# Patient Record
Sex: Female | Born: 1985 | Race: White | Hispanic: No | Marital: Single | Smoking: Never smoker
Health system: Southern US, Community
[De-identification: ages and names within clinical notes are randomized; demographics above are authoritative.]

## PROBLEM LIST (undated history)

## (undated) DIAGNOSIS — K219 Gastro-esophageal reflux disease without esophagitis: Secondary | ICD-10-CM

## (undated) DIAGNOSIS — M542 Cervicalgia: Secondary | ICD-10-CM

## (undated) DIAGNOSIS — M5126 Other intervertebral disc displacement, lumbar region: Secondary | ICD-10-CM

## (undated) HISTORY — PX: WISDOM TOOTH EXTRACTION: SHX21

## (undated) HISTORY — DX: Other intervertebral disc displacement, lumbar region: M51.26

## (undated) HISTORY — DX: Cervicalgia: M54.2

---
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
## 2015-05-29 DIAGNOSIS — M5126 Other intervertebral disc displacement, lumbar region: Secondary | ICD-10-CM

## 2015-05-29 DIAGNOSIS — M542 Cervicalgia: Secondary | ICD-10-CM

## 2015-05-29 HISTORY — DX: Cervicalgia: M54.2

## 2015-05-29 HISTORY — DX: Other intervertebral disc displacement, lumbar region: M51.26

## 2016-10-08 ENCOUNTER — Encounter: Payer: Self-pay | Admitting: Nurse Practitioner

## 2016-10-11 ENCOUNTER — Emergency Department (HOSPITAL_COMMUNITY): Payer: BLUE CROSS/BLUE SHIELD

## 2016-10-11 ENCOUNTER — Encounter (HOSPITAL_COMMUNITY): Payer: Self-pay | Admitting: Emergency Medicine

## 2016-10-11 DIAGNOSIS — R1013 Epigastric pain: Secondary | ICD-10-CM | POA: Diagnosis present

## 2016-10-11 DIAGNOSIS — K210 Gastro-esophageal reflux disease with esophagitis: Secondary | ICD-10-CM | POA: Insufficient documentation

## 2016-10-11 LAB — CBC
HCT: 39.4 % (ref 36.0–46.0)
Hemoglobin: 13.4 g/dL (ref 12.0–15.0)
MCH: 29.3 pg (ref 26.0–34.0)
MCHC: 34 g/dL (ref 30.0–36.0)
MCV: 86.2 fL (ref 78.0–100.0)
PLATELETS: 302 10*3/uL (ref 150–400)
RBC: 4.57 MIL/uL (ref 3.87–5.11)
RDW: 13.3 % (ref 11.5–15.5)
WBC: 8.4 10*3/uL (ref 4.0–10.5)

## 2016-10-11 LAB — BASIC METABOLIC PANEL
Anion gap: 9 (ref 5–15)
BUN: 12 mg/dL (ref 6–20)
CO2: 23 mmol/L (ref 22–32)
CREATININE: 0.92 mg/dL (ref 0.44–1.00)
Calcium: 8.9 mg/dL (ref 8.9–10.3)
Chloride: 102 mmol/L (ref 101–111)
GFR calc non Af Amer: >60 mL/min (ref >60)
Glucose, Bld: 80 mg/dL (ref 65–99)
Potassium: 4.6 mmol/L (ref 3.5–5.1)
SODIUM: 134 mmol/L — AB (ref 135–145)

## 2016-10-11 LAB — I-STAT TROPONIN, ED: TROPONIN I, POC: 0 ng/mL (ref 0.00–0.08)

## 2016-10-11 LAB — I-STAT BETA HCG BLOOD, ED (MC, WL, AP ONLY): I-stat hCG, quantitative: <5 m[IU]/mL (ref <5)

## 2016-10-11 NOTE — ED Triage Notes (Signed)
Pt reports having onset of chest pain that started 10/08/16 and has progressed since then. Pt also reports shortness of breath with light exertion. Pt does report to being on birth control at this time.

## 2016-10-12 ENCOUNTER — Encounter (HOSPITAL_COMMUNITY): Payer: Self-pay

## 2016-10-12 ENCOUNTER — Emergency Department (HOSPITAL_COMMUNITY): Payer: BLUE CROSS/BLUE SHIELD

## 2016-10-12 ENCOUNTER — Emergency Department (HOSPITAL_COMMUNITY)
Admission: EM | Admit: 2016-10-12 | Discharge: 2016-10-12 | Disposition: A | Discharge status: Home / Self Care | Payer: BLUE CROSS/BLUE SHIELD | Attending: Emergency Medicine | Admitting: Emergency Medicine

## 2016-10-12 DIAGNOSIS — K2100 Gastro-esophageal reflux disease with esophagitis, without bleeding: Secondary | ICD-10-CM

## 2016-10-12 HISTORY — DX: Other intervertebral disc displacement, lumbar region: M51.26

## 2016-10-12 MED ORDER — IOPAMIDOL (ISOVUE-370) INJECTION 76%
100.0000 mL | Freq: Once | INTRAVENOUS | Status: AC | PRN
  Administered 2016-10-12: 80 mL via INTRAVENOUS

## 2016-10-12 MED ORDER — GI COCKTAIL ~~LOC~~
30.0000 mL | Freq: Once | ORAL | Status: AC
  Administered 2016-10-12: 30 mL via ORAL
  Filled 2016-10-12: qty 30

## 2016-10-12 MED ORDER — IOPAMIDOL (ISOVUE-370) INJECTION 76%
INTRAVENOUS | Status: AC
  Administered 2016-10-12: 80 mL via INTRAVENOUS
  Filled 2016-10-12: qty 100

## 2016-10-12 NOTE — ED Notes (Signed)
Patient is alert and oriented x3.  She was given DC instructions and follow up visit instructions.  Patient gave verbal understanding. She was DC ambulatory under her own power to home.  V/S stable.  He was not showing any signs of distress on DC 

## 2016-10-12 NOTE — ED Provider Notes (Signed)
[facility]-EMERGENCY DEPT Provider Note   CSN: [id number] Arrival date & time: 10/11/16  1952  By signing my name below, I, Octavia Heir, attest that this documentation has been prepared under the direction and in the presence of Karmen Altamirano, MD.  Electronically Signed: Octavia Heir, ED Scribe. 10/12/16. 2:29 AM.    History   Chief Complaint Chief Complaint  Patient presents with  . Chest Pain   The history is provided by the patient. No language interpreter was used.  Chest Pain   This is a new problem. The current episode started more than 2 days ago (4 days). The problem occurs constantly. The problem has not changed since onset.The pain is associated with exertion and walking. The pain is present in the epigastric region and lateral region. The pain is moderate. The quality of the pain is described as dull. The pain does not radiate. The symptoms are aggravated by certain positions and exertion. Pertinent negatives include no abdominal pain, no back pain, no claudication, no cough, no diaphoresis, no dizziness, no exertional chest pressure, no fever, no headaches, no hemoptysis, no irregular heartbeat, no leg pain, no lower extremity edema, no malaise/fatigue, no nausea, no near-syncope, no numbness, no orthopnea, no palpitations, no PND, no shortness of breath, no sputum production, no syncope, no vomiting and no weakness. The treatment provided no relief. Risk factors include oral contraceptive use.  Pertinent negatives for past medical history include no Marfan's syndrome and no MI.  Pertinent negatives for family medical history include: no early MI.  Procedure history is negative for stress echo.   HPI Comments: Samantha Holder is a 31 y.o. female who presents to the Emergency Department complaining of moderate, progressively worsening, upper chest pain x 4 days. She reports associated shortness of breath upon exertion. Pt further notes she feels as if there is something "stuck in her  throat". Pt was seen by [facility] on Friday for her symptoms and was prescribed Omeprazole and Carafate. Pt started the Carafate Friday but states she started the Omeprazole today. She states that laying down modifies her symptoms while ambulating and sitting up aggravates her pain. Pt notes she does not have a PMhx of hiatal hernia or GERD, but she does note that she is on medication for GERD. Pt is currently on birth control. She denies fever, leg pain, leg swelling. Pt has no other complaints.   Past Medical History:  Diagnosis Date  . Lumbar herniated disc     There are no active problems to display for this patient.   History reviewed. No pertinent surgical history.  OB History    No data available       Home Medications    Prior to Admission medications   Not on File    Family History History reviewed. No pertinent family history.  Social History Social History  Substance Use Topics  . Smoking status: Never Smoker  . Smokeless tobacco: Never Used  . Alcohol use Yes     Allergies   Patient has no known allergies.   Review of Systems Review of Systems  Constitutional: Negative for diaphoresis, fever and malaise/fatigue.  Respiratory: Negative for cough, hemoptysis, sputum production and shortness of breath.   Cardiovascular: Positive for chest pain. Negative for palpitations, orthopnea, claudication, leg swelling, syncope, PND and near-syncope.  Gastrointestinal: Negative for abdominal pain, nausea and vomiting.  Musculoskeletal: Negative for back pain.  Neurological: Negative for dizziness, weakness, numbness and headaches.  All other systems reviewed and are negative.  Physical Exam Updated Vital Signs BP 112/79 (BP Location: Right Arm)   Pulse 82   Temp 97.4 F (36.3 C) (Oral)   Resp 18   Ht  (1.6 m)   Wt 135 lb (61.2 kg)   LMP 09/27/2016   SpO2 98%   BMI 23.91 kg/m   Physical Exam  Constitutional: She is oriented to person, place, and time.  She appears well-developed and well-nourished.  HENT:  Head: Normocephalic and atraumatic.  Mouth/Throat: Oropharynx is clear and moist. No oropharyngeal exudate.  Moist mucous membranes. No exudates.   Eyes: Conjunctivae and EOM are normal. Pupils are equal, round, and reactive to light.  Neck: Normal range of motion. Neck supple. No JVD present. No tracheal deviation present.  No carotid bruits. Trachea midline.   Cardiovascular: Normal rate, regular rhythm, normal heart sounds and intact distal pulses.  Exam reveals no gallop and no friction rub.   No murmur heard. RRR.   Pulmonary/Chest: Effort normal and breath sounds normal. No stridor. No respiratory distress. She has no wheezes. She has no rales.  Lungs CTA bilaterally. No cords.  Abdominal: Soft. Bowel sounds are normal. She exhibits no distension. There is no rebound and no guarding.  Musculoskeletal: Normal range of motion. She exhibits no edema or tenderness.  Lymphadenopathy:    She has no cervical adenopathy.  Neurological: She is alert and oriented to person, place, and time. She has normal reflexes. She displays normal reflexes.  Skin: Skin is warm and dry.  Psychiatric: She has a normal mood and affect.  Nursing note and vitals reviewed.    ED Treatments / Results   Vitals:   10/11/16 2005 10/12/16 0151  BP: 107/76 112/79  Pulse: 85 82  Resp: 16 18  Temp: 97.4 F (36.3 C)     DIAGNOSTIC STUDIES: Oxygen Saturation is 98% on RA, normal by my interpretation.  COORDINATION OF CARE:  2:29 AM Discussed treatment plan with pt at bedside and pt agreed to plan.  Labs (all labs ordered are listed, but only abnormal results are displayed)  Results for orders placed or performed during the hospital encounter of 10/12/16  Basic metabolic panel  Result Value Ref Range   Sodium 134 (L) 135 - 145 mmol/L   Potassium 4.6 3.5 - 5.1 mmol/L   Chloride 102 101 - 111 mmol/L   CO2 23 22 - 32 mmol/L   Glucose, Bld 80 65 -  99 mg/dL   BUN 12 6 - 20 mg/dL   Creatinine, Ser 9.14 0.44 - 1.00 mg/dL   Calcium 8.9 8.9 - 78.2 mg/dL   GFR calc non Af Amer >60 >60 mL/min   GFR calc Af Amer >60 >60 mL/min   Anion gap 9 5 - 15  CBC  Result Value Ref Range   WBC 8.4 4.0 - 10.5 K/uL   RBC 4.57 3.87 - 5.11 MIL/uL   Hemoglobin 13.4 12.0 - 15.0 g/dL   HCT 95.6 21.3 - 08.6 %   MCV 86.2 78.0 - 100.0 fL   MCH 29.3 26.0 - 34.0 pg   MCHC 34.0 30.0 - 36.0 g/dL   RDW 57.8 46.9 - 62.9 %   Platelets 302 150 - 400 K/uL  I-stat troponin, ED  Result Value Ref Range   Troponin i, poc 0.00 0.00 - 0.08 ng/mL   Comment 3          I-Stat Beta hCG blood, ED ([facility], [facility], [facility] only)  Result Value Ref Range   I-stat  hCG, quantitative <5.0 <5 mIU/mL   Comment 3           Dg Chest 2 View  Result Date: 10/11/2016 CLINICAL DATA:  Progressively worse generalized chest pain EXAM: CHEST  2 VIEW COMPARISON:  None. FINDINGS: The heart size and mediastinal contours are within normal limits. Both lungs are clear. Minimal scoliosis. IMPRESSION: No active cardiopulmonary disease. Electronically Signed   By: Jasmine Pang M.D.   On: 10/11/2016 20:53   Ct Angio Chest Pe W And/or Wo Contrast  Result Date: 10/12/2016 CLINICAL DATA:  Upper chest pain for 4 days. Shortness of breath with exertion. Patient is on birth control pills. Recent long car trip. EXAM: CT ANGIOGRAPHY CHEST WITH CONTRAST TECHNIQUE: Multidetector CT imaging of the chest was performed using the standard protocol during bolus administration of intravenous contrast. Multiplanar CT image reconstructions and MIPs were obtained to evaluate the vascular anatomy. CONTRAST:  80 mL Isovue 370 COMPARISON:  None. FINDINGS: Cardiovascular: Satisfactory opacification of the pulmonary arteries to the segmental level. No evidence of pulmonary embolism. Normal heart size. No pericardial effusion. Normal caliber thoracic aorta. No dissection. Mediastinum/Nodes: No enlarged mediastinal, hilar, or axillary lymph  nodes. Thyroid gland, trachea, and esophagus demonstrate no significant findings. Lungs/Pleura: Lungs are clear. No pleural effusion or pneumothorax. Upper Abdomen: No acute abnormality. Musculoskeletal: No acute bony abnormalities. Benign-appearing sclerosis in a lower thoracic vertebra. Review of the MIP images confirms the above findings. IMPRESSION: No evidence of significant pulmonary embolus. No evidence of active pulmonary disease. Electronically Signed   By: Burman Nieves M.D.   On: 10/12/2016 04:40    EKG  EKG Interpretation  Date/Time:  Monday Jerard Bays 16 2018 20:06:14 EDT Ventricular Rate:  73 PR Interval:    QRS Duration: 89 QT Interval:  376 QTC Calculation: 415 R Axis:   84 Text Interpretation:  Sinus rhythm Confirmed by [facility]  MD, Shianne Zeiser ([id number]) on 10/12/2016 2:00:27 AM       Radiology Dg Chest 2 View  Result Date: 10/11/2016 CLINICAL DATA:  Progressively worse generalized chest pain EXAM: CHEST  2 VIEW COMPARISON:  None. FINDINGS: The heart size and mediastinal contours are within normal limits. Both lungs are clear. Minimal scoliosis. IMPRESSION: No active cardiopulmonary disease. Electronically Signed   By: Jasmine Pang M.D.   On: 10/11/2016 20:53    Procedures Procedures (including critical care time)  Medications Ordered in ED Medications  gi cocktail (Maalox,Lidocaine,Donnatal) (30 mLs Oral Given 10/12/16 0349)  iopamidol (ISOVUE-370) 76 % injection 100 mL (80 mLs Intravenous Contrast Given 10/12/16 0418)     Final Clinical Impressions(s) / ED Diagnoses   Final diagnoses:  None   This is a 31 y.o. -year-old female presents with chest pain and sensation that something is stuck in the throat.  The patient is nontoxic-appearing on exam and vital signs are within normal limits.  Ruled out for MI in the ED with heart score of 0 and ruled out for PE in the ED.   I suspect GERD with esophagitis.  Already on carafate, and just started on omeprazole.  Ruled out  for MI in the Ed with 2 negative troponins in the setting of ongoing symptoms.  Exam, labs and vitals are benign and reassuring. The patient is nontoxic-appearing and imaging is negative for acute finding. Return for DOE, SOB at rest, diaphoresis,  intractable pain, vomiting, or any concerns.   After history, exam, and medical workup I feel the patient has been appropriately medically screened and is safe for  discharge home. Pertinent diagnoses were discussed with the patient. Patient was given return precautions.   I personally performed the services described in this documentation, which was scribed in my presence. The recorded information has been reviewed and is accurate.      Cy Blamer, MD 10/12/16 [phone]

## 2016-10-19 ENCOUNTER — Ambulatory Visit (INDEPENDENT_AMBULATORY_CARE_PROVIDER_SITE_OTHER): Payer: BLUE CROSS/BLUE SHIELD | Admitting: Nurse Practitioner

## 2016-10-19 ENCOUNTER — Encounter: Payer: Self-pay | Admitting: Nurse Practitioner

## 2016-10-19 VITALS — BP 118/76 | HR 87 | Ht 63.0 in | Wt 136.6 lb

## 2016-10-19 DIAGNOSIS — R0789 Other chest pain: Secondary | ICD-10-CM

## 2016-10-19 DIAGNOSIS — K219 Gastro-esophageal reflux disease without esophagitis: Secondary | ICD-10-CM | POA: Diagnosis not present

## 2016-10-19 NOTE — Patient Instructions (Addendum)
If you are age 30 or older, your body mass index should be between 23-30. Your Body mass index is 24.2 kg/m. If this is out of the aforementioned range listed, please consider follow up with your Primary Care Provider.  If you are age 15 or younger, your body mass index should be between 19-25. Your Body mass index is 24.2 kg/m. If this is out of the aformentioned range listed, please consider follow up with your Primary Care Provider.   Continue current medications.  If chest tightness persist after 7-10 days, increase Omeprazole to twice daily before meals.  You have been given GERD literature.  Call with an update the first week of May.  Thank you for choosing me and [facility].  Charyl Dancer, NP

## 2016-10-19 NOTE — Progress Notes (Addendum)
HPI:  Samantha Holder is a 31 year old female, new to this practice, here for evaluation of GERD type symptoms. Two weeks ago she presented to the ED with complaints of nausea, chest tightness, mild SOB. She was given omeprazole once daily and Carafate 4 times a day. She returned a few days later when symptoms failed to improve. At the second visit patient was referred to the ED,  cardiac etiology excluded and patient was advised to see a gastroenterologist. ED CBC, chem profile were unremarkable. Normal troponin. CT Angio of the chest was normal.  No acute findings on EKG. She has been on the PPI + carafate for nearly 2 weeks and the nausea is better. She was having some dysphagia to water and medications but that is better as well. She still has chest tightness and feels like something is stuck in her throat. No heartburn.  She is eating and drinking, weight is stable. She has occasional nonproductive cough. Patient has no history of asthma or other pulmonary problems. Hasn't noticed any wheezing  Bernard does have occasional constipation. She recently added Benefiber to her diet   Past Medical History:  Diagnosis Date  . Lumbar herniated disc     Past Surgical History:  Procedure Laterality Date  . WISDOM TOOTH EXTRACTION     Family History  Problem Relation Age of Onset  . Breast cancer Maternal Grandmother    Social History  Substance Use Topics  . Smoking status: Never Smoker  . Smokeless tobacco: Never Used  . Alcohol use Yes   Current Outpatient Prescriptions  Medication Sig Dispense Refill  . Norgestimate-Ethinyl Estradiol Triphasic (TRI-SPRINTEC) 0.18/0.215/0.25 MG-35 MCG tablet Take 1 tablet by mouth daily.    Marland Kitchen omeprazole (PRILOSEC) 20 MG capsule Take 20 mg by mouth daily.    . sucralfate (CARAFATE) 1 g tablet Take 1 g by mouth 4 (four) times daily.     No current facility-administered medications for this visit.    No Known Allergies   Review of Systems: All systems  reviewed and negative except where noted in HPI.    Physical Exam: BP 118/76   Pulse 87   Ht  (1.6 m)   Wt 136 lb 9.6 oz (62 kg)   LMP 09/27/2016 Comment: negative beta HCG 10/11/16  SpO2 98%   BMI 24.20 kg/m  Constitutional:  Well-developed, white female in no acute distress. Psychiatric: Normal mood and affect. Behavior is normal. HEENT: Normocephalic and atraumatic. Conjunctivae are normal. No scleral icterus. Neck supple.  Cardiovascular: Normal rate, regular rhythm.  Pulmonary/chest: Effort normal and breath sounds normal. No wheezing, rales or rhonchi. Abdominal: Soft, nondistended, nontender. Bowel sounds active throughout. There are no masses palpable. No hepatomegaly. Extremities: no edema Lymphadenopathy: No cervical adenopathy noted. Neurological: Alert and oriented to person place and time. Skin: Skin is warm and dry. No rashes noted.   ASSESSMENT AND PLAN:  21. 31 year old female with a 3 week history of chest pressure/tightness, globus, and mild dysphagia to water and medication. Seen in ED, labs and CTA chest negative. With exception of chest tightness her symptoms have improved on once daily PPI and 3 times a day Carafate. Chest tightness is worse with eating but also exercise. Her symptoms may be GERD related. No history of asthma or other pulmonary disease,   -We discussed options such as watch and wait on medication approach for a couple more weeks versus barium swallow to rule out any strictures vrs  EGD. Patient has only catastrophic  insurance until July 1 so would like to hold off on any procedures if possible. Unfortunately she is moving to [place] the end of May however -Plan is to continue PPI and Carafate for now. Patient will call me if symptoms acutely worsen. Otherwise she will call me the first week of May with a condition update.   2. Mild constipation. -Recently started Benefiber, recommend she continue it on a daily basis  Willette Cluster, NP   10/19/2016, 8:59 AM   Addendum: Reviewed and agree with initial management. Beverley Fiedler, MD

## 2017-02-17 ENCOUNTER — Encounter: Payer: Self-pay | Admitting: Family Medicine

## 2017-02-17 NOTE — Progress Notes (Unsigned)
Nothing found in Care Everywhere. I placed a call to the patient, used 3 identifiers.  Patient will to bring all medications, vitamins and herbal supplements that they are taking to your appointment.  Leroy Libman, LVN

## 2017-02-21 ENCOUNTER — Encounter: Payer: Self-pay | Admitting: Family Medicine

## 2017-02-21 ENCOUNTER — Ambulatory Visit: Payer: Commercial Managed Care - HMO | Admitting: Family Medicine

## 2017-02-21 ENCOUNTER — Ambulatory Visit: Payer: Commercial Managed Care - HMO

## 2017-02-21 VITALS — BP 90/66 | HR 94 | Temp 97.1°F | Ht 63.0 in | Wt 134.7 lb

## 2017-02-21 DIAGNOSIS — Z3041 Encounter for surveillance of contraceptive pills: Secondary | ICD-10-CM

## 2017-02-21 DIAGNOSIS — K2950 Unspecified chronic gastritis without bleeding: Secondary | ICD-10-CM | POA: Insufficient documentation

## 2017-02-21 MED ORDER — NORGESTIMATE-ETHINYL ESTRADIOL 0.18MG/0.215MG/0.25MG-0.035MG(28)TABLET
1.0000 | ORAL_TABLET | Freq: Every day | ORAL | 6 refills | Status: DC
Start: 2017-02-21 — End: 2017-08-22

## 2017-02-21 MED ORDER — OMEPRAZOLE 20 MG CAPSULE,DELAYED RELEASE
20.0000 mg | DELAYED_RELEASE_CAPSULE | Freq: Every day | ORAL | 1 refills | Status: AC
Start: 2017-02-21 — End: 2017-03-23

## 2017-02-21 NOTE — Patient Instructions (Addendum)
H. Pylori: About This Test  What is it?  H. pylori tests are used to check for a Helicobacter pylori bacteria infection in the stomach and upper part of the small intestine. H. pylori can cause peptic ulcers. But most people with this type of bacteria in their digestive systems do not get ulcers.  Different tests may be used to check for an H. pylori infection.   Blood antibody test. This checks to see if your body has made antibodies to fight H. pylori bacteria.   Urea breath test. It tests your breath to see if you have H. pylori bacteria in your stomach.   Stool antigen test. This test looks for substances in your feces (stool) that trigger the immune system to fight an H. pylori infection. (These substances are called H. pylori antigens.)   Stomach biopsy. A small sample (biopsy) is taken from the lining of your stomach and small intestine. The samples are checked for H. pylori.  Why is this test done?  H. pylori tests are done to:   Find out if an infection with H. pylori bacteria may be causing an ulcer or irritation of the stomach lining (gastritis).   Find out if treatment for the infection has been successful.  How can you prepare for the test?  Blood antibody test   You do not need to do anything before you have this test.  Urea breath test, stool antigen test, or stomach biopsy   Medicines you take may change the results of these tests. Be sure to tell your doctor about all the prescription and over-the-counter medicines you take. Your doctor may advise you to stop taking some of your medicines.  Urea breath test or stomach biopsy   You will be asked to not eat or drink anything for a certain amount of time before your breath test or stomach biopsy. Follow your doctor's instructions about how long you need to avoid eating and drinking before the test. If you are going to have a stomach biopsy, your doctor will give you instructions on how to prepare.  What happens during the test?  Blood  antibody test   A health professional takes a sample of your blood.  Urea breath test  A breath sample is collected when you blow into a balloon or blow bubbles into a bottle of liquid. The health professional will:   Collect a sample of your breath before the test starts.   Give you a capsule or some water to swallow that contains tagged or radioactive material.   Collect more samples of your breath. The samples will be tested to see if they contain material formed when H. pylori comes into contact with the tagged or radioactive material.  Stool antigen test  For this test, you may be asked to collect the stool sample at home. To collect the sample, you need to:   Pass stool into a dry container. Either solid or liquid stools can be collected. Be careful not to get urine or toilet tissue in with the stool sample.   Replace the container cap. Label the container with your name, your doctor's name, and the date the sample was collected.   Wash your hands well after you collect the sample.   Take the sealed container to your doctor's office or to the lab as soon as you can.  Stomach biopsy   A procedure called endoscopy is used to collect samples of tissue from the stomach and the first part of   the small intestine. The tissue samples are tested in a lab to see if they contain H. pylori.  Follow-up care is a key part of your treatment and safety. Be sure to make and go to all appointments, and call your doctor if you are having problems. It's also a good idea to keep a list of the medicines you take. Ask your doctor when you can expect to have your test results.  Where can you learn more?  Go to https://www.healthwise.net/patientEd.  Enter L549 in the search box to learn more about "H. Pylori: About This Test."  Current as of: April 11, 2015  Content Version: 11.4   2006-2017 [employer]. Care instructions adapted under license by your healthcare professional. If you have questions about a  medical condition or this instruction, always ask your healthcare professional. Healthwise, Inc[employer] disclaims any warranty or liability for your use of this information.

## 2017-02-21 NOTE — Progress Notes (Signed)
CC: "I need GI referral for endoscopy"    HPI: C/o frequent eructations for about 2 years, substernal tightness and irritation. Previously unrelated to diet and timing with eating/drinking. Eating smaller meals reduces this pain/sensation. Now is noticing chocolate, coffee make symptoms worse. Denies history of esophageal or stomach burning, N/V/D. Has raised head of bed and avoids eating 5 hours before bed. With modifications, symptoms have improved, but still bothersome.     Had one episode of throat tightening sensation while eating an apple a few months ago. Next morning went to urgent care and started on sucralfate and PPI. Later in evening had exertional dyspnea and chest pressure. 2 days later went to urgent care and got EKG> ER and "all ruled out"  GI consult subsequently, told to stay on meds for 1 month and consider EGD when insurance changes.  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     Review of Systems   Constitutional: Negative for fever and weight loss.   HENT: Negative for sore throat.    Respiratory: Negative for cough and shortness of breath.    Cardiovascular: Negative for chest pain and palpitations.   Gastrointestinal: Positive for heartburn. Negative for abdominal pain, blood in stool, diarrhea, nausea and vomiting.       OBJECTIVE  Temp: 36.2 C (97.1 F) (08/27 1505)  Temp src: Tympanic (08/27 1505)  Pulse: 94 (08/27 1505)  BP: 90/66 (08/27 1505)  Resp: --  SpO2: 96 % (08/27 1505)  Height: 160 cm (5\' 3" ) (08/27 1505)  Weight: 61.1 kg (134 lb 11.2 oz) (08/27 1505)  General: Well-developed, well-nourished, no acute distress  HEENT: NCAT, PERRL, EOMI, dentition is in good repair, moist mucous membranes, oropharynx clear, no difficulty swallowing, no obvious facial deformities  Neck: Supple, no cervical lymphadenopathy, no thyroid enlargement  CV: RRR, normal S1 S2, no murmur appreciated  Resp: lungs clear to auscultation bilaterally, no w/r/r,  breathing comfortably on room air  Abd: soft, non-tender, non-distended, normoactive bowel sounds  Ext: no deformities, no edema  Skin: warm and dry  Neuro: A&Ox4    ASSESSMENT/PLAN  Lesile Niederer is a 31yr old female with the following assessments addressed today:  1. Chronic gastritis, presence of bleeding unspecified, unspecified gastritis type  - Omeprazole (PRILOSEC) 20 mg Delayed Release Capsule; Take 1 capsule by mouth once daily before a meal.  Dispense: 30 capsule; Refill: 1  - HELICOBACTER PYLORI ANTIGEN; Future  - We will wait for results and if negative send GI referral.  If positive will treat.   2. Encounter for surveillance of contraceptive pills  - Counseled patient to establish care with new PCP. Obtain records from Completed one year ago   - Norgestimate-Ethinyl Estradiol (ORTHO TRI-CYCLEN) 0.18/0.215/0.25 mg-35 mcg (28) Tablet; Take 1 tablet by mouth every morning.  Dispense: 28 tablet; Refill: 6    Patient endorsed understanding and is in agreement with plan of care.    Follow-up as needed    Zada Girt, DO  Associate Physician  [facility] Psychiat[facility]    This note was created using the support of [employer]. Please note any grammatical, sound-alike, or syntax errors as likely dictation errors.

## 2017-02-21 NOTE — Nursing Note (Signed)
Patient ID confirmed using 2 identifiers.    Vital signs taken, allergies verified, screened for pain, and med history taken.      Mammography screening for females between 50-30 years of age reviewed as appropriate.    Flu Vaccine status reviewed and updated as appropriate.     My Chart status reviewed and updated as appropriate.    I have asked the patient if they have received any medical treatment/consultations outside of the [facility] within the past 30 days. The patient has indicated no to receiving services outside of the [facility]. If yes, I have requested information to ascertain these results.     Sandralee Tarkington, MA

## 2017-04-19 ENCOUNTER — Other Ambulatory Visit: Payer: Self-pay | Admitting: Family Medicine

## 2017-04-19 DIAGNOSIS — K2950 Unspecified chronic gastritis without bleeding: Principal | ICD-10-CM

## 2017-04-19 NOTE — Telephone Encounter (Signed)
Patient last office visit on 02/21/17  Last filled 02/21/17

## 2017-04-20 NOTE — Telephone Encounter (Signed)
Left message on voicemail for patient to call back. 

## 2017-04-20 NOTE — Telephone Encounter (Signed)
Patient notified.   She still needs a refill for one month supply and she will do the test next week.

## 2017-04-20 NOTE — Telephone Encounter (Signed)
Patient needs to complete h.pylori testing for chronic gastritis first. We want to diagnose why you are having these symptoms.

## 2017-04-21 NOTE — Telephone Encounter (Signed)
Left message reiterating that it is necessary to complete h.pylori testing before continuing with omeprazole to make sure we are not doing more harm than good. Asked patient to complete lab and I will refill as indicated once result is available.   Patient was seen 2 mo prior for chronic gastritis on long-term PPI therapy without EGD or h.pylori testing. Patient's chief complaint at the time was to ask for GI referral for EGD. We cannot move forward without r/o h.pylori, and stool antigen testing is not reliable without a 2 week break from PPI. Additionally, long-term PPI without diagnosis is contraindicated in otherwise healthy patient and may impair nutrient absorption.

## 2017-06-02 ENCOUNTER — Ambulatory Visit: Payer: Commercial Managed Care - HMO | Attending: Family Medicine

## 2017-06-02 DIAGNOSIS — K2950 Unspecified chronic gastritis without bleeding: Principal | ICD-10-CM | POA: Insufficient documentation

## 2017-06-10 LAB — HELICOBACTER PYLORI ANTIGEN: HELICOBACTER PYLORI ANTIGEN: NEGATIVE

## 2017-06-13 ENCOUNTER — Telehealth: Payer: Self-pay | Admitting: Family Medicine

## 2017-06-13 DIAGNOSIS — K2950 Unspecified chronic gastritis without bleeding: Principal | ICD-10-CM

## 2017-06-13 NOTE — Telephone Encounter (Signed)
H.pylori negative. Chronic gastritis, will refer to GI for possible EGD as discussed at last visit

## 2017-06-14 NOTE — Telephone Encounter (Signed)
Left message on unidentifed voicemail for patient to call clinic back and ask to speak to the advise nurse to get the message from their doctor.    ATTN STAFF:  When patient calls back please transfer patient to advise nurse

## 2017-06-14 NOTE — Telephone Encounter (Signed)
3 patient identifiers used by [name] is a [age]yr old female    Disposition: Consult with MD  I reviewed results and message from MD with patient. Questions were answered and patient would like to know if the referral could be changed to the [facility] as the patient lives in that area.    Thank you  Ivor MessierLisa Attles Shaquala Broeker  PCN Triage Nurse

## 2017-07-28 ENCOUNTER — Telehealth: Payer: Self-pay | Admitting: GASTROENTEROLOGY

## 2017-07-28 NOTE — Telephone Encounter (Signed)
Pre procedure follow up call :  3 patient identifications obtained.  Patient confirmed appointment date and time and aware must have a driver to pick up after procedure. All questions answered and verbalized understanding. Informed  patient to call [facility] if any further questions between the hours of 8am to 4:30pm.    Janet Fernandez Senior LVN

## 2017-07-29 ENCOUNTER — Encounter: Payer: Self-pay | Admitting: Gastroenterology

## 2017-08-05 ENCOUNTER — Ambulatory Visit: Payer: Commercial Managed Care - HMO | Admitting: GASTROENTEROLOGY

## 2017-08-22 ENCOUNTER — Other Ambulatory Visit: Payer: Self-pay | Admitting: Family Medicine

## 2017-08-22 DIAGNOSIS — Z3041 Encounter for surveillance of contraceptive pills: Principal | ICD-10-CM

## 2017-08-22 NOTE — Telephone Encounter (Signed)
Pharmacy asking for 90 day refill.

## 2017-08-23 MED ORDER — NORGESTIMATE-ETHINYL ESTRADIOL 0.18MG/0.215MG/0.25MG-0.035MG(28)TABLET
1.0000 | ORAL_TABLET | Freq: Every day | ORAL | 0 refills | Status: DC
Start: 2017-08-23 — End: 2017-12-14

## 2017-08-24 ENCOUNTER — Telehealth: Payer: Self-pay | Admitting: GASTROENTEROLOGY

## 2017-08-24 NOTE — Telephone Encounter (Signed)
Pre procedure follow up call :  3 pt identifications verified , Pt confirmed appointment,  aware must have a driver  . All questions answered and verbalized understanding  about the procedure no further questions asked.  For any further questions,concerns, or need to cancel appointment advise to call back [phone] Monday to Friday 8am to 4pm .  After hours to call [phone] and ask for the GI Fellow on-call.    Ivanna Kocak, LVN  [facility]

## 2017-08-26 ENCOUNTER — Encounter: Payer: Self-pay | Admitting: GASTROENTEROLOGY

## 2017-08-26 ENCOUNTER — Ambulatory Visit
Admission: RE | Admit: 2017-08-26 | Discharge: 2017-08-26 | Disposition: A | Discharge status: Home / Self Care | Payer: Commercial Managed Care - HMO | Source: Ambulatory Visit | Attending: GASTROENTEROLOGY | Admitting: GASTROENTEROLOGY

## 2017-08-26 DIAGNOSIS — R1310 Dysphagia, unspecified: Principal | ICD-10-CM | POA: Insufficient documentation

## 2017-08-26 HISTORY — DX: Gastro-esophageal reflux disease without esophagitis: K21.9

## 2017-08-26 MED ORDER — PROMETHAZINE 25 MG/ML INJECTION SOLUTION
6.2500 mg | INTRAMUSCULAR | Status: AC | PRN
Start: 2017-08-26 — End: 2017-08-26
  Administered 2017-08-26: 6.25 mg via INTRAVENOUS
  Filled 2017-08-26: qty 1

## 2017-08-26 MED ORDER — FENTANYL (PF) 50 MCG/ML INJECTION SOLUTION
12.5000 ug | INTRAMUSCULAR | Status: DC | PRN
Start: 2017-08-26 — End: 2017-09-01
  Administered 2017-08-26: 100 ug via INTRAVENOUS
  Filled 2017-08-26: qty 4

## 2017-08-26 MED ORDER — EPINEPHRINE 0.1 MG/ML INJECTION SYRINGE
1.0000 mg | INJECTION | INTRAMUSCULAR | Status: DC | PRN
Start: 2017-08-26 — End: 2017-09-01

## 2017-08-26 MED ORDER — FLUMAZENIL 0.1 MG/ML INTRAVENOUS SOLUTION
0.2000 mg | INTRAVENOUS | Status: DC | PRN
Start: 2017-08-26 — End: 2017-09-01

## 2017-08-26 MED ORDER — MIDAZOLAM (PF) 1 MG/ML INJECTION SOLUTION
0.5000 mg | INTRAMUSCULAR | Status: DC | PRN
Start: 2017-08-26 — End: 2017-09-01
  Administered 2017-08-26: 5 mg via INTRAVENOUS
  Filled 2017-08-26: qty 10

## 2017-08-26 MED ORDER — EPINEPHRINE 0.1 MG/ML INJECTION SYRINGE
0.3000 mg | INJECTION | INTRAMUSCULAR | Status: DC | PRN
Start: 2017-08-26 — End: 2017-09-01

## 2017-08-26 MED ORDER — NACL 0.9% IV INFUSION
INTRAVENOUS | Status: DC
Start: 2017-08-26 — End: 2017-09-01
  Administered 2017-08-26: 16:00:00 via INTRAVENOUS

## 2017-08-26 MED ORDER — LIDOCAINE HCL 2 % MUCOSAL SOLUTION
5.0000 mL | Freq: Once | Status: AC
Start: 2017-08-26 — End: 2017-08-26
  Administered 2017-08-26: 5 mL via OROMUCOSAL
  Filled 2017-08-26: qty 15

## 2017-08-26 MED ORDER — ATROPINE 0.1 MG/ML INJECTION SYRINGE
0.5000 mg | INJECTION | INTRAMUSCULAR | Status: DC | PRN
Start: 2017-08-26 — End: 2017-09-01

## 2017-08-26 MED ORDER — SIMETHICONE 40 MG/0.6 ML ORAL DROPS,SUSPENSION
40.0000 mg | Freq: Once | ORAL | Status: AC
Start: 2017-08-26 — End: 2017-08-26
  Administered 2017-08-26: 40 mg
  Filled 2017-08-26: qty 0.6

## 2017-08-26 MED ORDER — DIPHENHYDRAMINE 50 MG/ML INJECTION SOLUTION
12.5000 mg | INTRAMUSCULAR | Status: DC | PRN
Start: 2017-08-26 — End: 2017-09-01
  Filled 2017-08-26: qty 1

## 2017-08-26 MED ORDER — NALOXONE 0.4 MG/ML INJECTION SOLUTION
0.4000 mg | INTRAMUSCULAR | Status: DC | PRN
Start: 2017-08-26 — End: 2017-09-01

## 2017-08-26 NOTE — Discharge Instructions (Signed)
Discharge Instructions for upper endoscopy    Findings:  - Normal appearing esophagus without acid reflux changes. Biopsies obtained.   - Normal appearing stomach.  - Normal appearing duodenum.     Activity:    Rest today, resume usual activitiy tomorrow     Diet:    Resume usual diet:     Medication:    Resume all usual medications    Follow up:   We will notify you of your biopsy results via phone call or letter.If you do not receive notice of your results by three weeks, please call us at the phone number provided below.      During your procedure, air was pumped into your GI tract so your doctor could see clearly to make a diagnosis and/or treat your problem.     Some possible side effects you may experience are:   - Discomfort due to a distended (bloated) abdomen which will subside after a few hours to two days.   - Nausea may be a side effect of the medication and will subside.   - The medications you received may make you dizzy and sleepy, it is important that you do not drive, operate machinery or drink alcohol for at least one day.   - Severe pain is not expected and should be reported    Other side effects may include:  Upper Endoscopy: A sore throat can be helped by cough drops, or a salt-water gargle.    If problems, call [facility] [phone] during business hours Monday through Friday 7:30am to 4:30 pm.  After hours call [phone] and ask to speak to the GI Fellow on call.
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
## 2017-08-26 NOTE — Procedures (Signed)
Pre-Procedure Time Out Checklist  (do not remove)     Attestation:  ID verified by two sources (select any two from list): DOB and Name  Was this an emergency procedure?  no     I attest that I verified the following information prior to performing the procedure: Site and Procedure     Patient Name:    Janet Fernandez  MRN: [id number]   DOB: 12/22/1985  DOS: 08/26/2017      Procedure: Upper Endoscopy  Sub-procedure: Biopsy       Referring Physician: Emmit PomfretPierce, Laura Jeanne, DO    PERFORMING SURGEON: Phil DoppJoseph Amarius Toto, MD    ASSISTANT SURGEON: None     GI Pre-procedure Indications:  dysphagia      Sedation:   Sedation was administered for single procedure/procedures  Versed 5 mg IV, Fentanyl 100 mcg IV and Phenergan 6.25 mg IV     Sedation Start Time: 15:14    I was the responsible provider supervising the administration of moderate sedation to this patient by a trained independent provider (RN). I was present during the interservice time from initial administration of sedatives until the end of the procedure at the time noted above.      Procedure Time:  Time start: 15:33  Time end: 15:42      Details of the Procedure:    Informed consent was obtained for the procedure, including sedation. Risks of perforation, hemorrhage, adverse drug reaction, pain, infection and aspiration were discussed. The patient was placed in the left lateral decubitus position. Based on the pre-procedure assessment, including review of the patient's medical history, medications, allergies, and review of systems, the patient had been deemed to be an appropriate candidate for conscious sedation; the patient was therefore sedated with the medications listed below. The patient was monitored continuously with EKG tracing, pulse oximetry, blood pressure monitoring, and direct observations.     An oral examination was performed. The Olympus endoscope was inserted into the mouth and advanced under direct vision to the second portion of the duodenum.   A careful  inspection was made as the gastroscope was withdrawn, including a retroflexed view of the proximal stomach; findings and interventions are described below. Appropriate photo documentation was obtained. The patient tolerated the procedure well, and there were no complications. The patient was taken to the recovery area in stable condition.       Findings and Interventions:   Esophageal mucosa: Normal appearing mucosa s/p mid and distal biopsies to assess for EoE given dysphagia.   Esophageal-gastric junction localized at 38 cm.  Squamo-columnar junction regular  Hiatal hernia absent  Gastric mucosa: Bile present in the stomach but otherwise normal appearing mucosa.   Duodenal mucosa: Normal appearing mucosa.   Retroflexed view: Normal appearing mucosa.       Impression:   1. Normal appearing esophagus without reflux changes. S/p biopsies to assess for eosinophilic esophagitis.   2. Normal appearing stomach with bile present. Given presence of bile, symptoms of heartburn could be related to bile acid reflux.   3. Normal appearing duodenum.     Recommendation/Plan:   - Await pathology results  - Continue Omeprazole. Can consider adding liquid sucralfate 1 g three times daily if symptoms do not resolve with PPI.   - Follow up with referring provider.       Report Electronically Signed By:     Phil DoppJoseph Jamirra Curnow, MD  Assistant Professor   Division of Gastroenterology/Hepatology  Pager: [phone]  PI #: [id number]

## 2017-08-30 LAB — SURGICAL PATHOLOGY

## 2017-08-31 ENCOUNTER — Encounter: Payer: Self-pay | Admitting: GASTROENTEROLOGY

## 2017-12-14 ENCOUNTER — Other Ambulatory Visit: Payer: Self-pay | Admitting: Family Medicine

## 2017-12-14 DIAGNOSIS — Z3041 Encounter for surveillance of contraceptive pills: Principal | ICD-10-CM

## 2017-12-14 NOTE — Telephone Encounter (Signed)
Last RF 08/23/17  Last OV 02/21/17  Sherrie MustacheSharon Elverda Fernandez, RMA

## 2018-02-18 IMAGING — CT CT ANGIO CHEST
2 of 6 series · 19 of 36 positions shown · IV contrast (ISOVUE 370)
Comparison: None.

CLINICAL DATA: Upper chest pain for 4 days. Shortness of breath
with exertion. Patient is on birth control pills. Recent long car
trip.

EXAM:
CT ANGIOGRAPHY CHEST WITH CONTRAST
TECHNIQUE: Multidetector CT imaging of the chest was performed using the
standard protocol during bolus administration of intravenous
contrast. Multiplanar CT image reconstructions and MIPs were
obtained to evaluate the vascular anatomy.
CONTRAST:  80 mL Isovue 370

[Series 6: thins for pacs · axial · 0.65mm/px · z∈[+45,+248]mm · 18 of 227 slices shown]
[im 12/227  lung]
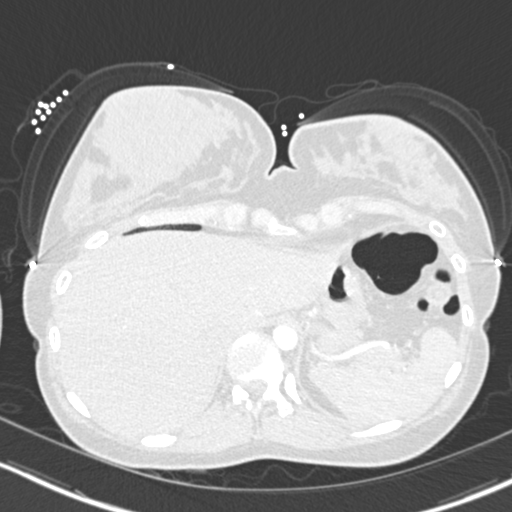
[im 23/227  mediastinal]
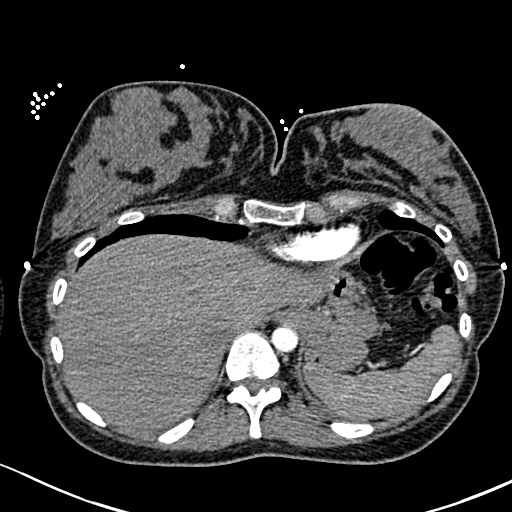
[im 34/227  lung]
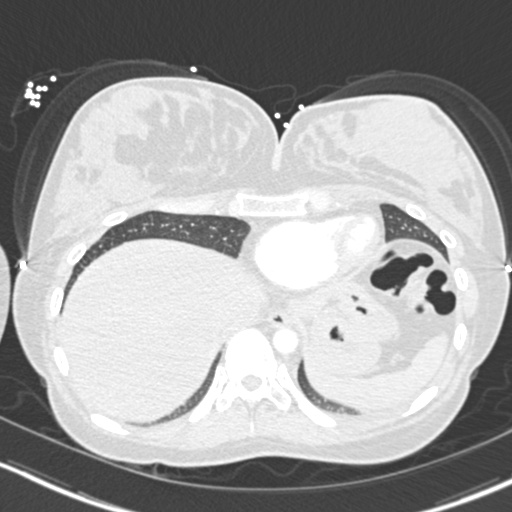
[im 46/227  mediastinal]
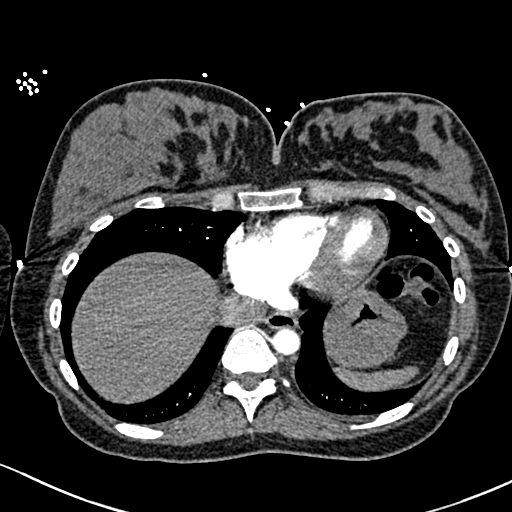
[im 57/227  lung]
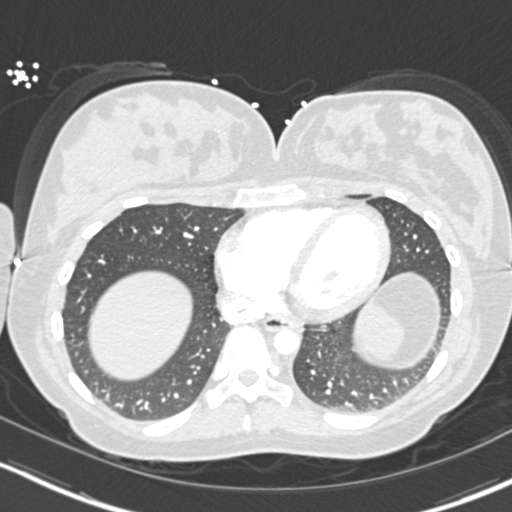
[im 68/227  mediastinal]
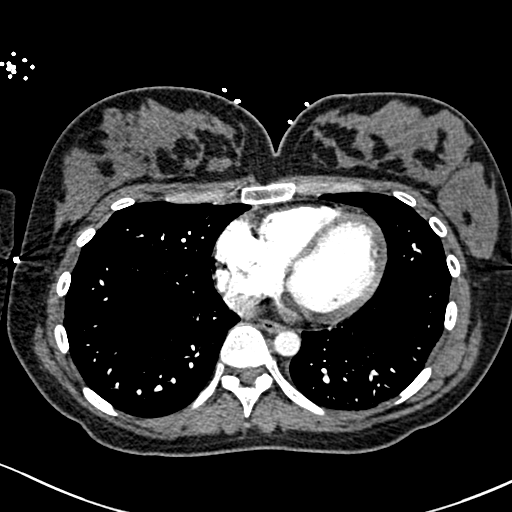
[im 80/227  lung]
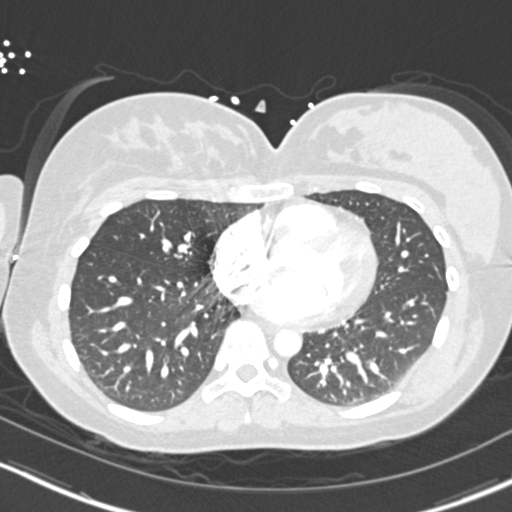
[im 91/227  mediastinal]
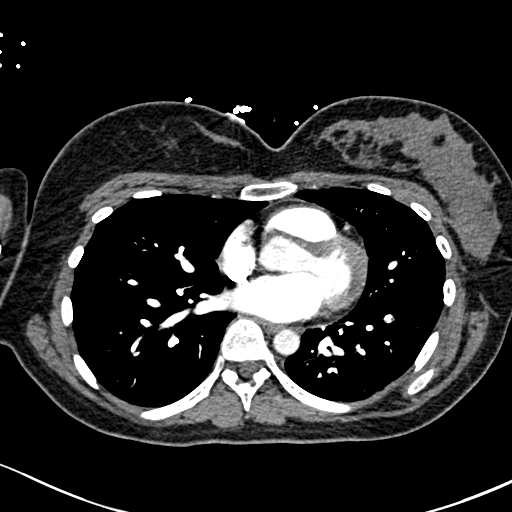
[im 102/227  lung]
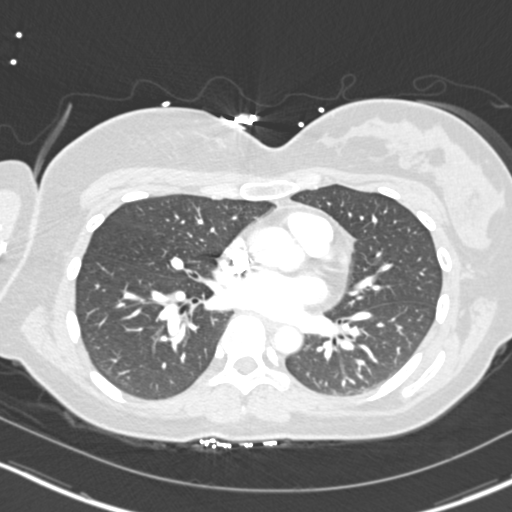
[im 125/227  mediastinal]
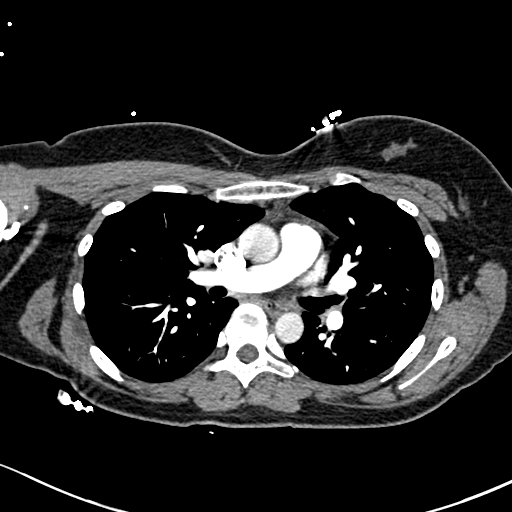
[im 136/227  lung]
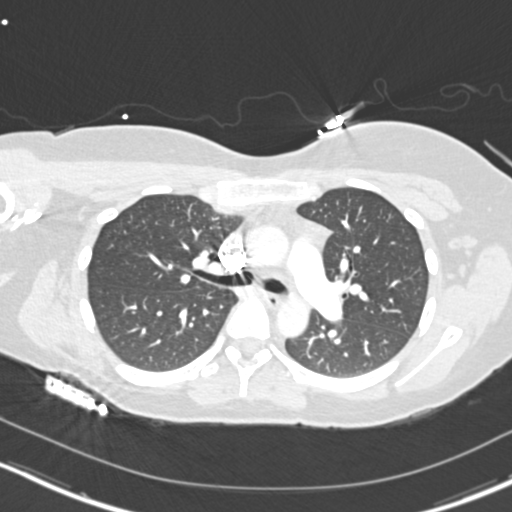
[im 147/227  mediastinal]
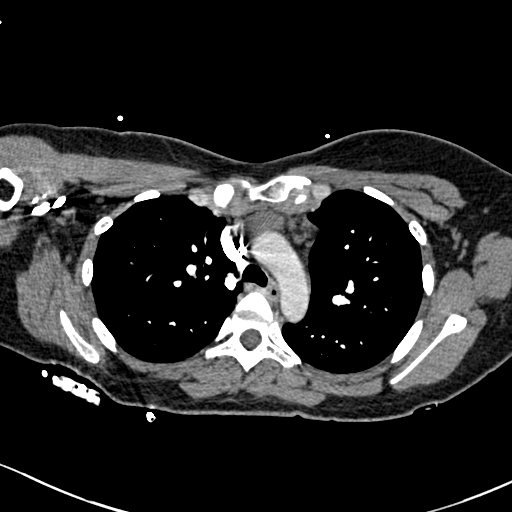
[im 159/227  lung]
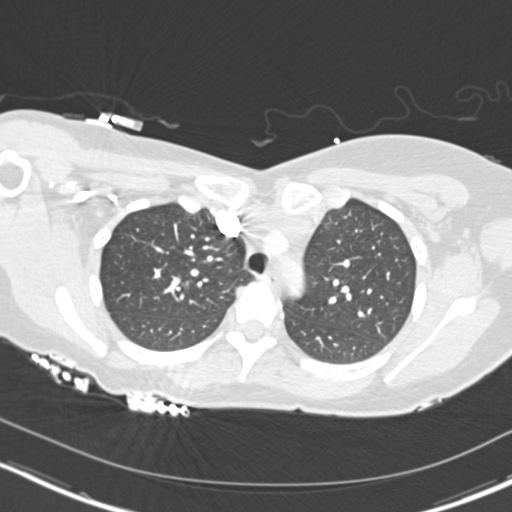
[im 170/227  mediastinal]
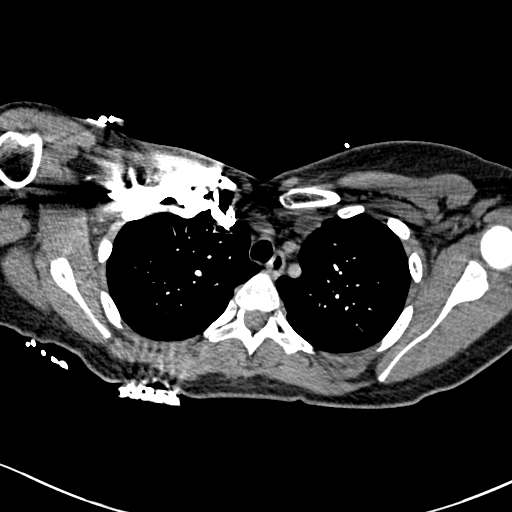
[im 181/227  lung]
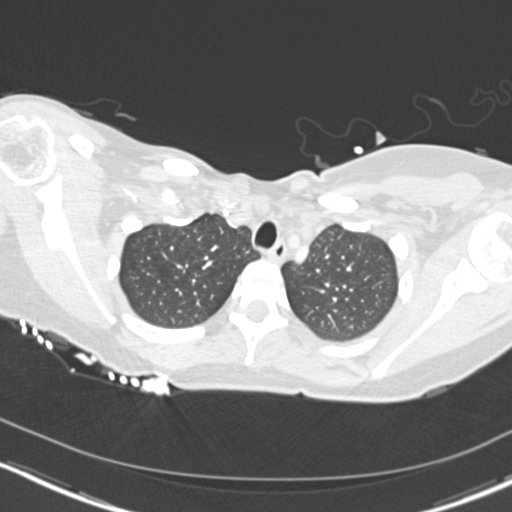
[im 193/227  mediastinal]
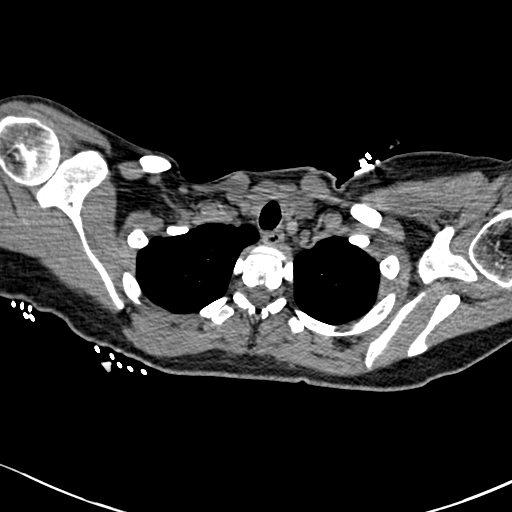
[im 204/227  lung]
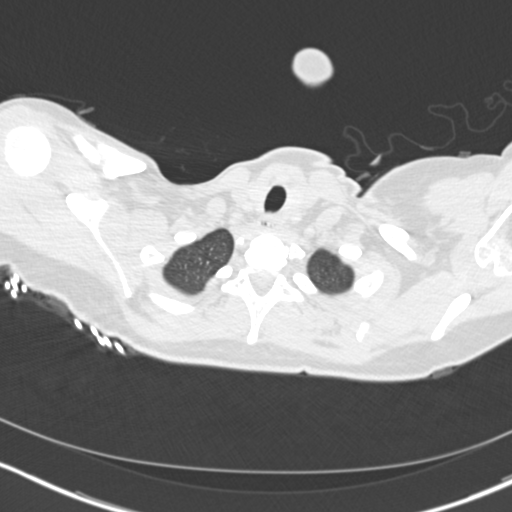
[im 215/227  mediastinal]
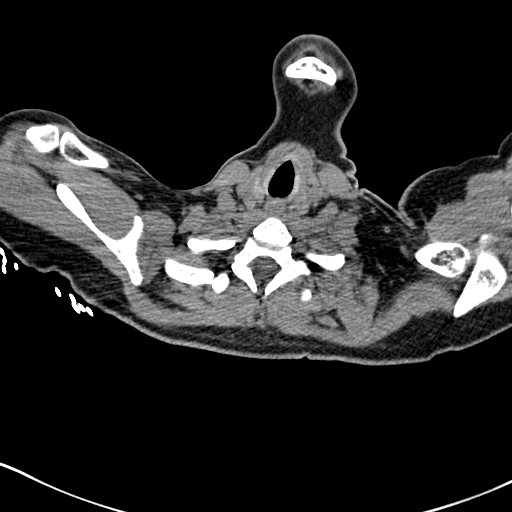

[Series 8: coronal mpr · coronal · 0.46mm/px · 1 of 94 slices shown]
[im 47/94  mediastinal]
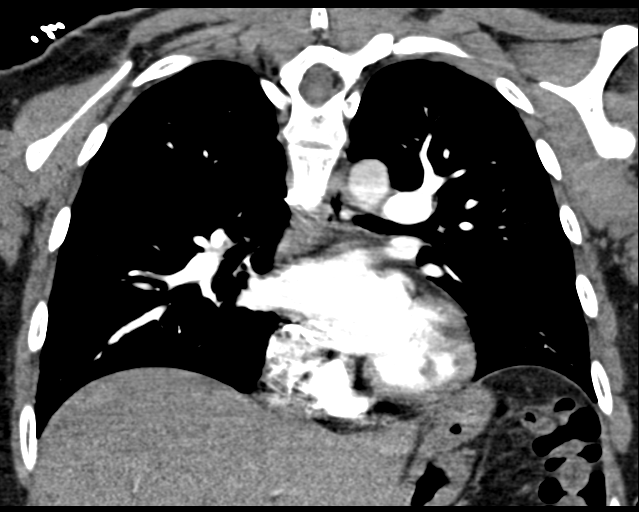

[19 of 36 positions shown; findings below may reference images not displayed]

FINDINGS: Cardiovascular: Satisfactory opacification of the pulmonary arteries
to the segmental level. No evidence of pulmonary embolism. Normal
heart size. No pericardial effusion. Normal caliber thoracic aorta.
No dissection.

Mediastinum/Nodes: No enlarged mediastinal, hilar, or axillary lymph
nodes. Thyroid gland, trachea, and esophagus demonstrate no
significant findings.

Lungs/Pleura: Lungs are clear. No pleural effusion or pneumothorax.

Upper Abdomen: No acute abnormality.

Musculoskeletal: No acute bony abnormalities. Benign-appearing
sclerosis in a lower thoracic vertebra.

Review of the MIP images confirms the above findings.
IMPRESSION: No evidence of significant pulmonary embolus. No evidence of active
pulmonary disease.

## 2018-02-22 ENCOUNTER — Other Ambulatory Visit: Payer: Self-pay | Admitting: Family Medicine

## 2018-02-22 DIAGNOSIS — Z3041 Encounter for surveillance of contraceptive pills: Principal | ICD-10-CM

## 2018-02-22 NOTE — Telephone Encounter (Signed)
Last refilled 12-14-17 . Last office visit 02-21-17

## 2018-02-23 MED ORDER — NORGESTIMATE-ETHINYL ESTRADIOL 0.18MG/0.215MG/0.25MG-0.035MG(28)TABLET
1.0000 | ORAL_TABLET | Freq: Every day | ORAL | 1 refills | Status: DC
Start: 2018-02-23 — End: 2018-03-16

## 2018-03-16 ENCOUNTER — Encounter: Payer: Self-pay | Admitting: Family Medicine

## 2018-03-16 ENCOUNTER — Other Ambulatory Visit: Payer: Self-pay | Admitting: Family Medicine

## 2018-03-16 DIAGNOSIS — Z3041 Encounter for surveillance of contraceptive pills: Principal | ICD-10-CM

## 2018-03-16 NOTE — Telephone Encounter (Signed)
Last refilled 02-23-18 . Last office visit 02-21-17

## 2018-04-11 ENCOUNTER — Other Ambulatory Visit: Payer: Self-pay | Admitting: Family Medicine

## 2018-04-11 DIAGNOSIS — Z3041 Encounter for surveillance of contraceptive pills: Principal | ICD-10-CM

## 2018-04-11 NOTE — Telephone Encounter (Signed)
Spoke with patient she states she is now living in [place]. She states that she is wondering if Dr Jeanella Craze can refill one more time until she can get an appointment.     She is also asking about the results of EGD

## 2018-04-11 NOTE — Telephone Encounter (Signed)
Last refilled 03-16-18 . Last office visit 02-21-17

## 2018-04-13 NOTE — Telephone Encounter (Signed)
Left message on voice mail to call back and make a video appointment with Dr Jeanella Craze.

## 2018-04-20 ENCOUNTER — Ambulatory Visit: Payer: BLUE CROSS/BLUE SHIELD

## 2018-04-20 DIAGNOSIS — Z0000 Encounter for general adult medical examination without abnormal findings: Secondary | ICD-10-CM

## 2018-04-20 DIAGNOSIS — Z30019 Encounter for initial prescription of contraceptives, unspecified: Secondary | ICD-10-CM

## 2018-04-20 DIAGNOSIS — R0989 Other specified symptoms and signs involving the circulatory and respiratory systems: Secondary | ICD-10-CM

## 2018-04-20 DIAGNOSIS — R000 Tachycardia, unspecified: Secondary | ICD-10-CM

## 2018-04-20 DIAGNOSIS — R6889 Other general symptoms and signs: Secondary | ICD-10-CM

## 2018-04-20 DIAGNOSIS — R09A2 Globus sensation: Secondary | ICD-10-CM

## 2018-04-20 NOTE — H&P
[facility]  [address], [address]  [place] [address]  Phone: [phone]  Fax: [phone]    Internal Medicine New Patient Visit  Provider/Author: Leta Baptist, MD   Patient's Name: [name] [name]  MRN: [id number]  DOB: [birth year]  Visit Date:04/20/2018    Chief Complaint:   Chief Complaint   Patient presents with   ??? New Consult     rash,hot flashes        HPI:  Cris Gibby is a 32 y.o. year old female with a past medical history as below who presents for rash and hot flashes.    Complains of sensation of something getting stuck in the chest started April 2018. Had EGD in [place], [place] but did not get results. Used omeprazole previously with improvement. Prefers lifestyle changes which have been effective. Early satiety and bloating with eating small amounts. Reports neg. H pyori testing in the past. Certain foods make symptoms worse (chocolate). Has modified portion sizes which improves symptoms. Eats dinner 5-6 hours before symptoms. Also sleeps with head elevated, which improves symptoms. Denies fever/chills, unintentional weight loss.    Complains of several years of feeling hot all the time inappropriately. Others have commented on this. Excess sweating.    Complains of strong reaction to bug bites. Develops large itchy patches which are erythematous. Some improvement with topical hydrocortisone cream.    Has been on birth control since age 77. Started because she was getitng period twice per month and bleeding lasted 8 days.    For exercise, takes walks daily. Exercise classes. Plans to go to the gym.    Past Medical History:  Past Medical History:   Diagnosis Date   ??? GERD (gastroesophageal reflux disease)    ??? 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 WISDOM TOOTH EXTRACTION         Problems:  Patient Active Problem List   Diagnosis   ??? Raynaud phenomenon Social History:  Social History     Social History Main Topics   ??? Smoking status: Never Smoker   ??? Smokeless tobacco: Never Used   ??? Alcohol use Yes      Comment: social    ??? Drug use: No   ??? Sexual activity: Yes     Partners: Male     Birth control/ protection: Pill     Occupational History   ??? psychology fellow [employer]     Social History   ??? Marital status: Single     Spouse name: N/A   ??? Number of children: N/A   ??? Years of education: N/A     Social History Narrative   ??? No narrative on file       Family History:  Family History   Problem Relation Age of Onset   ??? Lupus Mother    ??? Scleroderma Father    ??? Breast cancer Maternal Grandmother 31   ??? Gout Maternal Grandfather    ??? Stroke Paternal Grandmother    ??? Skin cancer Paternal Grandfather        Medications/Allergies:  Medications that the patient states to be currently taking   Medication Sig   ??? norgestimate-ethinyl estradiol (ORTHO TRI-CYCLEN, TRI SPRINTEC) 0.18/0.215/0.25 mg-35 mcg tablet Take 1 tablet by mouth daily.   ??? [DISCONTINUED] norgestimate-ethinyl estradiol (ORTHO TRI-CYCLEN, TRI SPRINTEC) 0.18/0.215/0.25 mg-35 mcg tablet  Take 1 tablet by mouth.     No Known Allergies      Review of Systems:  A 14 system review was performed; all systems were negative except as documented above.      Physical Exam:  Last Recorded Vital Signs:    04/20/18 1629   BP: 93/63   Pulse: (!) 100   Resp: 18   Temp: 36.8 ???C (98.3 ???F)   SpO2: 97%     Vital signs reviewed  Constitutional: well developed, well appearing female, in no acute distress  Eyes: normal conjunctiva, sclera anicteric, PERRLA, EOMI  ENT: normocephalic, atraumatic, auditory canal and TM normal in appearance, nasal mucosa moist and without drainage, moist oral mucosa, no oral lesions  Neck: supple, trachea midline, no lymphadenopathy, no thyromegaly, nodules or goiter  Respiratory: breathing normally, no distress, lungs clear to auscultation bilaterally, no wheezes, rales or rhonchi Cardiovascular: regular tachycardia, S1/S2 present, no murmur, rubs or gallops, no JVD  Chest wall: without deformity  Abdomen: normoactive bowel sounds, soft, nontender, nondistended  Genitourinary: deferred  Lymphatic: no cervical or supraclavicular lymphadenopathy  MSK: gait is normal, normal range of motion of the extremities, no clubbing, cyanosis or edema  Skin: color normal for stated race, several 1cm erythematous lesions that are non-tender on the ankles bilaterally  Neurologic: CN II-XII intact. Strength full throughout. Sensation intact to light touch.      Labs/imaging:  I have   [] reviewed radiology,  [] reviewed labs, [] reviewed diag med test, [] reviewed & summarized old records,  [] reviewed outside medical records provided by the patient,  [] requested outside medical records.    No results found for: WBC, HGB, HCT, MCV, PLT  No results found for: CREAT, BUN, NA, K, CL, CO2  No results found for: ALT, AST, GGT, ALKPHOS, BILITOT  No results found for: CHOL, CHOLDLCAL, CHOLHDL, TRIGLY, NOHDLCHOCAL  No results found for: HGBA1C  No results found for: TSHRFT4FT3, TSH, T3TOTAL, T3AUTO, T4AUTO, T4TOTAL    Assessment and Plan:  32 yo F with globus sensation and possible GERD, heat intolerance and tachycardia.    1. Well woman exam without gynecological exam  - Request outside records.  - Flu UTD.  - Pap likely up to date. Pending records.  - I counseled the patient on routine dental care.  - I counseled the patient on diet and exercise.  - I counseled the patient on regular ophthalmologic care.  - I counseled the patient on sunscreen usage.  - CBC & Auto Differential; Future  - Hepatic Funct Panel; Future  - Basic Metabolic Panel; Future  - Lipid Panel; Future  - Hgb A1c; Future  - HIV-1/2 Ag/Ab 4th Generation with Reflex Confirmation; Future  - The patient verbally consented to HIV testing.  - Referral to Dermatology - screening exam  - Referral to Ophthalmology - screening exam 2. Globus sensation possible GERD  - Request outside records of EGD  - Referral to Gastroenterology    3. Heat intolerance  - CBC & Auto Differential; Future  - Hepatic Funct Panel; Future  - Basic Metabolic Panel; Future  - TSH with reflex FT4, FT3; Future    4. Encounter for female birth control  - norgestimate-ethinyl estradiol (ORTHO TRI-CYCLEN, TRI SPRINTEC) 0.18/0.215/0.25 mg-35 mcg tablet; Take 1 tablet by mouth daily.  Dispense: 3 package; Refill: 3    5. Tachycardia  - CBC & Auto Differential; Future  - Hepatic Funct Panel; Future  - Basic Metabolic Panel; Future  - TSH with reflex FT4, FT3;  Future  - ECG 12 lead; Future      Health Maintenance:  Health Maintenance   Topic Date Due   ??? HIV Screening  09/01/2004   ??? Tdap/Td Vaccine (1 - Tdap) 09/01/2005   ??? Cervical Ca Screening: HPV Testing  09/01/2016   ??? Cervical Ca Screening: PAP Smear  09/01/2016   ??? Influenza Vaccine  Completed       Follow up:  Return in about 3 months (around 07/21/2018) for regular follow-up.    The above plan of care including diagnosis, orders, and follow-up was discussed with the patient, who expressed understanding and agreement. Any questions regarding the plan of care were answered during the visit.    Time Spent: I spent 30 minutes caring for this patient, and greater than 50% of this encounter was spent in counseling and coordination of care of above problems.     Author:  Waverly Ferrari. Dub Mikes, MD 04/20/2018 5:33 PM  Clinical Instructor  [employer] [facility] [place]  Department of [facility] - [facility]

## 2018-04-20 NOTE — Patient Instructions
-   Please make an appointment to see Dr Juel Burrow (Gastroenterology- GI).  - Please make an appointment to see Dr Earlene Plater (Dermatology).    -Save your skin! Always wear sunscreen if you are going to be outside. Remember that when you are driving, your left arm and the left side of your face are exposed to the sun.  -Don't use Q-tips! Instead, clean your ear non-traumatically using Debrox (or the pharmacy equivalent). Put 5-10 drops in your ear and let it sit for 1-2 minutes. Then let the liquid and wax drain out. You can do this 2-3 times per week to prevent buildup of ear wax.  -Always wear your seatbelt!    -Don't forget to sign up for the [facility] patient portal so that you can see your lab results and send messages to our office.

## 2018-04-21 ENCOUNTER — Ambulatory Visit: Payer: BLUE CROSS/BLUE SHIELD

## 2018-04-21 MED ORDER — NORGESTIM-ETH ESTRAD TRIPHASIC 0.18/0.215/0.25 MG-35 MCG PO TABS
1 | PACK | Freq: Every day | ORAL | 3 refills | Status: AC
Start: 2018-04-21 — End: ?

## 2018-04-25 ENCOUNTER — Ambulatory Visit: Payer: BLUE CROSS/BLUE SHIELD

## 2018-04-27 NOTE — Telephone Encounter (Signed)
Left message on voicemail for patient to call back. 

## 2018-04-27 NOTE — Telephone Encounter (Signed)
Patient returned call and states she has established care with a new PCP. Please disregard this request.

## 2018-05-01 ENCOUNTER — Ambulatory Visit: Payer: BLUE CROSS/BLUE SHIELD

## 2018-05-01 ENCOUNTER — Telehealth: Payer: BLUE CROSS/BLUE SHIELD

## 2018-05-01 ENCOUNTER — Ambulatory Visit: Payer: BLUE CROSS/BLUE SHIELD | Attending: Gastroenterology

## 2018-05-01 DIAGNOSIS — D229 Melanocytic nevi, unspecified: Secondary | ICD-10-CM

## 2018-05-01 DIAGNOSIS — Z1283 Encounter for screening for malignant neoplasm of skin: Secondary | ICD-10-CM

## 2018-05-01 DIAGNOSIS — R0789 Other chest pain: Secondary | ICD-10-CM

## 2018-05-01 DIAGNOSIS — K219 Gastro-esophageal reflux disease without esophagitis: Secondary | ICD-10-CM

## 2018-05-01 DIAGNOSIS — R1310 Dysphagia, unspecified: Secondary | ICD-10-CM

## 2018-05-01 NOTE — Progress Notes
Dermatology Patient Note:    Patient: [name] [name]  MRN: [id number]  Date of Birth: Aug 15, 1985  Date of Service: 05/01/2018    Referring Provider: Resa Miner., MD  Primary Care Provider: Resa Miner., MD  Chief Complaint:   Chief Complaint   Patient presents with   ??? New Consult     skin screening         History of present illness:  [name] [name], a 32 y.o. year old female referred by Resa Miner., MD for evaluation of her moles.  ???Last dermatology clinic visit: none, she is a new patient    ?New complaint: new moles   Location: arms  Duration: months  Context:did have sunburns when she was a teenager and not wearing sunscreen  Symptoms: none, she denies any moles are symptomatic or changing  Severity: mild  Treatment: none  Alleviating/exacerbating factors: none    Other skin concerns: none  Patient denies any other new or changing lesions of concern.    Patient has no personal history of melanoma or non-melanoma skin cancer. No family history of skin cancer.  Patient does wear sunscreen daily on the face and neck.     Past Medical History:   Diagnosis Date   ??? GERD (gastroesophageal reflux disease)    ??? 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 WISDOM TOOTH EXTRACTION       Family History   Problem Relation Age of Onset   ??? Lupus Mother    ??? Scleroderma Father    ??? Breast cancer Maternal Grandmother 64   ??? Gout Maternal Grandfather    ??? Stroke Paternal Grandmother    ??? Skin cancer Paternal Grandfather      Social History     Socioeconomic History   ??? Marital status: Single     Spouse name: Not on file   ??? Number of children: Not on file   ??? Years of education: Not on file   ??? Highest education level: Not on file   Occupational History   ??? Occupation: psychology fellow     Employer: [employer]   Social Needs   ??? Financial resource strain: Not on file   ??? Food insecurity:     Worry: Not on file Inability: Not on file   ??? Transportation needs:     Medical: Not on file     Non-medical: Not on file   Tobacco Use   ??? Smoking status: Never Smoker   ??? Smokeless tobacco: Never Used   Substance and Sexual Activity   ??? Alcohol use: Yes     Comment: social    ??? Drug use: No   ??? Sexual activity: Yes     Partners: Male     Birth control/protection: Pill   Lifestyle   ??? Physical activity:     Days per week: Not on file     Minutes per session: Not on file   ??? Stress: Not on file   Relationships   ??? Social connections:     Talks on phone: Not on file     Gets together: Not on file     Attends religious service: Not on file     Active member of club or organization: Not on file     Attends meetings of clubs or organizations: Not on file     Relationship status: Not on file  Other Topics Concern   ??? Not on file   Social History Narrative   ??? Not on file     Patient Active Problem List   Diagnosis   ??? Raynaud phenomenon   ??? Globus sensation   ??? Heat intolerance     Medications that the patient states to be currently taking   Medication Sig   ??? norgestimate-ethinyl estradiol (ORTHO TRI-CYCLEN, TRI SPRINTEC) 0.18/0.215/0.25 mg-35 mcg tablet Take 1 tablet by mouth daily.     No Known Allergies    Review of Systems:   Constitutional: The patient feels well. No fevers or weight loss.  Skin: otherwise negative  ???  Physical Exam: Skin Type IV  Gen: pleasant, NAD, appears as stated age  Eyes (lids/conjunctiva): examined  Head: examined  Scalp: examined  Lips: examined  Neck: examined  Chest/breasts/axillae: examined  Back: examined  Abdomen: examined  Buttocks: examined  L upper extremity: examined  R upper extremity: examined  L lower extremity: examined  R lower extremity: examined  Digits/nails: examined    All areas examined were within normal limits with the following exceptions:    - Multiple pink to brown macules and papules scattered on face, trunk, extremities. Symmetric, uniform in color, regular borders - 2 mm nevus right posterior heel evenly pigmented   - 3 mm nevus left ear helix evenly pigmented    Visit Diagnoses:  1. Multiple benign nevi    2. Screening for malignant neoplasm of skin        Assessment & Plan:    1. Multiple benign nevi    Recommended biopsies and excisions: none  Discussed ABCDEs of moles and melanoma  Recommended daily physical sunblock SPF 30+ and monthly self skin exams  Annual dermatologic exams  Ophtho and in females Gyn exams recommended  Vitamin D discussed  Education provided      2. Screening for malignant neoplasm of skin        Sun protection strategies and skin cancer risk factors reviewed with patient.     Follow-up:  annually , or as needed sooner for any new, changing, evolving, or concerning lesions or symptoms.    The above plan of care, diagnosis, orders, and follow-up were discussed with the patient.  Questions related to this recommended plan of care were answered.  ?    Dorise Hiss, MD  05/01/2018  9:54 AM  Division of Dermatology  [facility]  ???

## 2018-05-01 NOTE — Patient Instructions
What to look for when monitoring your moles:        A-B-C-D-E guide     Characteristics of unusual moles that may indicate melanomas or other skin cancers follow the A-B-C-D-E guide developed by the American Academy of Dermatology:  ? A is for asymmetrical shape. Look for moles with irregular shapes, such as two very different-looking halves.  ? B is for irregular border. Look for moles with irregular, notched or scalloped borders, these may be characteristics of melanomas.  ? C is for changes in color. Look for growths that have many colors or an uneven distribution of color, shades of tan, brown, black, sometimes white, red or blue  ? D is for diameter. Look for new growth in a mole larger than about 1/4 inch (6 millimeters).  ? E is for evolving. Look for changes over time, such as a mole that grows in size or that changes color or shape. Moles may also evolve to develop new signs and symptoms, such as new itchiness or bleeding.    Other suspicious changes in a mole may include: Scaly skin, itching, spreading of pigment from the mole into the surrounding skin, oozing or bleeding. Cancerous (malignant) moles vary greatly in appearance. Some may show all of the changes listed above, while others may have only one or two unusual characteristics.     Please follow up in 1 year for a skin exam or sooner for any new, changing, evolving, or concerning moles.    Sunscreen SPF 30-50 with Zinc Oxide recommended

## 2018-05-01 NOTE — Patient Instructions
1. Upper endoscopy with Bravo pH monitoring and manometry to workup your symptoms   2. Continue off antacid medication for now but have available for your pH monitoring study

## 2018-05-01 NOTE — Telephone Encounter
I spoke with Mr./Ms. Renken regarding the results of her most recent labs. she was given the opportunity to ask questions, which were answered during the call.

## 2018-05-01 NOTE — Consults
PATIENTDiyala [name]  MRN: [id number]  DOB: 1985-11-07  DATE OF SERVICE: 05/01/2018    REFERRING PRACTITIONER: Resa Miner., MD  PRIMARY CARE PROVIDER: Resa Miner., MD    REASON FOR REFERRAL: Globus sensation      Subjective:     Chief Complaint:  [name] [name] is a 32 y.o. female who presents for globus sensation and possible GERD.     Since April 2018, patient states that she feels ''something getting stuck'' in her chest (around her breast bone). She has this discomfort even when fasting however depending on the type and volume of food, she gets some tightness in the chest. If she eats prior to sleeping, she has significant pain in the chest and stomach requiring her to sit up. She also has significant burping which preceeding the symptoms of ''fullness'' by one year. She was previously seen for GERD symptoms by Dr Rhea Belton [facility]) in 2018. She had a CT scan which was performed which was negative. She was started on omeprazole daily and carafate with improvement in her symptoms. She then relocated to [facility] - she had Hpylori testing which was negative and EGD which was normal. She denies any esophageal spasms. She feels that when she swallows saliva - she does not feel that she clears the saliva. She does not have food restrictions but chocolate worsens symptoms. She is inclined when sleeping with reflux pillow and avoids eating prior to sleep. No nausea or vomiting. No weight loss. She does have bloating. She has chronic constipation but uses herbal tea and laxatives to help with her bowel habits. No other symptoms.     Past Medical History:    Past Medical History:   Diagnosis Date   ??? GERD (gastroesophageal reflux disease)    ??? 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 WISDOM TOOTH EXTRACTION         Family History: family history includes Breast cancer (age of onset: 27) in her maternal grandmother; Gout in her maternal grandfather; Lupus in her mother; Scleroderma in her father; Skin cancer in her paternal grandfather; Stroke in her paternal grandmother.    Social History:  reports that she has never smoked. She has never used smokeless tobacco. She reports current alcohol use. She reports that she does not use drugs.;   Social History     Patient does not qualify to have social determinant information on file (likely too young).   Social History Narrative   ??? Not on file       Allergies:   has No Known Allergies.    Medications:  Current Outpatient Medications   Medication Sig   ??? norgestimate-ethinyl estradiol (ORTHO TRI-CYCLEN, TRI SPRINTEC) 0.18/0.215/0.25 mg-35 mcg tablet Take 1 tablet by mouth daily.   ??? omeprazole 20 mg DR capsule Take 20 mg by mouth.     No current facility-administered medications for this visit.        Review of Systems:  A complete ROS was performed. Pertinent positives and negatives are in the HPI.  Remainder of 14 systems is negative.      Objective:     Physical Exam:    Vitals: BP 102/69  ~ Pulse 93  ~ Temp 36.2 ???C (97.1 ???F) (Oral)  ~ Resp 18  ~ Ht 5' 3'' (1.6 m)  ~ Wt 136 lb 9.6  oz (62 kg)  ~ LMP 04/29/2018  ~ SpO2 97%  ~ BMI 24.20 kg/m???    Constitutional:WD/WN alert, appears stated age and cooperative   Skin: Skin color, texture, turgor normal. No rashes or lesions   Head: Normocephalic, without obvious abnormality, atraumatic   Eyes: conjunctivae/corneas clear. PERRL, EOM's intact, anicteric.    Mouth: No perioral lesions.  Tongue is normal.  Mucous membranes moist.  No oral or posterior pharyngeal lesions   Neck: no adenopathy, supple, symmetrical, trachea midline and thyroid not enlarged, symmetric, no tenderness/mass/nodules   Back: symmetric, no curvature. ROM normal. No CVA tenderness.   Lungs: clear to auscultation bilaterally   Heart: regular rate and rhythm, S1, S2 normal, no murmur, click, rub or gallop Abdomen: Abdomen is soft and nontender with active bowel sounds. No rebound or guarding. No appreciable flank or shifting dullness. No appreciable hepatosplenomegaly. No umbilical hernia.   Extremities: extremities normal, atraumatic, no cyanosis or edema   Skin: Skin color, texture, turgor normal. No rashes or lesions   Pulses: 2+ and symmetric   Lymph Nodes: Cervical, supraclavicular, and axillary nodes normal.   Neurologic: awake, alert and oriented.   Psychiatric:       Lab Review:  Lab Results   Component Value Date    WBC 8.54 04/28/2018    HGB 14.1 04/28/2018    HCT 43.5 04/28/2018    MCV 88.4 04/28/2018    PLT 291 04/28/2018     Lab Results   Component Value Date    CREAT 0.68 04/28/2018    BUN 10 04/28/2018    NA 140 04/28/2018    K 4.2 04/28/2018    CL 101 04/28/2018    CO2 26 04/28/2018    ALT 15 04/28/2018    AST 20 04/28/2018    ALKPHOS 54 04/28/2018    BILITOT 0.3 04/28/2018    ALBUMIN 4.2 04/28/2018    CALCIUM 9.7 04/28/2018    TOTPRO 8.0 04/28/2018       Imaging:  None    GI Studies:  March 2019 EGD: normal per patient    Assessment :     [name] [name] is a 32 y.o. female who presents for globus sensation and chest tightness/fullness.     # Dysphagia vs atypical chest pressure. Patient is having intermittent symptoms of chest fullness in the distal esophagus and sensation of difficulty clearing food. She may have some component of reflux given improvement of symptoms on PPI therapy and behavioral interventions. She is currently off PPI therapy with continued symptoms. She may also have some component of esophageal spasms causing her atypical chest pain. No nausea or vomiting.     # Bloating, could be related to Hyplori vs celiac. Will perform EGD with biopsies to further evaluate.     Plan:     - EGD with Bravo (96-h pH monitoring on and off PPI) and manometry   - Attempt to get records from previous EGD in March 2019   - Continue off PPI therapy for now but have available for Bravo Author: Junius Roads, MD 05/01/2018 9:32 AM    I saw and examined the patient on 05/01/18, reviewed the patient's diagnostic testing, and discussed the case with the fellow. I agree with the findings and plan as documented in the note above, which reflects our jointly developed plan.   ???  Fransisco Beau, MD, MPH  [employer]  Department of Medicine, Gastroenterology  [facility] Division of  Digestive Diseases

## 2018-05-31 ENCOUNTER — Ambulatory Visit: Payer: BLUE CROSS/BLUE SHIELD

## 2018-05-31 DIAGNOSIS — J069 Acute upper respiratory infection, unspecified: Secondary | ICD-10-CM

## 2018-06-01 MED ORDER — BENZONATATE 100 MG PO CAPS
100 mg | ORAL_CAPSULE | Freq: Three times a day (TID) | ORAL | 0 refills | Status: AC | PRN
Start: 2018-06-01 — End: 2018-06-01

## 2018-06-01 MED ORDER — LIDOCAINE VISCOUS HCL 2 % MT SOLN
5 mL | ORAL | 0 refills | Status: AC | PRN
Start: 2018-06-01 — End: 2018-06-01

## 2018-06-01 MED ORDER — LIDOCAINE VISCOUS HCL 2 % MT SOLN
5 mL | ORAL | 0 refills | Status: AC | PRN
Start: 2018-06-01 — End: ?

## 2018-06-01 MED ORDER — BENZONATATE 100 MG PO CAPS
100 mg | ORAL_CAPSULE | Freq: Three times a day (TID) | ORAL | 0 refills | Status: AC | PRN
Start: 2018-06-01 — End: ?

## 2018-06-01 NOTE — Progress Notes
[facility]  [address], [address]  [place] [address]  Phone: [phone]  Fax: [phone]    Internal Medicine Office Visit  Provider/Author: Leta Baptist, MD    Patient's Name: [name] [name]  MRN: [id number]  DOB: 1985/12/13  Visit Date:05/31/2018    Subjective:   Chief Complaint: Nasal Congestion and Cough    HPI:  [name] [name] is a 32 y.o. year old female who presents to clinic for Nasal Congestion and Cough.    Patient complains of 4 days of non-productive cough, rhinorrhea, sore throat and sinus congestion. Symptoms start all at once. Non-radiating. Occasionally able to produce some mucus in the mornings with cough, but otherwise non-productive. No fever/chills, but does report some cold intolerance. No clear sick contacts, but symptoms started after being at [facility] in the cold and with wet shoes/socks. No nausea/vomiting. Occasional loose stool.    Past Medical History:  Past Medical History:   Diagnosis Date   ??? GERD (gastroesophageal reflux disease)    ??? 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 Raynaud phenomenon   ??? Globus sensation   ??? Heat intolerance       Medications/Allergies:  Medications that the patient states to be currently taking   Medication Sig   ??? norgestimate-ethinyl estradiol (ORTHO TRI-CYCLEN, TRI SPRINTEC) 0.18/0.215/0.25 mg-35 mcg tablet Take 1 tablet by mouth daily.     No Known Allergies    Review of Systems:  A 14 system review was performed; all systems were negative except as documented above.    Objective:   Physical Exam:  Last Recorded Vital Signs:    05/31/18 1642   BP: 115/69   Pulse: 98   Resp: 17   Temp: 36.8 ???C (98.3 ???F)   SpO2: 98%       Vital signs reviewed  Constitutional: well developed, well appearing female, in no acute distress  Eyes: normal conjunctiva, sclera anicteric, PERRLA, EOMI ENT: normocephalic, atraumatic, auditory canal and TM normal in appearance, nasal mucosa moist and without drainage, moist oral mucosa with pharyngeal erythema and without exudates  Neck: supple, trachea midline, no thyromegaly, nodules or goiter  Respiratory: coughing, no distress, lungs clear to auscultation bilaterally, no wheezes, rales or rhonchi  Lymph: no cervical, submandibular, preauricular or supraclavicular lymph nodes appreciated    Labs/imaging:    I have   [] reviewed radiology,  [] reviewed labs, [] reviewed diag med test, [] reviewed & summarized old records, [] reviewed outside medical records provided by the patient, [] requested outside medical records.    Lab Results   Component Value Date    WBC 8.54 04/28/2018    HGB 14.1 04/28/2018    HCT 43.5 04/28/2018    MCV 88.4 04/28/2018    PLT 291 04/28/2018     Lab Results   Component Value Date    CREAT 0.68 04/28/2018    BUN 10 04/28/2018    NA 140 04/28/2018    K 4.2 04/28/2018    CL 101 04/28/2018    CO2 26 04/28/2018     Lab Results   Component Value Date    ALT 15 04/28/2018    AST 20 04/28/2018    ALKPHOS 54 04/28/2018    BILITOT 0.3 04/28/2018     Lab Results   Component Value Date    CHOL 239 04/28/2018    CHOLDLCAL 114 (H) 04/28/2018  CHOLHDL 85 04/28/2018    TRIGLY 198 (H) 04/28/2018    NOHDLCHOCAL 154 (H) 04/28/2018     Lab Results   Component Value Date    HGBA1C 5.4 04/28/2018     Lab Results   Component Value Date    TSH 0.94 04/28/2018       Assessment/Plan:   32 y.o. yo female with:    1. Upper respiratory tract infection, unspecified type  - likely viral URI  - POCT rapid strep A neg in office.  - benzonatate 100 mg capsule; Take 1 capsule (100 mg total) by mouth three (3) times daily as needed for Cough.  Dispense: 30 capsule; Refill: 0  - lidocaine viscous 2% oral solution; Swish and spit 5 mLs as needed for (sore throat).  Dispense: 100 mL; Refill: 0  - Zicam.  - DayQuil/NyQuil, +/- Mucinex. prn    Follow up: Return if symptoms worsen or fail to improve.    The above plan of care including diagnosis, orders, and follow-up was discussed with the patient, who expressed understanding and agreement. Any questions regarding the plan of care were answered during the visit.    Time Spent: I spent 15 minutes caring for this patient, and greater than 50% of this encounter was spent in counseling and coordination of care of above problems.     Author:  Waverly Ferrari. Dub Mikes, MD 05/31/2018 5:03 PM  Clinical Instructor  [employer]  Department of [facility]

## 2018-06-01 NOTE — Patient Instructions
-  You have a viral upper respiratory infection. I recommend Dayquil Cold and Flu (acetaminophen/dextromethorphan/phenylepherine) as needed every 4 to 6 hours while awake and Nyquil Cold and Flu (acetaminophen/dextromethorphan/doxylamine) at bedtime.

## 2018-07-25 ENCOUNTER — Telehealth: Payer: BLUE CROSS/BLUE SHIELD

## 2018-07-25 NOTE — Telephone Encounter
Contacted patient to schedule EGD w/bravo.Patient stated she will call back when she is available. If patient calls back, please assist with scheduling. Thank you    For all other departments, please transfer to MPU.  [phone]

## 2019-01-02 ENCOUNTER — Ambulatory Visit: Payer: BLUE CROSS/BLUE SHIELD

## 2019-01-10 ENCOUNTER — Telehealth: Payer: BLUE CROSS/BLUE SHIELD

## 2019-01-10 NOTE — Telephone Encounter
Dr. Tenna Delaine,  Pt is returning your call. Can you please call her back.

## 2019-01-10 NOTE — Telephone Encounter
PDL Call to Practice    Reason for Call:Returning Dr Patric Dykes call  MD: [facility]    Appointment Related?  []  Yes  [x]  No     If yes;  Date:  Time:    Call warm transferred to PDL: [x]  Yes  [x]  No    Call Received by Practice Representative: Ascencion Dike

## 2019-01-10 NOTE — Telephone Encounter
Record request has been faxed to to fax # [phone]

## 2019-01-10 NOTE — Telephone Encounter
Records received and uploaded into chart

## 2019-01-10 NOTE — Telephone Encounter
Left VM to call back 

## 2019-01-12 ENCOUNTER — Telehealth: Payer: BLUE CROSS/BLUE SHIELD

## 2019-01-13 NOTE — Telephone Encounter
Call Back Request    MD:  Dr. Tenna Delaine      Reason for call back:     Pt called & stated she just missed a call from Dr. Tenna Delaine & is requesting a call back     Mobile: [phone] (Preferred)    Any Symptoms:  []  Yes  [x]  No      ? If yes, what symptoms are you experiencing:    o Duration of symptoms (how long):    o Have you taken medication for symptoms (OTC or Rx):      Patient or caller has been notified of the 24-48 hour turnaround time.

## 2019-01-15 NOTE — Telephone Encounter
Forwarded by: Sharion Dove  She said she returned your call.

## 2019-01-15 NOTE — Telephone Encounter
Left VM

## 2019-01-24 ENCOUNTER — Telehealth: Payer: BLUE CROSS/BLUE SHIELD

## 2019-01-24 DIAGNOSIS — Z1239 Encounter for other screening for malignant neoplasm of breast: Secondary | ICD-10-CM

## 2019-01-25 NOTE — Progress Notes
Brief telephone call:    We discussed her request for mammogram including her family history in her maternal grandmother and great grandmother. She has considerable anxiety regarding this and would like to proceed with mammography. We discussed the possibility of dense breast tissue leading to false positive results and further testing. She would like to proceed.    She requested asymptomatic COVID testing. We discussed that it is not available at [facility] at this time. Recommended checking [facility] as alternatives as she would like to be tested this week for family visiting next week.    The patient agrees with this plan of care. The patient was given the opportunity to ask questions regarding the plan of care, which were answered during the call.

## 2019-03-09 ENCOUNTER — Ambulatory Visit: Payer: BLUE CROSS/BLUE SHIELD

## 2019-03-13 MED ORDER — NORGESTIM-ETH ESTRAD TRIPHASIC 0.18/0.215/0.25 MG-35 MCG PO TABS
ORAL_TABLET | ORAL | 3 refills | 84.00000 days | Status: AC
Start: 2019-03-13 — End: ?

## 2019-04-17 ENCOUNTER — Ambulatory Visit: Payer: BLUE CROSS/BLUE SHIELD

## 2019-04-17 ENCOUNTER — Telehealth: Payer: BLUE CROSS/BLUE SHIELD

## 2019-04-17 ENCOUNTER — Inpatient Hospital Stay: Payer: BLUE CROSS/BLUE SHIELD | Attending: Gastroenterology

## 2019-04-17 DIAGNOSIS — R1310 Dysphagia, unspecified: Secondary | ICD-10-CM

## 2019-04-17 DIAGNOSIS — Z01812 Encounter for preprocedural laboratory examination: Secondary | ICD-10-CM

## 2019-04-17 DIAGNOSIS — H5789 Other specified disorders of eye and adnexa: Secondary | ICD-10-CM

## 2019-04-17 NOTE — Telephone Encounter
Forwarded by: Calloway Andrus    Please advise

## 2019-04-17 NOTE — Telephone Encounter
Message to Practice/Provider    MD: Ervin Knack    Message:  Pt called to schedule her EGD order, is the manometry to be placed with Cath? Please clarify, the pt's order will expire next month so please place a new one if needed.     Return call is not being requested by the patient or caller.    Patient or caller has been notified of the 24-48 hour processing turnaround time if applicable.

## 2019-04-17 NOTE — Telephone Encounter
Orders Request    Pt would like to be contacted on portal once new order is in CC.     What is being requested? (Tests, Labs, Imaging, etc.): Endoscopy order     Reason for the request: Per pt, MPU advised pt that the order will expire on 11/04. Procedure [place] set on 11/20 and new order needs to be placed.     Where does the patient want to be seen?  [facility]      If outside [facility], what is the fax number to the facility?      Has the patient seen their doctor for this matter? Yes     Last office visit: 05/01/18     Patient was offered an appointment but declined.    Patient has been notified of the 24-48 hour turnaround time.

## 2019-04-17 NOTE — Telephone Encounter
MPU Procedure Checklist:     _0  COVID Order has been pended and sent to MD performing procedure to sign.     Prep instructions have been delivered to patient via:  _1  Email to __________  _2  Fax to ____________  _3  Mail to Address on file  _4  Sent via letter on Mychart  _5  Preps provided by the office      _6  Encounter sent to FCU team if procedure is within the week or if any changes were made to originally scheduled procedure such as an insurance change or a procedure was added.       MAC Script    Patient has been advised of method of anesthesia that will be used in this procedure.  If MAC used, MAC script has been advised.  If IVCS used, pt given option of MAC.     _7  Pt has been offered all options and is requesting to be scheduled with MAC. Patient will pay $200 if authorization is denied.    _8  Pt has been scheduled in [place] on sedation day (if insurance applicable).      _9  Patient has requested to be scheduled with conscious sedation in one of our community Medical procedure units.

## 2019-04-17 NOTE — Patient Instructions
See Ophthalmology now, foreign body sensation since this morning with blurry vision    [facility]   [facility]  [address] [place],  [place], [place] [address]    Tel: [phone]  Fax: [phone]    Working Hours  Mon to Fri   8:52m - 6:00pm (Appt until 4:30pm)  Sat  9:00am - 2:00pm  Sun  Closed

## 2019-04-17 NOTE — Progress Notes
[facility] INTERNAL MEDICINE AND PED[facility]ATRICS  [facility]  [address]  [place] [address]  Phone: [phone]  Fax: [phone]    URGENT CARE VISIT          Name: [name] [name]  Medical Record Number: [id number]  Date of Service: 04/17/2019      Chief Complaint/Reason For Visit:       Chief Complaint   Patient presents with   ? Blurred Vision     Complaints of irritation to right eye, noticed this morning, eye feels irritated as if something is inside per patient     Patient presented to this facility independently.    History of Present Illness:      [name] [name] is a 33 y.o. female with GERD and listed PMH here c/o:  Eye irritation, Right.  Onset this morning.  Assoc w/ tearing, feels like has eyelash in eye, flushed eye in shower but hasn't helped.  Vision change: blurry to see close objects.  Discharge: no  Photophobia: no  URI symptoms: no  Nausea vomiting:  no  Headache: no  No eye injury or known foreign body.  Glasses: no  Contact lens: no  No make-up yesterday.  No eye injury or known foreign body.      Review of Systems:      Pertinent positives as noted in HPI.   Negative for fever/chills/headache/dizziness/eye pain/vision change or loss/congestion/sore throat/coughs/nausea vomiting/abdominal pain/neck or back pain/weakness.     Past History:      Patient Active Problem List   Diagnosis   ? Raynaud phenomenon   ? Globus sensation   ? Heat intolerance       Past Medical History:   Diagnosis Date   ? GERD (gastroesophageal reflux disease)    ? 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 WISDOM TOOTH EXTRACTION         Social History:      Social History     Socioeconomic History   ? Marital status: Single     Spouse name: Not on file   ? Number of children: Not on file   ? Years of education: Not on file   ? Highest education level: Not on file   Occupational History ? Occupation: psychology Dietitian: [employer]   Social Needs   ? Financial resource strain: Not on file   ? Food insecurity:     Worry: Not on file     Inability: Not on file   ? Transportation needs:     Medical: Not on file     Non-medical: Not on file   Tobacco Use   ? Smoking status: Never Smoker   ? Smokeless tobacco: Never Used   Substance and Sexual Activity   ? Alcohol use: Yes     Comment: social    ? Drug use: No   ? Sexual activity: Yes     Partners: Male     Birth control/protection: Pill   Lifestyle   ? Physical activity:     Days per week: Not on file     Minutes per session: Not on file   ? Stress: Not on file   Relationships   ? Social connections:     Talks on phone: Not on file     Gets together: Not on file  Attends religious service: Not on file     Active member of club or organization: Not on file     Attends meetings of clubs or organizations: Not on file     Relationship status: Not on file   Other Topics Concern   ? Not on file   Social History Narrative   ? Not on file         Medications:            Current Outpatient Medications (Respiratory Meds):   ?  benzonatate 100 mg capsule, Take 1 capsule (100 mg total) by mouth three (3) times daily as needed for Cough. (Patient not taking: Reported on 04/17/2019.), Disp: 30 capsule, Rfl: 0      Current Outpatient Medications (GI Meds):   ?  omeprazole 20 mg DR capsule, Take 20 mg by mouth daily ., Disp: , Rfl:               Current Outpatient Medications (Endocrine Meds):   ?  norgestimate-ethinyl estradiol (ORTHO TRI-CYCLEN, TRI SPRINTEC) 0.18/0.215/0.25 mg-35 mcg tablet, TAKE 1 TABLET BY MOUTH EVERY DAY, Disp: 84 tablet, Rfl: 3      Current Outpatient Medications (Derm Meds):   ?  lidocaine viscous 2% oral solution, Swish and spit 5 mLs as needed for (sore throat). (Patient not taking: Reported on 04/17/2019.), Disp: 100 mL, Rfl: 0        Allergies as of 04/17/2019   ? (No Known Allergies)        Vitals:   Vitals:    04/17/19 1508 BP: 95/64   Pulse: 77   Resp: 18   Temp: 37 ?C (98.6 ?F)   TempSrc: Forehead   SpO2: 98%   Weight: 133 lb 3.2 oz (60.4 kg)   Height: 5' 3'' (1.6 m)       Physical Exam:      GEN: alert, no acute distress.  HEAD FACE: normal appearance.  EYES: normal sclerae and conjunctivae, EOMI. PERRLA. No discharge, no photophobia, normal b/l eyelids.  EARS: TMs clear bilaterally.  OROPHARYNX: normal tonsils and pharynx.  NECK: supple.  CARDIOVASCULAR: normal heart rate and rhythm, no murmur.  LUNGS: clear to auscultation bilaterally.  Normal respiratory effort.  NEURO: normal gait.      Vision Screen:       Visual Acuity Screening    Right eye Left eye Both eyes   Without correction: 20/20 20/20 20/13    With correction:          Assessment and Plan:          To follow with pcp or urgent care sooner if no improvement, patient agreed with plan.    2100pm Called pt to follow up, LVM if seen ophthalmology, and call back tomorrow if concerns.    Discussed  Discharge: patient was discharged in stable condition.  ER precautions education, to ER if worse symptoms discussed. If worsening eye redness/pain/vision change.  Instructions: The above diagnosis, plan of care, orders, labs, including medications if given were explained and given to patient with acknowledgement and demonstrated understanding, with all questions answered to satisfaction.  See AVS for additional information and counseling materials if provided to the patient.      Philis Fendt, MD   Urgent Care/Family Medicine  (electronically signed by Thereasa Parkin)

## 2019-04-18 ENCOUNTER — Ambulatory Visit: Payer: BLUE CROSS/BLUE SHIELD

## 2019-04-18 NOTE — Telephone Encounter
EGD and 96 hour bravo together as already scheduled.  Esophageal manometry to be done separately - new order placed. Please advise patient to call the Scheduling office at [phone].   Thank you.

## 2019-04-18 NOTE — Telephone Encounter
Reply by: Juliette Mangle    FYI:

## 2019-04-18 NOTE — Telephone Encounter
Reply by: Juliette Mangle    Spoke with pt and was notified. No further action needed.

## 2019-04-24 NOTE — Telephone Encounter
Forwarded by: Benjaman Artman    Please advise

## 2019-04-24 NOTE — Telephone Encounter
Forwarded by: Geoffery Lyons Claude Waldman      Please extend auth for endoscopy

## 2019-04-25 ENCOUNTER — Ambulatory Visit: Payer: BLUE CROSS/BLUE SHIELD

## 2019-04-25 DIAGNOSIS — R1310 Dysphagia, unspecified: Secondary | ICD-10-CM

## 2019-04-25 DIAGNOSIS — R0989 Other specified symptoms and signs involving the circulatory and respiratory systems: Secondary | ICD-10-CM

## 2019-04-25 DIAGNOSIS — R12 Heartburn: Secondary | ICD-10-CM

## 2019-04-25 NOTE — Telephone Encounter
Forwarded by: Wendi Snipes, please forward to correct dept. Thank you

## 2019-04-25 NOTE — Telephone Encounter
Reply by: Neta Mends  New order placed for endoscopy and bravo study.

## 2019-04-27 ENCOUNTER — Ambulatory Visit: Payer: BLUE CROSS/BLUE SHIELD

## 2019-04-30 ENCOUNTER — Ambulatory Visit: Payer: BLUE CROSS/BLUE SHIELD

## 2019-04-30 DIAGNOSIS — Z01812 Encounter for preprocedural laboratory examination: Secondary | ICD-10-CM

## 2019-04-30 DIAGNOSIS — Z1330 Encounter for screening examination for mental health and behavioral disorders, unspecified: Secondary | ICD-10-CM

## 2019-04-30 DIAGNOSIS — Z135 Encounter for screening for eye and ear disorders: Secondary | ICD-10-CM

## 2019-04-30 DIAGNOSIS — R29818 Other symptoms and signs involving the nervous system: Secondary | ICD-10-CM

## 2019-04-30 DIAGNOSIS — Z01419 Encounter for gynecological examination (general) (routine) without abnormal findings: Secondary | ICD-10-CM

## 2019-04-30 DIAGNOSIS — H538 Other visual disturbances: Secondary | ICD-10-CM

## 2019-04-30 DIAGNOSIS — Z23 Encounter for immunization: Secondary | ICD-10-CM

## 2019-04-30 DIAGNOSIS — Z1159 Encounter for screening for other viral diseases: Secondary | ICD-10-CM

## 2019-04-30 DIAGNOSIS — E7800 Pure hypercholesterolemia, unspecified: Secondary | ICD-10-CM

## 2019-04-30 NOTE — Progress Notes
[facility]  [address], [address]  [place] [address]  Phone: [phone]  Fax: [phone]    Internal Medicine Annual Wellness Visit  Provider/Author: Leta Baptist, MD  Patient's Name: [name] [name]  MRN: [id number]  DOB: January 15, 1986  Visit Date:04/30/2019    Chief Complaint: Annual Exam    HPI:  [name] [name] is a 33 y.o. 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 Red meat maximum once per week if that. No fast food. No soda. Cooks at home, mostly olive oil.  Exercise - Youtube workouts. 30-93min. Recently got Peloton, but hasn't started. Exercises at least 4 times per week.  Blood pressure - acceptable.  Weight - acceptable.    Sexual health - female partner, monogamous.  Vaccinations - flu utd. Unsure of Tdap.  Cancer Screening - need pap smear.    The following separately identifiable issues were evaluated during this visit:     Blurry vision in the R eye lasting a week. Seen in ETC in [place]. Subsequently sent to outside ophtho.  Has improved. Would like to see ophtho for routine screening.    Requesting sleep eval. Told by her boyfriend that she has some gasping noises while sleeping and thought she should have a sleep study. No AM headaches.    STOP-BANG:  [x]  Snoring: Do you snore loudly (louder than talking or heard through closed doors)?  [x]  Tired: Do you often feel tired, fatigued, or sleepy during day?  [x]  Observed: Has anyone observed you stop breathing during your sleep?   []  Pressure: Do you have or are you being treated for high blood pressure?  []  BMI: >35 kg/m2  [] Age: >50 [] Neck circumference: > 16'' []  Gender: Female  Total:  3/8    Behavioral Health Screening:    PHQ-9 Results Depression Screening (Patient Health Questionnaire PHQ) 04/20/2018 04/17/2019 04/25/2019 04/30/2019   PHQ-2: Feeling down, depressed, or hopeless No No - -   PHQ-2: Little interest or pleassure in doing things No No - -   Little interest or pleasure in doing things - - Not at all Not at all   Feeling down, depressed, or hopeless - - Not at all Not at all   Trouble falling or staying asleep, or sleeping too much - - Not at all Not at all   Feeling tired or having little energy - - Not at all Not at all   Poor appetite or overeating - - Not at all Not at all   Feeling bad about yourself - or that you are a failure or have let yourself or your family down - - Not at all Not at all   Trouble concentrating on things, such as reading the newspaper or watching television - - Not at all Not at all   Moving or speaking so slowly Or being so fidgety or restless - - Not at all Not at all   Thoughts that you would be better off dead, or of hurting yourself in some way - - Not at all Not at all   Total Score - - 0 0       GAD-7 Results  GAD-7 04/25/2019 04/30/2019   Feeling nervous, anxious, or on edge 0-Not at all 0-Not at all  Not being able to stop or control worrying 0-Not at all 0-Not at all   Worrying too much about different things 0-Not at all 0-Not at all   Trouble relaxing 0-Not at all 0-Not at all   Being so restless that is hard to sit still 0-Not at all 0-Not at all   Becoming easily annoyed or irritable 0-Not at all 0-Not at all   Feeling afraid, as if something awful might happen 0-Not at all 0-Not at all   Total Score 0 0       Audit-C results  AUDIT-C 04/25/2019 04/30/2019   How often do you have a drink containing alcohol? 2 2   How many standard drinks containing alcohol do you have on a typical day? 0 0   How often do you have six or more drinks on one occasion? 0 0   Total Score 2 2         Past Medical History:  Past Medical History:   Diagnosis Date   ? Chicken pox ? GERD (gastroesophageal reflux disease)    ? 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 Seasonal allergies        Past Surgical History:  Past Surgical History:   Procedure Laterality Date   ? WISDOM TOOTH EXTRACTION         Problems:  Patient Active Problem List   Diagnosis   ? Raynaud phenomenon   ? Globus sensation   ? Heat intolerance       Social History:  Social History     Socioeconomic History   ? Marital status: Single     Spouse name: Not on file   ? Number of children: Not on file   ? Years of education: Not on file   ? Highest education level: Not on file   Occupational History   ? Occupation: psychology Dietitian: [employer]   Social Needs   ? Financial resource strain: Not on file   ? Food insecurity:     Worry: Not on file     Inability: Not on file   ? Transportation needs:     Medical: Not on file     Non-medical: Not on file   Tobacco Use   ? Smoking status: Never Smoker   ? Smokeless tobacco: Never Used   Substance and Sexual Activity   ? Alcohol use: Yes     Alcohol/week: 0.6 oz     Types: 1 Glasses of Wine (5 oz) per week     Comment: social    ? Drug use: No   ? Sexual activity: Yes     Partners: Male     Birth control/protection: Oral Contraceptive Pill, Withdrawal / Coitus Interruptus   Lifestyle   ? Physical activity:     Days per week: Not on file     Minutes per session: Not on file   ? Stress: Not on file   Relationships   ? Social connections:     Talks on phone: Not on file     Gets together: Not on file     Attends religious service: Not on file     Active member of club or organization: Not on file     Attends meetings of clubs or organizations: Not on file     Relationship status: Not on file   Social History Narrative   ? Not on file  Family History:  Family History   Problem Relation Age of Onset   ? Lupus Mother    ? Scleroderma Father    ? Arthritis Father    ? Breast cancer Maternal Grandmother 61   ? Gout Maternal Grandfather ? Stroke Paternal Grandmother    ? Arthritis Paternal Grandmother    ? Skin cancer Paternal Grandfather    ? Stroke Paternal Grandfather    ? Breast cancer Maternal Great-Grandmother        Medications/Allergies:  Medications that the patient states to be currently taking   Medication Sig   ? norgestimate-ethinyl estradiol (ORTHO TRI-CYCLEN, TRI SPRINTEC) 0.18/0.215/0.25 mg-35 mcg tablet TAKE 1 TABLET BY MOUTH EVERY DAY     No Known Allergies      Review of Systems:  A 14 system review was performed; all systems were negative except as documented above.    Physical Exam:  Vitals:    04/30/19 1135   BP: 113/75   Pulse: 95   Resp: 12   SpO2: 96%   Weight: 60.3 kg (133 lb)   Height: 1.6 m (5' 3'')     Body mass index is 23.56 kg/m?Marland Kitchen   System: Normal: Abnormal: Comments:   Const [x]  awake [x]  alert []  oriented x3  [x]  well appearing  []  NAD  []  well nourished  [x]  VS reviewed []  ill-appearing  []  overweight   []  underweight  []  obese    Eyes []  conjunctiva       []  PERRL  []  sclera               []  EOMI []  conjunctival pallor  []  scleral icterus  []  injected    ENMT   [x]  External ear      []  TM bilateral      []  external nose   []  nasal mucosa  []  nasal turbinates    []  external mouth []  oral mucosa []  cerumen impaction  []  canal drainage  []  effusion            []  bulging TM    []  septal deviation []  rhinorrhea    []  tonsillar exudates  []  oropharyngeal erythema  []  poor dentition    Neck [x]  supple       [x]  trachea midline  [x]  thyroid []  thyromegaly/goiter  []  thyroid nodule    Resp [x]  no resp distress  [x]  air movement  [x]  CTAB []  using accessory muscles  []  wheezing    []  rhonchi  []  rales          []  diminished BS    CV [x]  RRR [x]  S1/S2 present []  no JVD  [x]  no murmur/rub/gallop  []  no carotid bruit  []  radial pulses  []  DP/PT pulses []  murmur  []  rub  []  gallop  []  carotid bruit   []  diminished pulses  []  lower extremity edema    Chest []  without deformity     GI [x]  BS  [x]  Soft  [x]  NT  [x]  ND [] no hepatosplenomegaly BS: []  diminished []  hyperactive  []  distended    []   rigid  []  tender        []  rebound  []  guarding    []  HSM  []  Murphy's   []  McBurney    GU []  deferred  Pelvic:  Normal appearing external female genitalia, normal vaginal mucosa, no abnormal discharge. Normal appearing cervix, mild bleeding with pap sampling. No anoperineal lesions visible.  A thin prep pap smear, bacterial vaginosis  swab, fungal culture and GC/Chlamydia screen were obtained.     Lymph Cervical:       [x]  anterior [x]  posterior       [x]  submandibular [x]  periauricular  [x]  supraclavicular  []  axillary    []  inguinal Cervical:       []  anterior []  posterior  []  submandibular []  periauricular  []  supraclavicular  []  axillary    []  inguinal    MSK [x]  gait and station  [x]  extremity ROM []  spinal tenderness  []  paraspinal tenderness  []  open can  []  lift off     Skin [x]  color for stated race  [x]  no rash  []  no lesions     Neuro [x]  CN II-XII    [x]  sensation  Strength: moving all extremities      []  RUE    []  LUE      []  RLE    []  LLE  Reflexes:      []  biceps  []  triceps      []  patellar   []  Achilles     Psych [x]  affect  [x]  mood  [x]  judgement  [x]  insight  [x]  fund of knowledge  [x]  psychomotor Affect:      []  tearful  []  blunted  []  flat      []  labile  []  euphoric  []  depressed mood  []  anxious  []  agitated  []  combative  []  memory             Labs/imaging:  I have:  [] Reviewed/ordered radiology  [x] reviewed/ordered labs  [] reviewed/ordered diag med test  [] reviewed & summarized old records  [] reviewed outside medical records provided by the patient  [] reviewed outside medical records available in [facility]  [] requested outside medical records.    Lab Results   Component Value Date    WBC 8.54 04/28/2018    HGB 14.1 04/28/2018    HCT 43.5 04/28/2018    MCV 88.4 04/28/2018    PLT 291 04/28/2018     Lab Results   Component Value Date    CREAT 0.68 04/28/2018    BUN 10 04/28/2018    NA 140 04/28/2018 K 4.2 04/28/2018    CL 101 04/28/2018    CO2 26 04/28/2018     Lab Results   Component Value Date    ALT 15 04/28/2018    AST 20 04/28/2018    ALKPHOS 54 04/28/2018    BILITOT 0.3 04/28/2018     Lab Results   Component Value Date    CHOL 239 04/28/2018    CHOLDLCAL 114 (H) 04/28/2018    CHOLHDL 85 04/28/2018    TRIGLY 198 (H) 04/28/2018    NOHDLCHOCAL 154 (H) 04/28/2018     Lab Results   Component Value Date    HGBA1C 5.4 04/28/2018     Lab Results   Component Value Date    TSH 0.94 04/28/2018       No results found for: HIVABSCN, CHLAMPCR, HCVABSCN      2018 ACC/AHA guidelines recommends that patient is not in statin benefit group. Encourage adherence to heart-healthy lifestyle.    10-year ASCVD risk  cannot be calculated because at least one required variable is not available in [facility]  as of 8:58 PM on 04/30/2019  10-year ASCVD risk with optimal risk factors cannot be calculated.  Values used to calculate ASCVD score:  Age: 33 y.o. Cannot calculate risk because age is not between 40 and 4 years old.  Gender: Female Race:  Not African American.  HDL cholesterol: 85 mg/dL. HDL cholesterol measured 367 days ago.  Total cholesterol: 239 mg/dL. Total cholesterol measured 367 days ago.  LDL cholesterol: 114 mg/dL. LDL cholesterol measured 367 days ago.  Systolic BP: 113 mm Hg. BP was measured today.  The patient is not being treated with a medication that influences SBP.  The patient is currently not a smoker.  The patient does not have a diagnosis of diabetes.    Click here for the [facility] ASCVD Cardiovascular Risk Estimator Plus tool Office manager).    Assessment and Plan:  32 y.o. yo female with:    1. Well woman exam with routine gynecological exam    2. Hypercholesterolemia    3. Blurry vision, right eye    4. Screening for eye condition    5. Need for immunization against influenza    6. Suspected sleep apnea    7. Need for hepatitis C screening test    8. Pre-procedure lab exam #Annual Wellness Visit  Counseling/Screening:  [x]  I counseled on diet and exercise.  [x]  I counseled on routine dental care.  [x]  I counseled on regular eye exams.  []  I counseled on safe sexual practices.  []  I counseled on PrEP for HIV.  [x]  I counseled on sunscreen use  [x]  Alcohol abuse screening performed.  [x]  Depression screening performed.  [x]  Anxiety screening performed.  []  Domestic Violence screening performed.  []  Fall risk screening performed  []  Fall risk reduction counseling given.  []  0.4-0.8mg  Folic acid recommended Immunizations:  [x]  Flu vaccination counseling given.  [x]  Td/Tdap vaccination counseling given.  []  Hepatitis A vaccination counseling given.  []  Hepatitis B vaccination counseling given.  []  Pnuemonia vaccination counseling given.  []  Shingles vaccination counseling given.  []  HPV vaccination counseling given.   Exam:  [x]  Blood pressure checked and       [x]  acceptable, []  elevated  [x]  BMI checked and patient is       []  underweight, [x]  normal weight       []  overweight, []  obese  []  Pelvic exam, PAP/HPV discussed Lab Testing:  [x]  Hemoglobin A1c  [x]  Lipid screen  [x]  Hepatitis C screen  [x]  STI screening  []  Prostate cancer screening including DRE and PSA were discussed.   Imaging:  []  Colonoscopy  []  Mammogram  []  DEXA  []  AAA screening US  []  Low dose CT for lung cancer screening      Needs Tdap at next visit, not currently available, out of stock today.    The following separately identifiable issues were evaluated during this visit:     #hypercholesterolemia  Check lipids, A1c, CMP, UA, TSH    #suspected sleep apnea  Referral to Sleep Medicine    Orders Placed This Encounter   ? Fungal Culture, Genital Swab   ? Bacterial Vaginosis Screen   ? Chlamydia trachomatis/Neisseria gonorrhoeae PCR, Genital   ? HPV DNA PCR,Genital   ? Influenza vaccine IM;   PF   ? Basic Metabolic Panel   ? Hepatic Funct Panel   ? Lipid Panel   ? Hgb A1c   ? TSH with reflex FT4, FT3 ? Urinalysis w/Reflex to Culture   ? UA,Dipstick   ? UA,Microscopic   ? Referral to Ophthalmology   ? Referral for Sleep Consultation   ? Pap Smear   ? Pap Smear   ? HCV Antibody Screen         Health Maintenance:  Health Maintenance  Topic Date Due   ? Hepatitis C Screening  09/01/2004   ? Tdap/Td Vaccine (1 - Tdap) 09/01/2005   ? Cervical Ca Screening: PAP Smear  05/16/2019   ? Cervical Ca Screening: HPV Testing  04/30/2019 (Originally 09/01/2016)   ? Influenza Vaccine  Completed   ? HIV Screening  Completed       Follow up:  No follow-ups on file.    The above plan of care including diagnosis, orders, and follow-up was discussed with the patient, who expressed understanding and agreement. Any questions regarding the plan of care were answered during the visit.    Author:  Waverly Ferrari. Dub Mikes, MD 04/30/2019 8:58 PM  Clinical Instructor  [employer] [place]  Department of [facility]    This note was in part dictated. Please excuse any typos as a result.

## 2019-05-01 ENCOUNTER — Ambulatory Visit: Payer: BLUE CROSS/BLUE SHIELD

## 2019-05-01 LAB — TSH with reflex FT4, FT3: TSH: 1.1 u[IU]/mL (ref 0.3–4.7)

## 2019-05-01 LAB — COVID-19 PCR

## 2019-05-01 LAB — UA,Microscopic: WBCS HPF: 21 {cells}/[HPF] — ABNORMAL HIGH (ref 0–4)

## 2019-05-01 LAB — HCV Ab Screen: HCV ANTIBODY SCREEN: NONREACTIVE

## 2019-05-01 LAB — Lipid Panel: NON-HDL,CHOLESTEROL,CALC: 133 mg/dL — ABNORMAL HIGH (ref <130)

## 2019-05-01 LAB — Basic Metabolic Panel
CHLORIDE: 103 mmol/L (ref 96–106)
POTASSIUM: 4.4 mmol/L (ref 3.6–5.3)

## 2019-05-01 LAB — Fungal Culture: FUNGAL CULTURE: NEGATIVE

## 2019-05-01 LAB — UA,Dipstick: PROTEIN: NEGATIVE (ref 5.0–8.0)

## 2019-05-01 LAB — Hepatic Funct Panel: BILIRUBIN,CONJUGATED: <0.2 mg/dL (ref <=0.3)

## 2019-05-01 LAB — Hgb A1c: HGB A1C - HPLC: 5.4 (ref <5.7)

## 2019-05-01 LAB — Bacterial Vaginosis Screen

## 2019-05-01 LAB — HPV DNA PCR: HPV TYPE 16: NEGATIVE

## 2019-05-02 ENCOUNTER — Telehealth: Payer: BLUE CROSS/BLUE SHIELD

## 2019-05-02 ENCOUNTER — Ambulatory Visit: Payer: BLUE CROSS/BLUE SHIELD

## 2019-05-02 DIAGNOSIS — Z01812 Encounter for preprocedural laboratory examination: Secondary | ICD-10-CM

## 2019-05-02 LAB — Chlamydia trachomatis/Neisseria gonorrhoeae PCR: CHLAMYDIA TRACHOMATIS PCR: NEGATIVE

## 2019-05-02 LAB — Bacterial Culture Urine: BACTERIAL CULTURE URINE: >100000 — AB

## 2019-05-02 NOTE — Telephone Encounter
Left VM to call back regarding urine culture.

## 2019-05-02 NOTE — Telephone Encounter
Call Back Request    MD: Dr. Tenna Delaine      Reason for call back: Pt is returning MD call, pt is requesting a CB today if possible.     Any Symptoms:  []  Yes  [x]  No      ? If yes, what symptoms are you experiencing:    o Duration of symptoms (how long):    o Have you taken medication for symptoms (OTC or Rx):      Patient or caller has been notified of the 24-48 hour turnaround time.

## 2019-05-03 LAB — Fungal Culture: FUNGAL CULTURE: NEGATIVE

## 2019-05-03 MED ORDER — AMOXICILLIN 875 MG PO TABS
875 mg | ORAL_TABLET | Freq: Two times a day (BID) | ORAL | 0 refills | Status: AC
Start: 2019-05-03 — End: ?

## 2019-05-03 NOTE — Progress Notes
Brief telephone call:    Feeling of need to urinate but then with hesitancy.  She has positive UA, and a urine culture that grew Enterococcus.  Symptoms are mild and given the presence of Enterococcus will treat with amoxicillin b.i.d. for 5 days.  Test of cure 2 weeks after completion of antibiotics.  We discussed side effects of antibiotics and I have recommended daily probiotic to prevent or in the event of diarrhea.  I have also recommended Pepto-Bismol p.r.n. or Imodium p.r.n. should she developed diarrhea.    No be routed to staff for scheduling of lab visit for repeat urine sample.    The patient agrees with this plan of care. The patient was given the opportunity to ask questions regarding the plan of care, which were answered during the call.

## 2019-05-06 NOTE — Consults
PULMONARY/SLEEP MEDICINE CONSULTATION     PCP: Resa Miner., MD  Referring Physician: Resa Miner., MD    Chief Sleep Complaint:   No chief complaint on file.      DIAGNOSTIC SLEEP TEST(s):  None on file     PULMONARY FUNCTION TEST(s):  None on file     CARDIODIAGNOSTIC EXAM(s):  None on file    History of Present Illness   [name] [name] is 33 y.o. 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 presents with suspected sleep apnea.    Patient is a referral from Resa Miner., MD.    ? Occupation: ***  ? Relevant Familial Medical History: No history of sleep disorders in family    ? Usual bedtime *** PM, falls asleep within *** minutes.  ? Once asleep, the patient reports sleep fragmentation *** times/night due to ***; falls back asleep ***  ? Wake-up time *** AM, out of bed time: ***, total time in bed: *** hours, estimated total sleep time: *** hours  ? Patient endorses taking *** daytime naps/week.  x*** in length  ? Patient and bed partner REPORTS***denies snoring, pauses in breathing and irregular breathing pattern during sleep.  ? Patient REPORTS***denies AM headache.    ? The patient DOES complain of non-restorative sleep, daytime fatigue, trouble focusing and concentrating, excessive daytime sleepiness which adversely affects work/school performance, relationships, quality of life.  ? No***REPORTS sleepiness with driving, no motor vehicle collisions due to drowsy driving.  States understanding to pull aside safely on side of road should patient become sleepy behind wheel.  ? Epworth Scale Score (ESS): ***/24  ? STOP BANG:   ***/8: Snoring, Tired, Observed Apnea, blood Pressure, BMI>35 There is no height or weight on file to calculate BMI., Age>50, Neck circumference, Gender   ? PHQ9:   ***/27           PHQ Score Depression Severity   1-4 Minimal 5-9 Mild   10-14 Moderate   15-19 Moderately Severe   20-27 Severe     ? Patient REPORTS/***denies nicotine use.  ? Patient REPORTS/***denies caffeine use.   ? Patient REPORTS/***denies alcohol use.   ? Patient REPORTS/***denies sedative/hypnotic medicines.  ? Patient REPORTS/***denies stimulant medicines.  ? Patient REPORTS/***denies psychoactive medicines.  ? Patient REPORTS/***denies herbal supplements.     ? The patient DOES/***does not report the urge to move the legs or irritating feeling in the legs that prevent patient from falling asleep.   ? The patient DOES/***does not report regular nightmares.  ? The patient DOES/***does not have current symptoms suggestive of sleep paralysis, cataplexy, hypnagogic or hypnopompic hallucinations.  ? REPORTS/***No history of abnormal behaviors during sleep.    ? The patient DOES/***does not report allergic rhinitis symptoms of nasal congestion, post-nasal drip, rhinorrhea, sneezing.  ? The patient DOES/***does not report symptoms suggestive of gastroesophageal reflux such as heartburn, sour brash, bitter taste when awakening.    Past Medical History     Past Medical History:   Diagnosis Date   ? Chicken pox    ? GERD (gastroesophageal reflux disease)    ? 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 Seasonal allergies      Past Surgical History     Past Surgical History:   Procedure Laterality Date   ? WISDOM TOOTH EXTRACTION  Social History     Social History     Tobacco Use   ? Smoking status: Never Smoker   ? Smokeless tobacco: Never Used   Substance Use Topics   ? Alcohol use: Yes     Alcohol/week: 0.6 oz     Types: 1 Glasses of Wine (5 oz) per week     Comment: social    ? Drug use: No     Family History     Family History   Problem Relation Age of Onset   ? Lupus Mother    ? Scleroderma Father    ? Arthritis Father    ? Breast cancer Maternal Grandmother 44   ? Gout Maternal Grandfather    ? Stroke Paternal Grandmother ? Arthritis Paternal Grandmother    ? Skin cancer Paternal Grandfather    ? Stroke Paternal Grandfather    ? Breast cancer Maternal Great-Grandmother      Allergy   No Known Allergies  Outpatient Medications     No outpatient medications have been marked as taking for the 05/08/19 encounter (Appointment) with Corena Herter., MD.     Review of Systems   Pertinent review of systems as detailed in the HPI. The rest of the complete review of systems is negative except for those bolded below:    CONSTITUTIONAL: fever, weight loss, weight gain, fatigue, excessive daytime sleepiness  ENT: blurred vision, double vision, loss of vision, eye pain, eye redness, trouble hearing, ringing in ears, dizziness, loss of balance, ear pain, ear discharge, hoarseness, trouble swallowing, slurred speech  CARD: nonactive chest pain, irregular heart beat, fast heart beat, limb swelling, limb pain on walking, fainting  RESP: trouble breathing, chronic cough, coughing blood  GI: indigestion, heart burn, abdominal pain, nausea, vomiting, regurgitation, diarrhea, constipation, bloody stools  GU: incontinence, pain on urination, blood in urine  MSK: muscle pain, muscle cramp, muscle twitches, loss of muscle bulk, neck pain, back pain, joint pain, limb swelling, joint stiffness, joint swelling  SKIN: numbness, tingling, abnormal discoloration, skin rash  NEURO: headache, face pain, numbness, weakness, tremors, clumsiness, blackouts, trouble with memory, trouble concentrating  PSYCH: hallucinations, feeling depressed, trouble sleeping, suicidal thoughts, inappropriate crying, inappropriate laughing  HEME/LYMPH: abnormal bleeding, nose bleeds, lumps or swelling  ENDO: excessive thirst, heat/cold intolerance, excessive urination, dry eyes, dry mouth    Physical Exam   There were no vitals taken for this visit. There is no height or weight on file to calculate BMI.  General: Well-developed female, in no acute distress. HEENT: Normocephalic and atraumatic. Pupils are equal, round, and reactive to light. Extraocular movements intact.  Eyes with no exudates or lesions. Bilateral pinna without lesions.  Neck: Supple.   Chest: Clear to auscultation bilaterally. No crackles. No wheezes. No accessory muscle use.   Cardiovascular: Regular rate and rhythm. Normal S1, S2. No murmur, rub or gallop. No heave.  Skin: No facial lesions  Extremities: No significant lower extremity edema. No clubbing or cyanosis.   Neuro: Appropriately interactive. No focal deficits. Speech fluent.   Psych: Alert and oriented x 3     Sleep Exam   NECK SIZE: *** inches  HEENT: Reveals a Modified Mallampati Class Type: Class ***III-IV.      Tonsils Grade: ***    MOLAR OCCLUSION: Class ***     Labs     Lab Results   Component Value Date    WBC 8.54 04/28/2018    HGB 14.1 04/28/2018    HCT 43.5 04/28/2018  MCV 88.4 04/28/2018    PLT 291 04/28/2018     Chemistry         Component                Value               Date/Time                 NA                       143                 04/30/2019 1327           K                        4.4                 04/30/2019 1327           CL                       103                 04/30/2019 1327           CO2                      28                  04/30/2019 1327           BUN                      9                   04/30/2019 1327           Component                Value               Date/Time                 CALCIUM                  9.6                 04/30/2019 1327           ALKPHOS                  50                  04/30/2019 1327           AST                      21                  04/30/2019 1327           ALT                      12                  04/30/2019 1327           BILITOT                  0.3  04/30/2019 1327         Lab Results   Component Value Date    CREAT 0.74 04/30/2019     Lab Results   Component Value Date    TSH 1.1 04/30/2019     Hgb A1c - HPLC Date Value Ref Range Status   04/30/2019 5.4 <5.7 % Final     Comment:     For patients with diabetes, an A1c less than (<) or equal (=) to 7.0% is recommended for most patients, however the goal may be higher or lower depending on age and/or other medical problems.   For a diagnosis of diabetes, A1c greater than (>) or equal(=) to 6.5% indicates diabetes; values between 5.7% and 6.4% may indicate an increased risk of developing diabetes.   04/28/2018 5.4 <5.7 % Final     Comment:     For patients with diabetes, an A1c less than (<) or equal (=) to 7.0% is recommended for most patients, however the goal may be higher or lower depending on age and/or other medical problems.   For a diagnosis of diabetes, A1c greater than (>) or equal(=) to 6.5% indicates diabetes; values between 5.7% and 6.4% may indicate an increased risk of developing diabetes.      No results found for: FERRITIN    Compliance Data   Date range: *** to ***  Days with device usage = ***/30 days  (***%)  Average usage (days used) = *** hrs *** min  % days with usage > 4 hours = ***/30 days (***%)    CPAP pressure = *** cm H2O  BiPAP pressure = *** / *** cm H2O  APAP pressures =   *** cm H20  Resmed/Respironics SN    Auto PAP median pressure = *** cm H2O  Average device 95th percentile pressure = *** cm H2O  Auto PAP max pressure = *** cm H2O  Auto PAP mean pressure = *** cm H2O  Auto PAP peak pressure =  *** cm H2O  Average device 90th percentile pressure = *** cm H2O    Leak median:  *** L/min  Leak 95th %ile: *** L/min  Leak max: *** L/min  Average Time in large leak per day = *** min *** secs    Average AHI = ***  CURES Data   ***  I attest I have reviewed Cures database for patient. No record on file for patient.  Assessment     33 y.o. 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 with ***    Plan Suspected sleep disordered breathing (SDB):  ? Patient with ***  ? Patient with *** pretest probability: STOP BANG ***/8  ? Proceed with un***attended polysomnogram (PSG)***/home sleep apnea test (HSAT). This is based on [facility] clinical guidelines for diagnostic testing for adult OSA with strong recommendation. Maryruth Bun, et al. Mallie Mussel. 2017;13(3):479-583).  This is per practice parameters laid out by [facility] (Kushida, et al. Sleep. 2005;28(4):499-519).  ? We discussed how the HST is not a sensitive test and hence usually for a negative result an in-lab PSG is recommended to truly rule out SDB.  ? We discussed the pathophysiology of obstructive sleep apnea (OSA). We discussed health risks associated with untreated obstructive sleep apnea including hypertension, refractory hypertension, coronary artery disease, heart failure, cardiac dysrhythmia, stroke, insulin resistance as well as the potential to exacerbate underlying mood disorders.  ?  We reviewed treatment options for obstructive sleep apnea including: weight loss, positional therapy (elevate head of bed, maintain sleep in lateral positions), oral appliance therapy, surgery, upper airway cranial nerve XII (hypoglossal) stimulator (Inspire device) treatment, and positive airway pressure (PAP) therapy.  ? If sleep study expresses AHI between 5 and 14, treatment warranted based on patient's comorbidities: excessive daytime sleepiness***, impaired cognition***, mood disorder***, insomnia***, hypertension***, ischemic heart disease***, and history of stroke***.    ? Patient was recommended to try sleeping on side, elevating head and avoiding alcohol at night to improve severity of SDB.  In addition, we discussed that losing 10% of body weight may improve severity of SDB. Patient was also advised not to operate heavy machinery while sleepy. ? Patient given handout outlining timeline and next steps in current sleep evaluation.  Sources of contact provided on handout.  Applicable EMMI videos made available for patient for access online.    SYMPTOM DOCUMENTATION IN SUPPORT OF SLEEP APNEA DIAGNOSIS:  Evidence of Excessive Daytime Sleepiness  []  Disturbed or restless sleep  []  Non-restorative sleep  []  Frequent unexplained arousals from sleep  []  Fragmented sleep  []  Epworth Sleepiness Scale (ESS) greater than or equal to 10  []  Fatigue  Evidence Suggestive of Sleep Disordered Breathing  []  Habitual loud snoring  []  Witnessed apneas during sleep  []  Choking or gasping during sleep  []  BMI greater than or equal to 30  []  Neck circumference greater than 17 inches (men) or greater than16 inches (women)  []  Sleep related bruxism  []  Cognitive deficits such as inattention or memory  []  Unexplained nocturnal reflux  []  Erectile dysfunction  []  Apneas or hypoxemia during procedures requiring anesthesia  []  Morning headaches    Diagnosis  ?     Sleep hygiene:  ? We discussed optimal sleep hygiene practices including maintaining a set schedule, consistent light exposure in the morning hours, minimizing alerting activities prior to bedtime, avoiding spending excessive time in bed while awake, and consistent wake up time.    I discussed the plan in detail with the patient who voiced understanding and is in agreement.    *More than 50% of this visit was spent in patient care, in counseling and coordination of care.  *Previous labs, imaging, procedures, and notes, if listed above, were reviewed prior to clinic visit.    Follow up: ~*** 2-3 weeks post sleep study    Thank you for the consult.    Electronic Physician Signature:   Hanya Guerin X. Blake Divine, MD  Clinical Instructor of Medicine  Sleep Medicine  05/05/2019 8:49 PM

## 2019-05-08 ENCOUNTER — Telehealth: Payer: BLUE CROSS/BLUE SHIELD

## 2019-05-08 ENCOUNTER — Ambulatory Visit: Payer: BLUE CROSS/BLUE SHIELD

## 2019-05-08 LAB — Liquid-based pap smear: LAB AP GYN INTERPRETATION: NEGATIVE

## 2019-05-08 NOTE — Telephone Encounter
Per pt- would like to see Dr. Rigoberto Noel, as times work best for pt's schedule.       Patient is wondering if the appointment can be a Video Visit. MD please advise.

## 2019-05-08 NOTE — Addendum Note
Addended by: Anna Genre HOCK on: 05/08/2019 12:39 PM     Modules accepted: Orders

## 2019-05-08 NOTE — Consults
PULMONARY/SLEEP MEDICINE CONSULTATION     PCP: Resa Miner., MD  Referring Physician: Resa Miner., MD    Chief Sleep Complaint:   No chief complaint on file.      DIAGNOSTIC SLEEP TEST(s):  None on file     PULMONARY FUNCTION TEST(s):  None on file     CARDIODIAGNOSTIC EXAM(s):  None on file    History of Present Illness   [name] [name] is 33 y.o. 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 presents with suspected sleep apnea.    Patient is a referral from Resa Miner., MD.    ? Occupation: ***  ? Relevant Familial Medical History: No history of sleep disorders in family    ? Usual bedtime *** PM, falls asleep within *** minutes.  ? Once asleep, the patient reports sleep fragmentation *** times/night due to ***; falls back asleep ***  ? Wake-up time *** AM, out of bed time: ***, total time in bed: *** hours, estimated total sleep time: *** hours  ? Patient endorses taking *** daytime naps/week.  x*** in length  ? Patient and bed partner REPORTS***denies snoring, pauses in breathing and irregular breathing pattern during sleep.  ? Patient REPORTS***denies AM headache.    ? The patient DOES complain of non-restorative sleep, daytime fatigue, trouble focusing and concentrating, excessive daytime sleepiness which adversely affects work/school performance, relationships, quality of life.  ? No***REPORTS sleepiness with driving, no motor vehicle collisions due to drowsy driving.  States understanding to pull aside safely on side of road should patient become sleepy behind wheel.  ? Epworth Scale Score (ESS): ***/24  ? STOP BANG:   ***/8: Snoring, Tired, Observed Apnea, blood Pressure, BMI>35 There is no height or weight on file to calculate BMI., Age>50, Neck circumference, Gender   ? PHQ9:   ***/27           PHQ Score Depression Severity   1-4 Minimal 5-9 Mild   10-14 Moderate   15-19 Moderately Severe   20-27 Severe     ? Patient REPORTS/***denies nicotine use.  ? Patient REPORTS/***denies caffeine use.   ? Patient REPORTS/***denies alcohol use.   ? Patient REPORTS/***denies sedative/hypnotic medicines.  ? Patient REPORTS/***denies stimulant medicines.  ? Patient REPORTS/***denies psychoactive medicines.  ? Patient REPORTS/***denies herbal supplements.     ? The patient DOES/***does not report the urge to move the legs or irritating feeling in the legs that prevent patient from falling asleep.   ? The patient DOES/***does not report regular nightmares.  ? The patient DOES/***does not have current symptoms suggestive of sleep paralysis, cataplexy, hypnagogic or hypnopompic hallucinations.  ? REPORTS/***No history of abnormal behaviors during sleep.    ? The patient DOES/***does not report allergic rhinitis symptoms of nasal congestion, post-nasal drip, rhinorrhea, sneezing.  ? The patient DOES/***does not report symptoms suggestive of gastroesophageal reflux such as heartburn, sour brash, bitter taste when awakening.    Past Medical History     Past Medical History:   Diagnosis Date   ? Chicken pox    ? GERD (gastroesophageal reflux disease)    ? 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 Seasonal allergies      Past Surgical History     Past Surgical History:   Procedure Laterality Date   ? WISDOM TOOTH EXTRACTION  Social History     Social History     Tobacco Use   ? Smoking status: Never Smoker   ? Smokeless tobacco: Never Used   Substance Use Topics   ? Alcohol use: Yes     Alcohol/week: 0.6 oz     Types: 1 Glasses of Wine (5 oz) per week     Comment: social    ? Drug use: No     Family History     Family History   Problem Relation Age of Onset   ? Lupus Mother    ? Scleroderma Father    ? Arthritis Father    ? Breast cancer Maternal Grandmother 44   ? Gout Maternal Grandfather    ? Stroke Paternal Grandmother ? Arthritis Paternal Grandmother    ? Skin cancer Paternal Grandfather    ? Stroke Paternal Grandfather    ? Breast cancer Maternal Great-Grandmother      Allergy   No Known Allergies  Outpatient Medications     No outpatient medications have been marked as taking for the 05/11/19 encounter (Appointment) with Corena Herter., MD.     Review of Systems   Pertinent review of systems as detailed in the HPI. The rest of the complete review of systems is negative except for those bolded below:    CONSTITUTIONAL: fever, weight loss, weight gain, fatigue, excessive daytime sleepiness  ENT: blurred vision, double vision, loss of vision, eye pain, eye redness, trouble hearing, ringing in ears, dizziness, loss of balance, ear pain, ear discharge, hoarseness, trouble swallowing, slurred speech  CARD: nonactive chest pain, irregular heart beat, fast heart beat, limb swelling, limb pain on walking, fainting  RESP: trouble breathing, chronic cough, coughing blood  GI: indigestion, heart burn, abdominal pain, nausea, vomiting, regurgitation, diarrhea, constipation, bloody stools  GU: incontinence, pain on urination, blood in urine  MSK: muscle pain, muscle cramp, muscle twitches, loss of muscle bulk, neck pain, back pain, joint pain, limb swelling, joint stiffness, joint swelling  SKIN: numbness, tingling, abnormal discoloration, skin rash  NEURO: headache, face pain, numbness, weakness, tremors, clumsiness, blackouts, trouble with memory, trouble concentrating  PSYCH: hallucinations, feeling depressed, trouble sleeping, suicidal thoughts, inappropriate crying, inappropriate laughing  HEME/LYMPH: abnormal bleeding, nose bleeds, lumps or swelling  ENDO: excessive thirst, heat/cold intolerance, excessive urination, dry eyes, dry mouth    Physical Exam   There were no vitals taken for this visit. There is no height or weight on file to calculate BMI.  General: Well-developed female, in no acute distress. HEENT: Normocephalic and atraumatic. Pupils are equal, round, and reactive to light. Extraocular movements intact.  Eyes with no exudates or lesions. Bilateral pinna without lesions.  Neck: Supple.   Chest: Clear to auscultation bilaterally. No crackles. No wheezes. No accessory muscle use.   Cardiovascular: Regular rate and rhythm. Normal S1, S2. No murmur, rub or gallop. No heave.  Skin: No facial lesions  Extremities: No significant lower extremity edema. No clubbing or cyanosis.   Neuro: Appropriately interactive. No focal deficits. Speech fluent.   Psych: Alert and oriented x 3     Sleep Exam   NECK SIZE: *** inches  HEENT: Reveals a Modified Mallampati Class Type: Class ***III-IV.      Tonsils Grade: ***    MOLAR OCCLUSION: Class ***     Labs     Lab Results   Component Value Date    WBC 8.54 04/28/2018    HGB 14.1 04/28/2018    HCT 43.5 04/28/2018  MCV 88.4 04/28/2018    PLT 291 04/28/2018     Chemistry         Component                Value               Date/Time                 NA                       143                 04/30/2019 1327           K                        4.4                 04/30/2019 1327           CL                       103                 04/30/2019 1327           CO2                      28                  04/30/2019 1327           BUN                      9                   04/30/2019 1327           Component                Value               Date/Time                 CALCIUM                  9.6                 04/30/2019 1327           ALKPHOS                  50                  04/30/2019 1327           AST                      21                  04/30/2019 1327           ALT                      12                  04/30/2019 1327           BILITOT                  0.3  04/30/2019 1327         Lab Results   Component Value Date    CREAT 0.74 04/30/2019     Lab Results   Component Value Date    TSH 1.1 04/30/2019     Hgb A1c - HPLC Date Value Ref Range Status   04/30/2019 5.4 <5.7 % Final     Comment:     For patients with diabetes, an A1c less than (<) or equal (=) to 7.0% is recommended for most patients, however the goal may be higher or lower depending on age and/or other medical problems.   For a diagnosis of diabetes, A1c greater than (>) or equal(=) to 6.5% indicates diabetes; values between 5.7% and 6.4% may indicate an increased risk of developing diabetes.   04/28/2018 5.4 <5.7 % Final     Comment:     For patients with diabetes, an A1c less than (<) or equal (=) to 7.0% is recommended for most patients, however the goal may be higher or lower depending on age and/or other medical problems.   For a diagnosis of diabetes, A1c greater than (>) or equal(=) to 6.5% indicates diabetes; values between 5.7% and 6.4% may indicate an increased risk of developing diabetes.      No results found for: FERRITIN    Compliance Data   Date range: *** to ***  Days with device usage = ***/30 days  (***%)  Average usage (days used) = *** hrs *** min  % days with usage > 4 hours = ***/30 days (***%)    CPAP pressure = *** cm H2O  BiPAP pressure = *** / *** cm H2O  APAP pressures =   *** cm H20  Resmed/Respironics SN    Auto PAP median pressure = *** cm H2O  Average device 95th percentile pressure = *** cm H2O  Auto PAP max pressure = *** cm H2O  Auto PAP mean pressure = *** cm H2O  Auto PAP peak pressure =  *** cm H2O  Average device 90th percentile pressure = *** cm H2O    Leak median:  *** L/min  Leak 95th %ile: *** L/min  Leak max: *** L/min  Average Time in large leak per day = *** min *** secs    Average AHI = ***  CURES Data   ***  I attest I have reviewed Cures database for patient. No record on file for patient.  Assessment     33 y.o. 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 with ***    Plan Suspected sleep disordered breathing (SDB):  ? Patient with ***  ? Patient with *** pretest probability: STOP BANG ***/8  ? Proceed with un***attended polysomnogram (PSG)***/home sleep apnea test (HSAT). This is based on AASM clinical guidelines for diagnostic testing for adult OSA with strong recommendation. Maryruth Bun, et al. Mallie Mussel. 2017;13(3):479-583).  This is per practice parameters laid out by AASM (Kushida, et al. Sleep. 2005;28(4):499-519).  ? We discussed how the HST is not a sensitive test and hence usually for a negative result an in-lab PSG is recommended to truly rule out SDB.  ? We discussed the pathophysiology of obstructive sleep apnea (OSA). We discussed health risks associated with untreated obstructive sleep apnea including hypertension, refractory hypertension, coronary artery disease, heart failure, cardiac dysrhythmia, stroke, insulin resistance as well as the potential to exacerbate underlying mood disorders.  ?  We reviewed treatment options for obstructive sleep apnea including: weight loss, positional therapy (elevate head of bed, maintain sleep in lateral positions), oral appliance therapy, surgery, upper airway cranial nerve XII (hypoglossal) stimulator (Inspire device) treatment, and positive airway pressure (PAP) therapy.  ? If sleep study expresses AHI between 5 and 14, treatment warranted based on patient's comorbidities: excessive daytime sleepiness***, impaired cognition***, mood disorder***, insomnia***, hypertension***, ischemic heart disease***, and history of stroke***.    ? Patient was recommended to try sleeping on side, elevating head and avoiding alcohol at night to improve severity of SDB.  In addition, we discussed that losing 10% of body weight may improve severity of SDB. Patient was also advised not to operate heavy machinery while sleepy. ? Patient given handout outlining timeline and next steps in current sleep evaluation.  Sources of contact provided on handout.  Applicable EMMI videos made available for patient for access online.    SYMPTOM DOCUMENTATION IN SUPPORT OF SLEEP APNEA DIAGNOSIS:  Evidence of Excessive Daytime Sleepiness  []  Disturbed or restless sleep  []  Non-restorative sleep  []  Frequent unexplained arousals from sleep  []  Fragmented sleep  []  Epworth Sleepiness Scale (ESS) greater than or equal to 10  []  Fatigue  Evidence Suggestive of Sleep Disordered Breathing  []  Habitual loud snoring  []  Witnessed apneas during sleep  []  Choking or gasping during sleep  []  BMI greater than or equal to 30  []  Neck circumference greater than 17 inches (men) or greater than16 inches (women)  []  Sleep related bruxism  []  Cognitive deficits such as inattention or memory  []  Unexplained nocturnal reflux  []  Erectile dysfunction  []  Apneas or hypoxemia during procedures requiring anesthesia  []  Morning headaches    Diagnosis  ?     Sleep hygiene:  ? We discussed optimal sleep hygiene practices including maintaining a set schedule, consistent light exposure in the morning hours, minimizing alerting activities prior to bedtime, avoiding spending excessive time in bed while awake, and consistent wake up time.    I discussed the plan in detail with the patient who voiced understanding and is in agreement.    *More than 50% of this visit was spent in patient care, in counseling and coordination of care.  *Previous labs, imaging, procedures, and notes, if listed above, were reviewed prior to clinic visit.    Follow up: ~*** 2-3 weeks post sleep study    Thank you for the consult.    Electronic Physician Signature:   Anglia Blakley X. Blake Divine, MD  Clinical Instructor of Medicine  Sleep Medicine  05/08/2019 11:47 AM

## 2019-05-10 NOTE — Telephone Encounter
If the patient is being seen for sleep, then the visit can be a video visit.  I have not seen this patient before, so if the issue a pulmonary issue, then a video vs in-person visit depends on the patient's problem.  Reply by: Deliah Goody. Rigoberto Noel

## 2019-05-11 ENCOUNTER — Telehealth: Payer: BLUE CROSS/BLUE SHIELD

## 2019-05-11 ENCOUNTER — Ambulatory Visit: Payer: BLUE CROSS/BLUE SHIELD

## 2019-05-11 DIAGNOSIS — R0681 Apnea, not elsewhere classified: Secondary | ICD-10-CM

## 2019-05-11 DIAGNOSIS — R400 Somnolence: Secondary | ICD-10-CM

## 2019-05-11 NOTE — Telephone Encounter
Confirmation Documentation    (confirmed) procedure(s)   Date: 05/18/2019  Time: 1:00 PM  Check-in Time: 12:00 PM     Colonoscopy     Endoscopy  X   Bravo X   Cath Placement    Sigmoidoscopy     Small Bowel Entero.    Esophageal Manometry    Anorectal Manometry    Biofeedback    pH study    Capsule Endoscopy    Secretin Infusion Test    Sham Feeding Study        Informed pt:  1. Of Arrival time (time varies based on location)? Y  2. Of location and room number?Y  3. Make sure there is a confirmed ride for the procedure.Y  4. Has procedure been authorized?Pending   5. Was MAC Script provided? Y  6. Reviewed Prep instructions with patient? Y/N?Y  7. Informed patient to call back with any questions and provided number.Y/N?Y    NOTE: If patients have any questions regarding prescribed medication patient needs to contact their prescribing physician to ensure it is ok to stop medications.      Comments/Notes: prep instructions sent to [employer] and email.        If VM is left and pt calls back, please warm transfer patient to [phone]. This number is for internal use only and is not to be provided to patients.

## 2019-05-11 NOTE — Telephone Encounter
Appointment completed at [place].

## 2019-05-11 NOTE — H&P
PATIENT: [name] [name]  MRN: [id number]  DOB: April 29, 1986  DATE OF SERVICE: 05/11/2019    CHIEF COMPLAINT:   Chief Complaint   Patient presents with   ? Problems Breathing       Subjective:      [name] [name] is a 33 y.o. female referred by Dr. Dub Mikes for possible sleep apnea   She has been tossing and turning at night.  Her boyfriend says she is gasping for air at night.  He told her 2 weeks ago that this was happening.  She sometimes feels tired in the morning.  She drinks coffee in the morning.   She normally goes to bed around midnight.  It takes her about 30 minutes to 45 minutes to fall asleep.   She has been waking up to go to the bathroom at least once to go to the bathroom, but when she goes, there is not much urine.  She works from home and wakes up between 7:30-9AM with a multiple alarm clocks.  She does not feel well rested when she wakes up.  She does not usually take naps during the day.  She does not find herself nodding off.  Her boyfriend said that it sounds like she stops breathing and that something obstructs her airway.  Her father is getting a sleep study.  Her mother snores and will stop breathing and gasp for air.    ESS: 7 (1, 1, 1, 1, 2, 1, 0, 0)    PMH:  Seasonal allergies  GERD  Herniation of nucleus polposus of lumbar disc with sciatica  Chicken pox    Social history:  Lifelong nonsmoker.  Denies drug use.  Drinks 1 alcoholic drink per week.  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  No pets.  Has lived in her current home since 01/2018    Family history:  Mother alive at age 54 with lupus vs Sjogrens.  Father alive at age 55 being tested for sleep apnea and has scleroderma and arthritis.  Brother healthy.  No children.    Medications that the patient states to be currently taking   Medication Sig   ? norgestimate-ethinyl estradiol (ORTHO TRI-CYCLEN, TRI SPRINTEC) 0.18/0.215/0.25 mg-35 mcg tablet TAKE 1 TABLET BY MOUTH EVERY DAY     No Known Allergies        Review of Systems Constitutional: Negative for chills, fever and unexpected weight change.   HENT: Positive for rhinorrhea. Negative for congestion.    Eyes: Negative for redness and itching.        Recent corneal abrasion   Respiratory: Negative for cough, choking, chest tightness, shortness of breath and wheezing.    Cardiovascular: Negative for chest pain and leg swelling.   Gastrointestinal: Negative for nausea and vomiting.        Feels like food gets stuck in her esophagus.   Endocrine: Positive for heat intolerance. Negative for cold intolerance.   Genitourinary: Positive for frequency and urgency. Negative for dysuria.   Musculoskeletal: Negative for arthralgias and myalgias.   Skin: Negative for rash and wound.   Allergic/Immunologic: Positive for environmental allergies. Negative for food allergies.   Neurological: Negative for dizziness and light-headedness.   Hematological: Negative for adenopathy. Bruises/bleeds easily (past few years).   Psychiatric/Behavioral: Negative for dysphoric mood. The patient is not nervous/anxious.          Objective:         Physical Exam  Constitutional:       Appearance: Normal  appearance. She is normal weight.   Pulmonary:      Effort: Pulmonary effort is normal.   Neurological:      General: No focal deficit present.      Mental Status: She is alert and oriented to person, place, and time.   Psychiatric:         Mood and Affect: Mood normal.         Behavior: Behavior normal.           Assessment:       ***      Plan:       - Home sleep study ordered  - Return to clinic 2-3 weeks after the sleep study is complete.          Author:  Tommy Rainwater. Lasonja Lakins 05/11/2019 8:37 AM

## 2019-05-15 NOTE — Telephone Encounter
appt scheduled as md requested

## 2019-05-16 ENCOUNTER — Telehealth: Payer: BLUE CROSS/BLUE SHIELD | Attending: Gastroenterology

## 2019-05-16 ENCOUNTER — Institutional Professional Consult (permissible substitution): Payer: BLUE CROSS/BLUE SHIELD

## 2019-05-16 DIAGNOSIS — N390 Urinary tract infection, site not specified: Secondary | ICD-10-CM

## 2019-05-16 DIAGNOSIS — R140 Abdominal distension (gaseous): Secondary | ICD-10-CM

## 2019-05-16 DIAGNOSIS — R12 Heartburn: Secondary | ICD-10-CM

## 2019-05-16 DIAGNOSIS — B952 Enterococcus as the cause of diseases classified elsewhere: Secondary | ICD-10-CM

## 2019-05-16 DIAGNOSIS — R1310 Dysphagia, unspecified: Secondary | ICD-10-CM

## 2019-05-16 DIAGNOSIS — Z01812 Encounter for preprocedural laboratory examination: Secondary | ICD-10-CM

## 2019-05-17 LAB — COVID-19 and Influenza A/B PCR: INFLUENZA B PCR: NOT DETECTED

## 2019-05-17 MED ORDER — OMEPRAZOLE 20 MG PO CPDR
20 mg | ORAL_CAPSULE | Freq: Two times a day (BID) | ORAL | 0 refills | Status: AC
Start: 2019-05-17 — End: ?

## 2019-05-17 NOTE — Patient Instructions
-   EGD and 96 hour bravo (off/on ppi) as scheduled  - during the ON PPI part take omeprazole 20mg  twice a day  - Off all PPIs 7 days before procedure, Stop H2 blockers like rantidine 48hrs before procedure, stop Gaviscon advanced and/or Tums 24 hours before procedure  - See you for your procedure

## 2019-05-17 NOTE — Progress Notes
[facility]  [address]  [place] [address]  Phone: [phone]  Fax: [phone]      REFERRING PRACTITIONER: No ref. provider found  PRIMARY CARE PROVIDER: Resa Miner., MD    Chief Complaint   Patient presents with   ? Follow-up   gerd, globus    Reason for consult/follow-up: gerd, globus, belching    Endoscopy since last visit: none    INTERVAL PROGRESS:  Still with feeling of lump in her throat and feeling of fullness about 2-3 hours later and belching.  Not on any antiacid medications for the past 2 years, but has made diet changes with some improvement in symptoms.  Pending egd with bravo 96 hour off/on ppi and manometry.    Past Medical History:   Diagnosis Date   ? Chicken pox    ? GERD (gastroesophageal reflux disease)    ? 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 Seasonal allergies        MEDICATIONS:  Medications that the patient states to be currently taking   Medication Sig   ? norgestimate-ethinyl estradiol (ORTHO TRI-CYCLEN, TRI SPRINTEC) 0.18/0.215/0.25 mg-35 mcg tablet TAKE 1 TABLET BY MOUTH EVERY DAY       ALLERGIES:  No Known Allergies    Lab Review:  Lab Results   Component Value Date    WBC 8.54 04/28/2018    HGB 14.1 04/28/2018    HCT 43.5 04/28/2018    MCV 88.4 04/28/2018    PLT 291 04/28/2018     Lab Results   Component Value Date    AST 21 04/30/2019    ALT 12 04/30/2019    ALKPHOS 50 04/30/2019    BILITOT 0.3 04/30/2019    ALBUMIN 4.4 04/30/2019    TOTPRO 6.9 04/30/2019     Lab Results   Component Value Date    NA 143 04/30/2019    K 4.4 04/30/2019    CL 103 04/30/2019    CO2 28 04/30/2019    BUN 9 04/30/2019    CREAT 0.74 04/30/2019    CALCIUM 9.6 04/30/2019     No results found for: SRWEST, CRP  No results found for: INR, FE, FEBINDSAT, TIBC, FERRITIN, VITAMINB12, FOLATE  No results found for: SRWEST, CRP, GLIADINIGA, GLIADINIGG, ENDOMAB, TRNGLUTIGAAB, IGASER No results found for: HELPYLAB, HELPYLAGSTL     PROCEDURES:            IMPRESSION / PLAN:  33 y.o.female  1. Globus sensation    2. Dysphagia, unspecified type    3. Heartburn    4. Bloating    Ongoing globus sensation, intermittent difficulty with swallowing. Eval for GERD with egd/bravo on/off ppi. Also bx for H.pylori and r/o celiac. Pending esophageal manometry. Discussed procedures in detail. All questions answered.        Plan:  Patient Instructions   - EGD and 96 hour bravo (off/on ppi) as scheduled  - during the ON PPI part take omeprazole 20mg  twice a day  - Off all PPIs 7 days before procedure, Stop H2 blockers like rantidine 48hrs before procedure, stop Gaviscon advanced and/or Tums 24 hours before procedure  - See you for your procedure        Orders Placed This Encounter   ? omeprazole 20 mg DR capsule        The above plan of care, diagnosis, orders, and follow-up were discussed with the patient.  Questions related to  this recommended plan of care were answered.    More than 50% of the 21-minute encounter time was spent with counseling and in the coordination of care for the contents and topics discussed above.       Thank you for this consult.  Please contact me if you have any questions.    Fransisco Beau, MD, MPH  [employer]  Department of Medicine, Gastroenterology  [facility] Division of Digestive Diseases  [facility]

## 2019-05-18 MED ORDER — SUCRALFATE 1 GM/10ML PO SUSP
1 g | Freq: Four times a day (QID) | ORAL | 1 refills | 11.00000 days | Status: AC
Start: 2019-05-18 — End: ?

## 2019-05-18 MED ADMIN — LIDOCAINE HCL 4 % EX SOLN: @ 21:00:00 | Stop: 2019-05-19 | NDC 00054350547

## 2019-05-18 MED ADMIN — SODIUM CHLORIDE 0.9 % IV SOLN: 10 mL/h | INTRAVENOUS | @ 21:00:00 | Stop: 2019-05-19 | NDC 00338004904

## 2019-05-18 MED ADMIN — LIDOCAINE HCL (PF) 1 % IJ SOLN: @ 21:00:00 | Stop: 2019-05-18 | NDC 63323049257

## 2019-05-18 MED ADMIN — PROPOFOL 200 MG/20ML IV EMUL: @ 21:00:00 | Stop: 2019-05-18 | NDC 63323026929

## 2019-05-18 NOTE — H&P
UPDATED H&P REQUIREMENT    For [place] [facility] and [facility]    WHAT IS THE STATUS OF THE PATIENT'S MOST CURRENT HISTORY AND PHYSICAL?   - The most current H&P is >24 hours and <30 days, and having examined the patient, I attest that there have been no changes. (This suffices as an update to the H&P).      REFER TO MEDICAL STAFF POLICIES REGARDING PRE-PROCEDURE HISTORY AND PHYSICAL EXAMINATION AND UPDATED H&P REQUIREMENTS BELOW:    [facility] and [facility] Medical Staff Policy 200 - For Patients Undergoing Procedures Requiring Moderate or Deep Sedation, General Anesthesia or Regional Anesthesia    Contents of a History and Physical Examination (H&P):    The H&P shall consist of chief complaint, history of present illness, allergies and medications, relevant social and family history, past medical history, review of systems and physical examination, and assessment and plan appropriate to the patient's age.    For Patients Undergoing Procedures Requiring Moderate or Deep Sedation, General Anesthesia or Regional Anesthesia:    1. An H&P shall be performed within 24 hours prior to the procedure by a qualified member of the medical staff or designee with appropriate privileges, except as noted in item 2 below.    2. If a complete history and physical was performed within thirty (30) calendar days prior to the patient's admission to the Medical Center for elective surgery, a member of the medical staff assumes the responsibility for the accuracy of the clinical information and will need to document in the medical record within twenty-four (24) hours of admission and prior to surgery or major invasive procedure, that they either attest that the history and physical has been reviewed and accepted, or document an update of the original history and physical relevant to the patient's current  clinical status.    3. Providing an H&P for patients undergoing surgery under local anesthesia is at the discretion of the Attending Physician.     4. When a procedure is performed by a dentist, podiatrist or other practitioner who is not privileged to perform an H&P, the anesthesiologist's assessment immediately prior to the procedure will constitute the 24 hour re-assessment.The dentist, podiatrist or other practitioner who is not privileged to perform an H&P will document the history and physical relevant to the procedure.    5. If the H&P and the written informed consent for the surgery or procedure are not recorded in the patient's medical record prior to surgery, the operation shall not be performed unless the attending physician states in writing that such a delay could lead to an adverse event or irreversible damage to the patient.    6. The above requirements shall not preclude the rendering of emergency medical or surgical care to a patient in dire circumstances.

## 2019-05-18 NOTE — Procedures
PATIENT NAME:       [name] [name], [name] [name]  DATE OF BIRTH:       08/08/1985  RECORD NUMBER:      [id number]  DATE/TIME OF PROCEDURE:     05/18/2019 / 01:00:00 PM  ENDOSCOPIST:       Earnestine Leys, MD  REFERRING PHYSICIAN:        FELLOW:           INDICATIONS FOR EXAMINATION:      GERD, globus, dysphagia. plan for bravo 96 hour off/on ppi.               PROCEDURE PERFORMED:             UPPER GI ENDOSCOPY    MEDICATIONS:    MAC anesthesia    PROCEDURE TECHNIQUE:  Informed consent obtained for the procedure including risks, benefits and alternatives and the risk of those alternatives. Informed consent obtained for sedation including risks, benefits and alternatives and the risk of those alternatives.    EKG, pulse, pulse oximetry and blood pressure were monitored throughout the procedure.     The endoscope was passed with ease through the mouth under direct visualization; it was extended to the 2nd portion of the duodenum. The scope was withdrawn and the mucosa was carefully examined. The views were excellent. The patient's toleration of the   procedure was excellent.    Measurements were taken with the Z-line at 38cm from the incisors. The Bravo system was prepared and appropriately marked (to be placed 6cm above Z-line). The catheter and device were passed into the mouth and esophagus without difficulty under   visualization with the endoscope. Suction was applied for 60 seconds then the capsule deployed and confirmed by endoscopy at 32cm from the incisors.    Total Withdrawl Time:   Total Insertion Time:                              EXTENT OF EXAM:  2nd part of the duodenum            INSTRUMENTS:   [id number] #[id number]  TECHNICAL DIFFICULTY:  No   LIMITATIONS:      None  TOLERANCE:   Good  VISUALIZATION:  Good    FINDINGS:   Esophagus: No esophagitis. No hiatal hernia. Pinch/EGF/Zline at 38cm. Biopsies taken in distal and proximal esophagus.  Bravo device deployed and attachment to esophageal wall confirmed endoscopically. Stomach: Mild antral gastropathy. One small antral erosion. No ulcers. Biopsies taken from antrum and body. The pylorus was patent. On retroflexion no additional findings were noted.   Duodenum: Normal mucosa in bulb and 2nd portion. Biopsies taken in 2nd portion.    ESTIMATED BLOOD LOSS:   None     DIAGNOSIS:  No esophagitis. No hiatal hernia. Pinch/EGF/Zline at 38cm. Biopsies taken in distal and proximal esophagus.  Mild antral gastropathy. One small antral erosion. No ulcers. Biopsies taken.  Normal duodenum. Biopsies taken in 2nd portion.   BRAVO capsule placed successfully.    RECOMMENDATIONS:  Follow the instructions provided for recording of symptoms and use of acid suppressive medications for the Bravo pH monitor (First 48 hours OFF all acid suppressive therapy, after this start PPI twice daily, 30-60 minutes prior to meals)  Await biopsy results.  Avoid NSAIDs (ibuprofen/advil, aspirin, naproxen/aleeve). Tylenol okay.  Follow-up in GI clinic in 3-4 weeks.            This electronic signature authenticates  all electronic and/or handwritten documentation, including orders, generated by the signer during the episode of care contained in this record.  05/18/2019 01:40:35 PM By Earnestine Leys

## 2019-05-18 NOTE — Procedures
A 33 year old female came today for a 96 hr Bravo placement 48 off / on medication through EGD.      Patient has been experiencing the following symptoms: Food sticking, belching ,and throat clearing.  Patient does not take  antiacid medication.     Patient verbalized understanding of the [place] study including understanding of placement, recording device, care instructions for the recording device, and all discharge instructions re: diary-keeping & when to return diary & recorder.  The Bravo recorder used SN # [id number].  The Bravo used was ID # [id number], Lot # [id number], and expires 07/17/2020.  The Bravo was a calibration free model bravo.     Dr. Malvin Johns performed an EGD and the z-line was noted to be at 38 cm.  The Bravo was then placed by provider to a depth of 32 cm from incisors.  Suction to 80 mm of Hg was applied for 60 seconds.  The location of the Bravo capsule was verified by reintroduction of EGD scope.  Start time for the Bravo pH study was 1328 and starting pH was 5.8.     Patient tolerated procedure well.

## 2019-05-21 ENCOUNTER — Inpatient Hospital Stay: Payer: BLUE CROSS/BLUE SHIELD

## 2019-05-21 ENCOUNTER — Institutional Professional Consult (permissible substitution): Payer: BLUE CROSS/BLUE SHIELD

## 2019-05-21 DIAGNOSIS — Z1239 Encounter for other screening for malignant neoplasm of breast: Secondary | ICD-10-CM

## 2019-05-21 DIAGNOSIS — B952 Enterococcus as the cause of diseases classified elsewhere: Secondary | ICD-10-CM

## 2019-05-21 DIAGNOSIS — N390 Urinary tract infection, site not specified: Secondary | ICD-10-CM

## 2019-05-21 DIAGNOSIS — Z01812 Encounter for preprocedural laboratory examination: Secondary | ICD-10-CM

## 2019-05-22 ENCOUNTER — Telehealth: Payer: BLUE CROSS/BLUE SHIELD

## 2019-05-22 DIAGNOSIS — N63 Unspecified lump in unspecified breast: Secondary | ICD-10-CM

## 2019-05-22 LAB — UA,Microscopic: RBCS HPF: 1 {cells}/[HPF] (ref 0–2)

## 2019-05-23 ENCOUNTER — Ambulatory Visit: Payer: BLUE CROSS/BLUE SHIELD

## 2019-05-23 ENCOUNTER — Telehealth: Payer: BLUE CROSS/BLUE SHIELD

## 2019-05-23 LAB — Bacterial Culture Urine: BACTERIAL CULTURE URINE: NO GROWTH

## 2019-05-23 LAB — Tissue Exam

## 2019-05-23 NOTE — OR Nursing
Bravo recorder returned with written documentation of Omeprazole doses. Data uploaded. Dr Malvin Johns notified.

## 2019-05-23 NOTE — Progress Notes
Brief telephone call:    I discussed the results of the mammogram and ultrasound of the breast.  There is a small breast mass in the right breast in the upper outer quadrant.  BI-RADS 3. I have ordered her for repeat breast ultrasound in 6 months as recommended.  We discussed the benign appearing nature of the lesion, but there is no role for biopsy at this point and that the best next step is to repeat imaging.  She expressed some a concern, but understanding.  She will call to schedule.    Discussed the normal urine microscopy and urine culture is pending. Fu results.    The patient agrees with this plan of care. The patient was given the opportunity to ask questions regarding the plan of care, which were answered during the call.

## 2019-05-28 ENCOUNTER — Telehealth: Payer: BLUE CROSS/BLUE SHIELD

## 2019-05-28 NOTE — Telephone Encounter
Orders Request    What is being requested? (Tests, Labs, Imaging, etc.): Covid test    Reason for the request: Patient is requesting to schedule covid test 24-48 hrs prior to the scheduled procedure on  06/04/2019 for Esophageal Manometry. Needs an order to schedule. Please place an order in care connect. Thank you.    Where does the patient want to be seen?       If outside [facility], what is the fax number to the facility?      Has the patient seen their doctor for this matter? Yes    Last office visit: 11/18    Patient was offered an appointment but declined.    Patient has been notified of the 24-48 hour turnaround time.

## 2019-05-29 ENCOUNTER — Ambulatory Visit: Payer: BLUE CROSS/BLUE SHIELD

## 2019-05-30 ENCOUNTER — Telehealth: Payer: BLUE CROSS/BLUE SHIELD

## 2019-05-30 ENCOUNTER — Ambulatory Visit: Payer: BLUE CROSS/BLUE SHIELD

## 2019-05-30 DIAGNOSIS — Z01812 Encounter for preprocedural laboratory examination: Secondary | ICD-10-CM

## 2019-05-30 DIAGNOSIS — Z01818 Encounter for other preprocedural examination: Secondary | ICD-10-CM

## 2019-05-30 NOTE — Telephone Encounter
MOTILITY CONFIRMATIONS    TEST: ESOPHAGEAL MANOMETRY E/FOOD CHALLENGE   DOS: 06/04/2019 at 11:00 am-Confirmed      [x]  CALLED PATIENT TWO DAYS PRIOR, DATE: 05/23/19  [x]  FEMALE NURSE OK  [x]  ORDER/REFERRAL ATTACHED TO MOTILITY APPOINTMENT & REFERRING MD INDICATED  [x]  VERIFY MOTILITY TEST SCHEDULED MATCH TEST(S) ORDERED  [x]  VERIFY PATIENT HAS BEEN FINANCIALLY CLEARED  [x]  VERIFY COVID TEST SCHEDULED/PENDED/PATIENT CLEARED    [x]  REVIEWED/VERIFIED  PATIENT HAS INSTRUCTIONS    []  24 pH - if OFF acid suppression, must be off medications as instructed by ordering physician and NPO after midnight   [x]  Esophageal Manometry - NPO after midnight   []  ADULT - Anorectal Manometry (2 saline fleet enemas at least 2 hrs prior to procedure; may have a light breakfast)  []  PEDS - For children under 24 years old - Administer (1) 4.5 oz Fleet mineral oil enema the night before the appointment and (1) mineral oil enema early in the morning on the day of the appointment  []  PEDS - For children 10 years or older  Administer (1) SALINE enema the night before and 1 SALINE enema the morning of the appointment    [x] Appointment notes entered (TEST/COVID CLEAR/COVID QV/INSTR REVIEWED/EMAILED/FINANCIAL CEARANCE

## 2019-05-30 NOTE — Telephone Encounter
Orders Request    What is being requested? (Tests, Labs, Imaging, etc.): COVID test    Reason for the request: Patient is calling back to schedule prior to her 12/7 procedure however an order is needed prior to scheduling. Please place order and advise patient when she may schedule. Thank you.    Where does the patient want to be seen?  [place]     If outside [place], what is the fax number to the facility?      Has the patient seen their doctor for this matter? Yes    Last office visit: 11/18    Patient was offered an appointment but declined.    Patient has been notified of the 24-48 hour turnaround time.

## 2019-05-31 ENCOUNTER — Telehealth: Payer: BLUE CROSS/BLUE SHIELD

## 2019-05-31 NOTE — Telephone Encounter
Results Request - The patient would like to discuss the results of their recent tests.     1) Ordering MD: Malvin Johns    2) What type of test(s)? Endoscopy & Bravo    3) When was it performed? 05/18/19    4) Where was it performed? [place]    If [place], are results available in CareConnect?   If outside facility, what is their phone number?     Patient has been notified of the 24-48 hour turnaround time.

## 2019-05-31 NOTE — Procedures
Bravo 96-hour Intraesophageal pH Study  Endoscopic Placement   [facility]    Indications/Symptoms: belching, globus/throat clearing  Medications: (Not in an outpatient encounter)    Referring physician: Resa Miner., MD  [address]  [address]  [place],  [place] [address]  Nurse/Technician Chalmers Cater, RN    Date of Procedure: 05/22/19    Procedure:  The Bravo wireless pH sensor was placed at the time of endoscopy.  The patient was asked to keep a 96-hour diary indicating the times eating started and stopped, when symptoms occurred, when medications were taken and when upright or supine.  The patient was instructed to go about life as usual.    The study was done off acid suppressing medications for 48-hours and on acid suppressing medications for the next 48-hours.    Results:  Day 1: Off medications   Total Upright Supine   Duration (hh.mm) 00:04 00:04 00:00   # of refluxes 7 7 0   % time pH <4.0 0.3 (nl < 5.3) 0.6 (nl < 6.9) 0.0 (nl < 6.7)   DeMeester Score 1.7 (Normal <14.72)    Day 2: Off medications   Total Upright Supine   Duration (hh.mm) 00:03 00:03 00:00   # of refluxes 10 10 0   % time pH <4.0 0.3 (nl < 5.3) 0.5 (nl < 6.9) 0.0 (nl < 6.7)   DeMeester Score 1.7 (Normal <14.72)    Day 3: On Medications   Total Upright Supine   Duration (hh.mm) 00:05 00:05 00:00   # of refluxes 13 13 0   % time pH <4.0 0.4 (nl < 5.3) 0.6 (nl < 6.9) 0.0 (nl < 6.7)   DeMeester Score 2.1 (Normal <14.72)      Day4: On medications   Total Upright Supine   Duration (hh.mm) 00:00 00:00 n/a   # of refluxes 0 0 n/a   % time pH <4.0 0.0 (nl < 5.3) 0.0 (nl < 6.9) n/a (nl < 6.7)   DeMeester Score 0.3 (Normal <14.72)  (Pandolfino JE, Am J Gastroenterol. 2003; 98: 740-749)     Belch: SAP 100, SI 5.2  Throat clearing: SAP 99.9, SI 9.5    Diagnosis:   - Negative for pathologic acid reflux. Not consistent with GERD - Good symptom correlation with limited acid reflux and belching and throat clearing per SAP. Although very few overall reflux events, thus majority of belching/throat clearing episodes not c/w acid reflux.  - c/w functional heartburn possible component of hypersensitivity    Discussion/Recommendations:   - Consider TCA/SSRI  - can retrial PPI or H2 blocker PRN for hypersensitivity  - Follow-up in GI clinic

## 2019-06-02 ENCOUNTER — Institutional Professional Consult (permissible substitution): Payer: BLUE CROSS/BLUE SHIELD

## 2019-06-02 DIAGNOSIS — Z01812 Encounter for preprocedural laboratory examination: Secondary | ICD-10-CM

## 2019-06-03 LAB — COVID-19 and Influenza A/B PCR: COVID-19 PCR: NOT DETECTED

## 2019-06-04 ENCOUNTER — Ambulatory Visit: Payer: BLUE CROSS/BLUE SHIELD | Attending: Gastroenterology

## 2019-06-04 DIAGNOSIS — R0989 Other specified symptoms and signs involving the circulatory and respiratory systems: Secondary | ICD-10-CM

## 2019-06-04 NOTE — Nursing Note
Patient: Louann Huebert  SEX: female  MRN: [id number]  DOB: 13-Apr-1986  DATE OF SERVICE: 06/04/2019  PROCEDURE: ESOPHAGEAL MANOMETRY  REFERRING MD: Neta Mends., MD*  PRIMARY CARE PROVIDER: Fortino Sic., MD    Equipment Catheter Used [place] [phone]    Reason for Exam: Globus sensation  C/O:      Assessed patient preparation for esophageal manometry: Patient npo since midnight.    Educated patient regarding catheter placement, discomfort associated with that placement and manometry protocol. A 36 channel, solid state, high resolution, esophageal manometry catheter with impedence ([employer]). Patient verbalized understanding.    Catheter placement via the left nares to a depth of 51 cm allowed for visualization of the upper esophageal sphincter (UES) and lower esophageal sphincter (LES)  pressure interpretations throughout the entire monitor frame.     Next the catheter was secured to the nose with paper tape and the patient was placed in the semi recumbent position with the HOB at 30 degree incline.   First a 30-second resting baseline measurement of the UES and LES was obtained.     Esophageal manometry protocol included:    10 - 5 ml swallows of normal saline in 20 second intervals per swallow  10 - 5 ml swallows of viscous gel in 20 second intervals per swallow  Patient HOB placed upright to 90 degrees  5 - 5 ml swallows of normal saline in 20 second intervals  5 - bites of saltine cracker in 20 second intervals per swallow.  Catheter repositioned to 43 cm  5 - 5 ml swallows of normal saline in 5 - 7 second intervals per swallow (to view UES in greater detail)    Once complete the catheter was carefully removed.  Patient tolerated study without complication.

## 2019-06-08 NOTE — Telephone Encounter
covid test was submitted done by MD.

## 2019-06-09 NOTE — Procedures
High-Resolution Esophageal Impedance Manometry with Viscous and Solid Swallows   [facility]  [address]  [place], [place] [address]    Indications/Symptoms: globus sensation, belching  Medications:   Outpatient Medications Prior to Visit   Medication Sig   ? norgestimate-ethinyl estradiol (ORTHO TRI-CYCLEN, TRI SPRINTEC) 0.18/0.215/0.25 mg-35 mcg tablet TAKE 1 TABLET BY MOUTH EVERY DAY   ? sucralfate (CARAFATE) 1 g/10 mL suspension Take 10 mLs (1 g total) by mouth four (4) times daily.     No facility-administered medications prior to visit.      Referring physician: Fransisco Beau MD, MPH  [address]  [address] [place] [address],  [place] [address]  Nurse/Technician Sherrlyn Hock, RN    Date of Procedure: 06/04/19    Manometry Protocol: The patient came to the GI Motility Procedure Unit after a fast of at least 6 hours and the procedure was explained.  After determining that there is no allergy to lidocaine, local anesthesia was provided by applying 2% viscous lidocaine into the nares.  After adequate anesthesia, a 36-channel, solid-state, high-resolution manometry catheter was introduced via the nares and advanced until upper esophageal sphincter (UES) and lower esophageal sphincter (LES) pressures were identified simultaneously in the recording frame.  After proper positioning of the catheter, the patient was placed supine, and allowed to accommodate to the catheter.  The manometry protocol started with a 30 second resting period during which no swallows occurred to determine resting UES and LES pressures.   Esophageal motor function was evaluated with a minimum of ten 5cc liquid swallows administered at 20 to 30 second intervals. This was followed by ten 5cc standardized viscous swallows [employer] at 20 to 30 second intervals. Patient was then placed upright and given five 5cc liquid swallows administered at 20 to 30 second intervals. This was followed by five swallows of crackers administered at 20 to 30 second intervals. At completion of the study, the catheter was removed and the patient was discharged.    Analysis was carried out according to The Chicago Classification, v3.0.         (Neurogastroenterol Motil 2015;27:160-174)  Impedance values according to Berna Bue, et al.        (Clin Gastroenterol Hepatol 2003; 1: 174?182)    Results:  LES location 44 cm from nares  Pressure inversion point (diaphragmatic crura) 44.5 cm from nares  Hiatal Hernia: None    Upper Esophageal Sphincter (UES) Function                                                                        Patient Normal   Resting UES Pressure 50.2 mmHg 34-104 mmHg   Mean Residual UES Pressure 0.7 mmHg <12 mmHg Lower Esophageal Sphincter (LES) Function   Normal   Resting LES Pressure 70.3 mmHg 4.8-32 mmHg   Integrated LES Relaxation Pressure (Median) 14.8 mmHg <15.0 mmHg       Esophageal Motor Function   Normal   Distal Contractile Integral (DCI) (mmHg-cm-sec) 450 - 8,000 mmHg-cm-sec      DCI > 8,000       (Hypercontractile) 40 % <20%       DCI <8,000, >450     (  Normal) 60 %       DCI < 450, >100                (Ineffective) 0 % <50%       DCI < 100                    (Failed) 0 %    Distal Latency (DL) >4.4 sec        IH<4.7 sec 0 % <42%   Panesophageal Pressurization (>56mmHg) 0 % <20%   Fragmented Peristalsis (Gap >5 cm and DCI > 450 mmHg-s-cm) 0 % <50%     Clearance of Liquid Bolus 100% >80%   Clearance of Viscous Bolus 100% >70%   Clearance of Solid Bolus 100% N/A     Supine saline swallows (hypercontractile swallows)      Upright solid swallows (when patient reported food sticking)        Diagnosis:  1. 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  The hypercontractile segment extends to 12.2cm above the top of LES (from 30.4cm to top of LES at 42.6cm). But no hypercontractile persistalsis was seen when esophagus was challenged with viscous and solid boluses, which is typically seen with Jackhammer esophagus.   2. Elevated resting LES pressure.   3. Normal pharyngeal motor function.   4. No belching seen during study. When patient reported sensation of food sticking when swallowing solids, this was associated with normal esophageal peristalsis with complete bolus clearance on impedance.     Discussion/Recommendations:   1. Clinically correlate.   2. Follow-up with referring provider.       Karmen Stabs, MD, MS  06/09/2019 2:29 PM

## 2019-06-20 ENCOUNTER — Telehealth: Payer: BLUE CROSS/BLUE SHIELD | Attending: Gastroenterology

## 2019-06-20 MED ORDER — FAMOTIDINE 40 MG PO TABS
40 mg | ORAL_TABLET | Freq: Every day | ORAL | 5 refills | Status: AC | PRN
Start: 2019-06-20 — End: ?

## 2019-06-20 NOTE — Progress Notes
[facility]  [address], [address]  [place] [address]  Phone: [phone]  Fax: [phone]      REFERRING PRACTITIONER: No ref. provider found  PRIMARY CARE PROVIDER: Resa Miner., MD    Chief Complaint   Patient presents with   ? Follow-up   gerd, globus    Reason for consult/follow-up: gerd, globus, belching    Endoscopy since last visit:   BRAVO: Diagnosis:   - Negative for pathologic acid reflux. Not consistent with GERD  - Good symptom correlation with limited acid reflux and belching and throat clearing per SAP. Although very few overall reflux events, thus majority of belching/throat clearing episodes not c/w acid reflux.  - c/w functional heartburn possible component of hypersensitivity  ?  Discussion/Recommendations:   - Consider TCA/SSRI  - can retrial PPI or H2 blocker PRN for hypersensitivity  - Follow-up in GI clinic    Diagnosis:  1. 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  The hypercontractile segment extends to 12.2cm above the top of LES (from 30.4cm to top of LES at 42.6cm). But no hypercontractile persistalsis was seen when esophagus was challenged with viscous and solid boluses, which is typically seen with Jackhammer esophagus.   2. Elevated resting LES pressure.   3. Normal pharyngeal motor function.   4. No belching seen during study. When patient reported sensation of food sticking when swallowing solids, this was associated with normal esophageal peristalsis with complete bolus clearance on impedance.     INTERVAL PROGRESS:  TODAY:  Was exercising a lot and eating well, with improvement in symptoms.        Still with feeling of lump in her throat and feeling of fullness about 2-3 hours later and belching.  Not on any antiacid medications for the past 2 years, but has made diet changes with some improvement in symptoms. Pending egd with bravo 96 hour off/on ppi and manometry.    Past Medical History:   Diagnosis Date   ? Chicken pox    ? GERD (gastroesophageal reflux disease)    ? 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 Seasonal allergies        MEDICATIONS:  Medications that the patient states to be currently taking   Medication Sig   ? norgestimate-ethinyl estradiol (ORTHO TRI-CYCLEN, TRI SPRINTEC) 0.18/0.215/0.25 mg-35 mcg tablet TAKE 1 TABLET BY MOUTH EVERY DAY     Current Facility-Administered Medications for the 06/20/19 encounter (Telemedicine) with Fransisco Beau., MD, MPH   Medication   ? lidocaine 2% jelly 2 mL       ALLERGIES:  No Known Allergies    Lab Review:  Lab Results   Component Value Date    WBC 8.54 04/28/2018    HGB 14.1 04/28/2018    HCT 43.5 04/28/2018    MCV 88.4 04/28/2018    PLT 291 04/28/2018     Lab Results   Component Value Date    AST 21 04/30/2019    ALT 12 04/30/2019    ALKPHOS 50 04/30/2019    BILITOT 0.3 04/30/2019    ALBUMIN 4.4 04/30/2019    TOTPRO 6.9 04/30/2019     Lab Results   Component Value Date    NA 143 04/30/2019    K 4.4 04/30/2019    CL 103 04/30/2019    CO2 28 04/30/2019    BUN 9  04/30/2019    CREAT 0.74 04/30/2019    CALCIUM 9.6 04/30/2019     No results found for: SRWEST, CRP  No results found for: INR, FE, FEBINDSAT, TIBC, FERRITIN, VITAMINB12, FOLATE  No results found for: SRWEST, CRP, GLIADINIGA, GLIADINIGG, ENDOMAB, TRNGLUTIGAAB, IGASER  No results found for: HELPYLAB, HELPYLAGSTL     PROCEDURES:  Results for orders placed or performed during the hospital encounter of 05/18/19   UPPER GASTROINTESTINAL ENDOSCOPY    Transcription    PATIENT NAME:       [name], [name]  DATE OF BIRTH:       [birth year]  RECORD NUMBER:      [id number]  DATE/TIME OF PROCEDURE:     05/18/2019 / 01:00:00 PM  ENDOSCOPIST:       Earnestine Leys, MD  REFERRING PHYSICIAN:        FELLOW: INDICATIONS FOR EXAMINATION:      GERD, globus, dysphagia. plan for bravo 96 hour off/on ppi.               PROCEDURE PERFORMED:             UPPER GI ENDOSCOPY    MEDICATIONS:    MAC anesthesia    PROCEDURE TECHNIQUE:  Informed consent obtained for the procedure including risks, benefits and alternatives and the risk of those alternatives. Informed consent obtained for sedation including risks, benefits and alternatives and the risk of those alternatives.    EKG, pulse, pulse oximetry and blood pressure were monitored throughout the procedure.     The endoscope was passed with ease through the mouth under direct visualization; it was extended to the 2nd portion of the duodenum. The scope was withdrawn and the mucosa was carefully examined. The views were excellent. The patient's toleration of the   procedure was excellent.    Measurements were taken with the Z-line at 38cm from the incisors. The Bravo system was prepared and appropriately marked (to be placed 6cm above Z-line). The catheter and device were passed into the mouth and esophagus without difficulty under   visualization with the endoscope. Suction was applied for 60 seconds then the capsule deployed and confirmed by endoscopy at 32cm from the incisors.    Total Withdrawl Time:   Total Insertion Time:                              EXTENT OF EXAM:  2nd part of the duodenum            INSTRUMENTS:   [id number] #[id number]  TECHNICAL DIFFICULTY:  No   LIMITATIONS:      None  TOLERANCE:   Good  VISUALIZATION:  Good    FINDINGS:   Esophagus: No esophagitis. No hiatal hernia. Pinch/EGF/Zline at 38cm. Biopsies taken in distal and proximal esophagus.  Bravo device deployed and attachment to esophageal wall confirmed endoscopically.  Stomach: Mild antral gastropathy. One small antral erosion. No ulcers. Biopsies taken from antrum and body. The pylorus was patent. On retroflexion no additional findings were noted. Duodenum: Normal mucosa in bulb and 2nd portion. Biopsies taken in 2nd portion.    ESTIMATED BLOOD LOSS:   None     DIAGNOSIS:  No esophagitis. No hiatal hernia. Pinch/EGF/Zline at 38cm. Biopsies taken in distal and proximal esophagus.  Mild antral gastropathy. One small antral erosion. No ulcers. Biopsies taken.  Normal duodenum. Biopsies taken in 2nd portion.   BRAVO capsule placed successfully.    RECOMMENDATIONS:  Follow the instructions provided for recording of symptoms and use of acid suppressive medications for the Bravo pH monitor (First 48 hours OFF all acid suppressive therapy, after this start PPI twice daily, 30-60 minutes prior to meals)  Await biopsy results.  Avoid NSAIDs (ibuprofen/advil, aspirin, naproxen/aleeve). Tylenol okay.  Follow-up in GI clinic in 3-4 weeks.            This electronic signature authenticates all electronic and/or handwritten documentation, including orders, generated by the signer during the episode of care contained in this record.  05/18/2019 01:40:35 PM By Earnestine Leys               IMPRESSION / PLAN:  33 y.o.female  No diagnosis found.Ongoing globus sensation, intermittent difficulty with swallowing. Eval for GERD with egd/bravo on/off ppi. Also bx for H.pylori and r/o celiac. Pending esophageal manometry. Discussed procedures in detail. All questions answered.        Plan:  There are no Patient Instructions on file for this visit.    No orders of the defined types were placed in this encounter.       The above plan of care, diagnosis, orders, and follow-up were discussed with the patient.  Questions related to this recommended plan of care were answered.    More than 50% of the 21-minute encounter time was spent with counseling and in the coordination of care for the contents and topics discussed above.       Thank you for this consult.  Please contact me if you have any questions.    Fransisco Beau, MD, MPH  [employer] Department of Medicine, Gastroenterology  [facility] Division of Digestive Diseases  [facility]

## 2019-06-20 NOTE — Patient Instructions
-   peppermint (2 altoids or peppermint candy) if feeling of trouble swallowing.  - famotidine 40mg  daily  - Let me know if any worsening symptoms

## 2019-11-19 ENCOUNTER — Ambulatory Visit: Payer: BLUE CROSS/BLUE SHIELD

## 2019-11-20 ENCOUNTER — Ambulatory Visit: Payer: BLUE CROSS/BLUE SHIELD

## 2019-11-22 NOTE — Telephone Encounter
Calling radiology office now ph#[phone]

## 2019-11-22 NOTE — Telephone Encounter
Spoke with latasha at radiology office and she stated there is a cancellation for today at [facility] office at 3:00pm. Called and spoke with pt but she advised she is out of town and is due for apt after 6/15 . Per latasha the only other apt sooner than the one schedule for 8/12 would be on 8/11.  Recommended that pt call the radiology office to check for sooner apt but also pt is on wait list for cancellations.

## 2019-11-23 ENCOUNTER — Ambulatory Visit: Payer: BLUE CROSS/BLUE SHIELD

## 2019-11-23 NOTE — Telephone Encounter
Hi Dr.Hsu,  Kindly advise on the below. The referral has already expired.  Thank you,

## 2019-12-04 ENCOUNTER — Ambulatory Visit: Payer: BLUE CROSS/BLUE SHIELD

## 2019-12-12 NOTE — Telephone Encounter
I was on hold for 2 hours and no answer from Insurance. Will continue to call

## 2019-12-13 ENCOUNTER — Ambulatory Visit: Payer: BLUE CROSS/BLUE SHIELD

## 2019-12-13 NOTE — Telephone Encounter
Attempted to contact insurance and still no answer. I sent patient mychart message with codes.

## 2019-12-27 ENCOUNTER — Ambulatory Visit: Payer: BLUE CROSS/BLUE SHIELD

## 2019-12-28 ENCOUNTER — Ambulatory Visit: Payer: BLUE CROSS/BLUE SHIELD

## 2019-12-28 ENCOUNTER — Telehealth: Payer: BLUE CROSS/BLUE SHIELD

## 2019-12-28 DIAGNOSIS — Z30019 Encounter for initial prescription of contraceptives, unspecified: Secondary | ICD-10-CM

## 2019-12-28 MED ORDER — NORGESTIM-ETH ESTRAD TRIPHASIC 0.18/0.215/0.25 MG-35 MCG PO TABS
1 | ORAL_TABLET | Freq: Every day | ORAL | 2 refills | 84.00000 days | Status: AC
Start: 2019-12-28 — End: ?

## 2019-12-28 NOTE — Telephone Encounter
Spoke with pt ,she will call back to schedule a follow up with dr Tenna Delaine for back pain.    pcc if pt calls back please assist with scheduling.

## 2020-01-04 ENCOUNTER — Ambulatory Visit: Payer: BLUE CROSS/BLUE SHIELD

## 2020-01-04 DIAGNOSIS — M5116 Intervertebral disc disorders with radiculopathy, lumbar region: Secondary | ICD-10-CM

## 2020-01-04 DIAGNOSIS — Z719 Counseling, unspecified: Secondary | ICD-10-CM

## 2020-01-04 MED ORDER — IBUPROFEN 600 MG PO TABS
600 mg | ORAL_TABLET | Freq: Three times a day (TID) | ORAL | 0 refills | Status: AC
Start: 2020-01-04 — End: ?

## 2020-01-04 NOTE — Assessment & Plan Note
Now worsening, with spasm and sciatica.  NSAIDs, flexeril prn, heat, lidocaine patch prn.  AAOS Exercises given  PT referral.

## 2020-01-04 NOTE — Patient Instructions
Doe spine conditioning exercises at least twice a day.  Ibuprofen every 8 hours for the next 14 days then as needed.  Cyclobenazperine as often as every 8 hours as needed. Do NOT take before driving. Do NOT take with alcohol or other sedating things/medications.  Rest, don't do anything that makes the pain worse.  Heating or heat packs 2-3 times per day for stiffness.  Salonpas lidocaine patch or capsicin patches if tolerated.
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
## 2020-01-05 ENCOUNTER — Inpatient Hospital Stay: Payer: BLUE CROSS/BLUE SHIELD

## 2020-01-08 ENCOUNTER — Telehealth: Payer: BLUE CROSS/BLUE SHIELD

## 2020-01-08 ENCOUNTER — Ambulatory Visit: Payer: BLUE CROSS/BLUE SHIELD

## 2020-01-08 NOTE — Telephone Encounter
PT referral faxed to [facility] center fax# [phone]

## 2020-01-08 NOTE — Telephone Encounter
Call Back Request      Reason for call back: per pt would like a call back from [facility] around 2:30-3:30 in regards to her Mychart message.  Any Symptoms:  []  Yes  [x]  No      ? If yes, what symptoms are you experiencing:    o Duration of symptoms (how long):    o Have you taken medication for symptoms (OTC or Rx):      Patient or caller has been notified of the 24-48 hour turnaround time.

## 2020-01-08 NOTE — Telephone Encounter
PDL Call to Practice    Reason for Call:pt returning Yesenia's call regarding setting appt with MD. Per pt she hasn't seen this MD before    Appointment Related?  [x]  Yes  []  No     If yes;  Date:TBD  Time:    Call warm transferred to PDL: [x]  Yes  []  No    Call Received by Practice Representative:Daisy

## 2020-01-08 NOTE — Telephone Encounter
Forwarded by: [place]er [place] JAMILET Charisma Charlot

## 2020-01-08 NOTE — Telephone Encounter
Left a detailed msg and mychart msg     Following up on msg: regards to her Physical Therapy referral. She wants to use it at an outside [facility] facility which is [facility] [phone] [facility], [place] [address]. She also has a question in regards to her Sleep Study referral.

## 2020-01-08 NOTE — Telephone Encounter
Reply by: Juliette Mangle    Left detailed voice msg to check mychart msg reply

## 2020-01-08 NOTE — Telephone Encounter
Pt would like a status on this issue. Please follow up with patient.    Thank you

## 2020-01-09 ENCOUNTER — Ambulatory Visit: Payer: BLUE CROSS/BLUE SHIELD

## 2020-01-09 DIAGNOSIS — R0681 Apnea, not elsewhere classified: Secondary | ICD-10-CM

## 2020-01-09 NOTE — Telephone Encounter
Disregard msg below

## 2020-01-09 NOTE — Telephone Encounter
Spoke with patient and she asked to please change sleep study to in lab sleep study so she can schedule meanwhile it gets approved by insurance.

## 2020-01-09 NOTE — Telephone Encounter
mychart msg sent to pt to address issue with appointment

## 2020-01-09 NOTE — Telephone Encounter
Please call her and let her know that an in-lab sleep study has been ordered.  Reply by: Deliah Goody. Rigoberto Noel

## 2020-01-10 NOTE — Telephone Encounter
Spoke with patient on 7/13

## 2020-01-10 NOTE — Telephone Encounter
LVM for patient with phone number to schedule

## 2020-01-21 ENCOUNTER — Ambulatory Visit: Payer: BLUE CROSS/BLUE SHIELD

## 2020-01-22 ENCOUNTER — Telehealth: Payer: BLUE CROSS/BLUE SHIELD

## 2020-01-22 NOTE — Telephone Encounter
F/UP-07/23/2020 MA 7/27 8:53AM

## 2020-01-23 ENCOUNTER — Ambulatory Visit: Payer: BLUE CROSS/BLUE SHIELD

## 2020-01-23 NOTE — Telephone Encounter
Hello,    Patient has been calling to get scheduled and has not received a call back. Can someone kindly call her to schedule.     Thank you,

## 2020-01-24 ENCOUNTER — Ambulatory Visit: Payer: BLUE CROSS/BLUE SHIELD

## 2020-02-07 ENCOUNTER — Ambulatory Visit: Payer: BLUE CROSS/BLUE SHIELD

## 2020-02-27 ENCOUNTER — Ambulatory Visit: Payer: BLUE CROSS/BLUE SHIELD

## 2020-03-04 ENCOUNTER — Ambulatory Visit: Payer: BLUE CROSS/BLUE SHIELD

## 2020-03-04 DIAGNOSIS — Z01812 Encounter for preprocedural laboratory examination: Secondary | ICD-10-CM

## 2020-03-17 ENCOUNTER — Institutional Professional Consult (permissible substitution): Payer: BLUE CROSS/BLUE SHIELD

## 2020-03-17 ENCOUNTER — Telehealth: Payer: BLUE CROSS/BLUE SHIELD

## 2020-03-17 DIAGNOSIS — Z01812 Encounter for preprocedural laboratory examination: Secondary | ICD-10-CM

## 2020-03-17 NOTE — Telephone Encounter
Good Morning/Afternoon,    Please be advised patient is NOT cleared for appt on 09.22.2021 @ 8:30PM.  Messages have been left for patient to call back with new inurance info or advise if visit will be self pay.    Thank you  Beliunda C.  Financial Clearance Unit   [phone]

## 2020-03-18 ENCOUNTER — Ambulatory Visit: Payer: BLUE CROSS/BLUE SHIELD

## 2020-03-18 LAB — COVID-19 PCR

## 2020-03-18 NOTE — Telephone Encounter
Reply by: Renee Ramus [facility],      Thank you for the information I have reached out to patient via Mychart. I will wait at least 24 hours for a response to see if she has a new insurance or will be self-pay before I cancel the appointment. Thank you.     Best regards,   Joseph Art.

## 2020-03-19 ENCOUNTER — Ambulatory Visit: Payer: BLUE CROSS/BLUE SHIELD | Attending: Neurology

## 2020-03-19 DIAGNOSIS — R0681 Apnea, not elsewhere classified: Secondary | ICD-10-CM

## 2020-03-20 NOTE — Patient Instructions
03/20/2020    Dear Ms. Rollings    We hope that your stay at our facility has been comfortable and pleasant. We always welcome feedback and strive to provide the best possible care for our patients.  Typically, your sleep study will be interpreted within two (2) weeks of your stay with [place].  It is important that you make arrangements for follow up clinical care after your study with the physician who ordered your sleep study; please call your physician two (2) weeks after your study to review the results. Your options for treatment will be explored at that time, but you will not receive a call from our office.      If you are placed on PAP, you and your physician may also want you to proceed with PAP treatment and follow-up in sleep clinic with a sleep medicine specialist,'' then you should arrange to have a follow-up (usually bringing your equipment with you) within 31-91 days after receiving your equipment at home to ensure continued insurance coverage. At the time of your appointment, your physician will assess your progress with the prescribed treatment.  If you wish to follow up with your physician prior to starting in-home treatment, please make the necessary arrangements.  If you are to have a surgical procedure, or receive an oral appliance, please arrange for follow up care with your surgeon, or dentist.    Sleep Study Reports are sent to your referring physician(s) only. If you wish to have a copy of your report, you kindly call the Medical Records Department at [phone] or obtain a copy from your doctor. The [facility] staff cannot give any results over the phone or provide copies of reports.    Sincerely,    [facility] Staff

## 2020-03-27 ENCOUNTER — Telehealth: Payer: BLUE CROSS/BLUE SHIELD

## 2020-03-27 ENCOUNTER — Ambulatory Visit: Payer: BLUE CROSS/BLUE SHIELD

## 2020-03-27 NOTE — Telephone Encounter
Appointment Accommodation Request      Appointment Type: Return    Reason for sooner request: Housestaff needs skin check prior to November as she will be loosing her insurance in November, please reach out via mychart.    Date/Time Requested (If any): anytime in October    Last seen by MD:     Any Symptoms:  []  Yes  [x]  No      ? If yes, what symptoms are you experiencing:   o Duration of symptoms (how long):     Patient or caller was offered an appointment but declined.    Patient or caller was advised to seek emergency services if conditions are urgent or emergent.    Patient has been notified of the 24-48 hour turnaround time.

## 2020-03-27 NOTE — Telephone Encounter
Sent mychart message to schedule with Dr.Shahabi on 04/22/20

## 2020-03-28 NOTE — Procedures
***PRELIMINARY REPORT  UNSIGNED PRELIMINARY REPORT MAY NOT BE USED TO DIRECT PATIENT CARE     The lowest SaO2 was 90% @ epoch508***     [facility]  SLEEP STUDY REPORT      Patient Name: [name] [name],[name] [name] Height: 63 in   Date of Birth: [birth year] Weight: 122 lb   Sex: female BMI: 22 kg/m2   [age] Type of Study: PSG   ZOXW#[id number] Referring Physician: Donnamarie Rossetti   CSN: [id number]  Technologist: Nicoletta Ba   ESS: 4/24??? PHQ-9 Score: 5/27???   Test Date: 03/19/2020    ??? Epworth Sleepiness Scale (ESS): A score of ? 10 signifies excessive sleepiness.  ??? Patient Health Questionnaire-9 (PHQ-9) Score: Normal less than 5.     Main complaint: Possible sleep apnea.  Medications: has a current medication list which includes the following prescription(s): famotidine, norgestimate-ethinyl estradiol, and sucralfate, and the following Facility-Administered Medications: lidocaine.     An overnight sleep study was performed on 03/19/2020. A Summary and detailed report of the sleep study is provided. The interpreting physician has reviewed the referral, clinical history, medical notes, patients sleep questionnaire, sleep technologist's notes, and raw data epoch by epoch.     Impression:     1. The study demonstrates {severity:14795} obstructive sleep apnea with AHI of 1/hr, worse in supine position and associated with oxygen desaturation down to 90.0% from baseline of 97.0%.  2. The patient slept 6.1 hours out of a total 6.5 hours bedtime monitored, yielding {Blank single:19197::''normal'',''reduced'',''poor''} sleep efficiency of 94%. Latency to sleep onset was 12.0 minutes, which was {Blank single:19197::''short'',''prolonged'',''normal''}.  3. EKG demonstrated normal sinus rhythm   4. The EEG was normal.  5. The EMG was normal.     Diagnosis:      Axis I: {Common Sleep-related Diagnoses:28965}; {Common Sleep-related Diagnoses:28965}  Axis II: Nocturnal Polysomnogram baseline study, 95810. ***Split night protocol, allowing for both diagnosis and therapy with CPAP, was attempted, but the patient did not meet the diagnosis of OSA until the end of the study. Insufficient time was therefore available to begin therapy with CPAP.      Recommendation:     1)       Treatment with positive airway pressure (PAP, e.g. CPAP, Auto-PAP). A PAP titration polysomnogram may be required to determine optimal settings.  2)       Consider consultation with sleep medicine specialist to discuss test results and treatment options.  3)       Consider further evaluation and treatment for depression, PHQ-9: 5 (normal less than 5).  4)       Consider further adjustment to the oral appliance.  5)       Depending on clinical findings, intra-oral device for mandibular advancement may be an effective treatment option.  6)       Depending on clinical findings, surgery may be an effective treatment option.  7)       Patient should be advised to maintain sleep in the lateral positions until starting additional treatment.  8)       Consider further evaluation and treatment for underlying medical problems like relative ferritin deficiency, anemia, renal metabolic problems, peripheral vascular disease or neuropathy, etc., which may cause secondary PLMS.    9)    The patient should follow good sleep hygiene by avoiding sedatives, alcohol and/or major amounts of food or fluid before bedtime.   10)    Patient should try to maintain set bed and wake times.  If the patient does not meet sleep needs at night, afternoon naps should be taken to supplement.   11)    Weight loss may improve the degree of sleep disordered breathing.  12)    The patient should be advised not to drive when sleepy.      Recommendation:     1)       The patient should be advised not to drive when sleepy.   2)       Weight loss may improve the degree of sleep disordered breathing.  3)       Patient should be advised to maintain sleep in the lateral positions until starting additional treatment. 4)       The patient should follow good sleep hygiene by avoiding sedatives, alcohol and/or major amounts of food or fluid before bedtime.   5)       Consider further adjustment to the oral appliance.  6)       Consider consultation with sleep medicine specialist to discuss test results and treatment options.  7)       Depending on clinical findings, intra-oral device for mandibular advancement may be an effective treatment option.  8)       Depending on clinical findings, surgery may be an effective treatment option.  9)       Consider further evaluation and treatment for depression, PHQ-9: XX (normal less than 5).  10)    Consider further evaluation and treatment for underlying medical problems like relative ferritin deficiency, anemia, renal metabolic problems, peripheral vascular disease or neuropathy, etc., which may cause secondary PLMS.    11)    Patient should try to maintain set bed and wake times.  If the patient does not meet sleep needs at night, afternoon naps should be taken to supplement.       The patient was referred for sleep testing only. Patient should return to the referring physician for clinical interpretation of this sleep study and for discussion of recommendations, follow up and management, including the prescribing of CPAP if appropriate. For referring physicians who are receiving this report who wish to request sleep specialist assistance for interpretation or follow-up, please refer the patient for a formal sleep medicine clinical consultation or place an eConsult for Sleep Medicine order in [facility].  For referring physicians at [facility], please place an order for sleep consultation in [facility] or call us to schedule a formal sleep consult  [phone]. For referring physicians outside [facility], please call [phone] or [phone] to schedule the patient an appointment for sleep consultation.      Electronically signed by     Lucia Gaskins, MD  Diplomate, American Board of Sleep Medicine     _____________________________________________________________________________________________     The following data based on technologist observation - subject to physician interpretation    A. GENERAL PARAMETERS MEASURED      Left and Right EOG EKG   Chin EMG Anterior Tibialis EMG   Abdominal Movement Thoracic Chest Movement   Nasal and Buccal Airflow/PTAF Oximetry   EEG (F4-M1, C4-M1, and O2-M1) Snore Sound   Body Position       B. SLEEP SCORING DATA          1. Lights Out: 11:00:43 PM       2. Lights On: 05:30:16 AM       3. Total sleep time: 365 minutes (6.1 hrs)       4. Total recording time: 389  minutes (6.5 hrs)       5. Sleep efficiency index: 94%       6. Wake time: 24 minutes (Normal is equal or less than 20 minutes)            Stage N1: 14 minutes (4% of total sleep time. Normal: 5-15%)            Stage N2: 222 minutes (61% of total sleep time. Normal: 45-80%)             Stage N3: 33 minute(s) (9% of total sleep time. Normal: 0-15%)             Stage R: 96 minutes (26% of total sleep time. Normal: 12-20%)       7. Latency to sleep onset: 12.0 minutes (Normal 10-20 minutes)       8. Wake after sleep onset: 12.0 minutes (Normal is equal or less than 20 minutes)       9. Latency to REM onset: 62.5 minutes (Normal is equal or greater than 90 minutes)      C. AROUSAL EVENTS          1. Total number of arousals: 61        2. Arousal index: 10/hr (Normal is less than 10/hr)    D. CARDIAC SUMMARY     Heart Rate Summary     Average heart rate during sleep: 61.0 bpm. Highest during sleep: 89.0 bpm. Highest of awake: 92.0 bpm.    Cardiac Events:      Sinus Tachycardia None Bradycardia None   Atrial Fibrillation  None Asystole None   Narrow Complex Tachycardia None Wide Complex Tachycardia None      Please see impression section above for final arrhythmia classification    E. MOVEMENT EVENTS      LMs Index    PLMS  0 0/hr   PLMS with Arousal  0 0/hr      F. RAPID EYE MOVEMENT SLEEP WITHOUT ATONIA (RSWA/RWA)     RSWA was not noted. Dream enactment behavior was not noted.     G. RESPIRATORY EVENTS    Respiratory Rate: 17 breaths per minute.    The patient did not have Cheyne-Stokes breathing. During baseline study: Total obstructive apneas: 0. Total central apneas: 1. Total mixed apneas: 0. Total hypopneas: 5. Total number of apneas/hypopneas: 6. Total number of RERAs: 14.    Position Supine, Lateral   Sleep State  REM, NREM   Baseline SaO2 Level  97%   Supplemental O2 N/A LPM    Minimum O2 Saturation  90.0%    Recorded Sleep Time 6.1 Hours   Apnea-Hypopnea Index  (AHI) 1/hr    Apnea Index  (AI) 0.2/hr   Hypopnea Index  (HI) 0.8/hr    Respiratory Disturbance Index  (RDI) 3/hr   AHI on supine position 3/hr    AHI during REM sleep 3/hr   Apnea/Hypopneas Index (AHI): Number of apnea/hypopnea episodes per hour of sleep (Normal less than 5).  Hypopnea definition (AASM Rule 1B): A decrease in the nasal pressure signal amplitude of 30% or greater, lasting 10 or more seconds, with a 4%or greater oxygen desaturation from baseline.  Respiratory Disturbance Index (RDI): Number of apnea, hypopnea, and respiratory effort related arousal (RERA) episodes per hour of sleep.       H. OXIMETRY SUMMARY     Percent time and time in minutes for each oxygen saturation range during sleep:      Oxygen Saturation Range  Sleep Time in Minutes  Percent of Total Sleep Time    89%-100% 365 100.0%   81%-88% 0 0.0%   71%-80% 0 0.0%   61%-70% 0 0.0%   Artifact 0 0.0%

## 2020-03-31 NOTE — Progress Notes
Date: 03/19/20 (Procedures)  Sleep Lab: [place]  PSG  AHI 1/hr, RDI 3/hr  O2 nadir 90% from baseline 97%  Time O2 saturation<88%: 0 min  PLMI 0/hr with arousal index of 0/hr  TIB 6.5 hrs/TST 6.1 hrs

## 2020-04-08 ENCOUNTER — Telehealth: Payer: BLUE CROSS/BLUE SHIELD

## 2020-04-08 NOTE — Telephone Encounter
Left msg to confirm apt with dr puneky on 10/14

## 2020-04-10 ENCOUNTER — Ambulatory Visit: Payer: BLUE CROSS/BLUE SHIELD | Attending: Critical Care Medicine

## 2020-04-10 ENCOUNTER — Ambulatory Visit: Payer: BLUE CROSS/BLUE SHIELD

## 2020-04-10 DIAGNOSIS — Z30019 Encounter for initial prescription of contraceptives, unspecified: Secondary | ICD-10-CM

## 2020-04-10 DIAGNOSIS — R400 Somnolence: Secondary | ICD-10-CM

## 2020-04-10 DIAGNOSIS — Z23 Encounter for immunization: Secondary | ICD-10-CM

## 2020-04-10 DIAGNOSIS — Z0000 Encounter for general adult medical examination without abnormal findings: Secondary | ICD-10-CM

## 2020-04-10 DIAGNOSIS — Z201 Contact with and (suspected) exposure to tuberculosis: Secondary | ICD-10-CM

## 2020-04-10 MED ORDER — NORGESTIM-ETH ESTRAD TRIPHASIC 0.18/0.215/0.25 MG-35 MCG PO TABS
1 | Freq: Every day | ORAL | 3 refills | Status: AC
Start: 2020-04-10 — End: ?

## 2020-04-10 NOTE — Progress Notes
[facility]  [address], [address]  [place] [address]  Phone: [phone]  Fax: [phone]    Internal Medicine Annual Wellness Visit  Provider/Author: Leta Baptist, MD  Patient's Name: [name] [name]  MRN: [id number]  DOB: 1985/11/30  Visit Date:04/10/2020    Chief Complaint: Annual Exam    HPI   [name] [name] is a 34 y.o. female who is here for their annual exam:    #Annual Wellness Visit  Social History     Social History Narrative    Diet:    Restrictions - regular diet.    Comments - red meat rarely. Fast food, none. Soda, none. +veggies +fruits.        Exercise:    Type - walking, daily.      Blood pressure - acceptable. No lightheadedness.  Weight - acceptable.    Vaccinations discussed. Needs booster for COVID vaccination.  Needs flu vaccination, wishes to defer due to upcoming test. Unsure last Tetanus.  Cancer Screening discussed.  Sexual health - no new partners in the past year. Defers STI testing.    Prob List     Patient Active Problem List   Diagnosis   ??? Raynaud phenomenon   ??? Globus sensation   ??? Heat intolerance   ??? Lumbar disc disease with radiculopathy   ??? Encounter for female birth control       PMH     Past Medical History:   Diagnosis Date   ??? Chicken pox    ??? GERD (gastroesophageal reflux disease)    ??? 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 Seasonal allergies        PSxH     Past Surgical History:   Procedure Laterality Date   ??? WISDOM TOOTH EXTRACTION         ALL   No Known Allergies    MEDS     Medications that the patient states to be currently taking   Medication Sig   ??? norgestimate-ethinyl estradiol (ORTHO TRI-CYCLEN, TRI SPRINTEC) 0.18/0.215/0.25 mg-35 mcg tablet Take 1 tablet by mouth daily.   ??? [DISCONTINUED] norgestimate-ethinyl estradiol (ORTHO TRI-CYCLEN, TRI SPRINTEC) 0.18/0.215/0.25 mg-35 mcg tablet Take 1 tablet by mouth daily.     Current Facility-Administered Medications for the 04/10/20 encounter (Office Visit) with Resa Miner., MD   Medication   ??? lidocaine 2% jelly 2 mL       [facility] AND [facility]     Family History   Problem Relation Age of Onset   ??? Lupus Mother    ??? Scleroderma Father    ??? Arthritis Father    ??? Breast cancer Maternal Grandmother 73   ??? Gout Maternal Grandfather    ??? Stroke Paternal Grandmother    ??? Arthritis Paternal Grandmother    ??? Skin cancer Paternal Grandfather    ??? Stroke Paternal Grandfather    ??? Breast cancer Maternal Great-Grandmother      Social History     Tobacco Use   ??? Smoking status: Never Smoker   ??? Smokeless tobacco: Never Used   Substance Use Topics   ??? Alcohol use: Yes     Alcohol/week: 0.6 oz     Types: 1 Glasses of Wine (5 oz) per week     Comment: social        ROS   []  10-point ROS reviewed: normal unless stated in HPI            []   reviewed questionnaire on 04/10/2020 (updated in chart)    Review of Systems 04/10/2020   Fevers No   Unusual fatigue No   Runny nose No   Sore throat No   Chest pain No   Swelling of legs/ankles No   Cough No   Shortness of breath No   Nausea No   Vomiting No   Abdominal pain No   Diarrhea No   Constipation No   Genital lesions No   Unusual discharge No   Pain on urination No   Body aches No   Joint aches No   Skin lesions No   Rash No   Weakness No   Numbness No   Depression No   Anxiety No   Weight change No   Hair loss No   Unusually frequent urination No   Bleeding No   Swollen lymph nodes (glands) No   Allergies No   Unusually frequent infections No        Behavioral Health Screening   PHQ-9 Results  Depression Screening (Patient Health Questionnaire PHQ) 04/20/2018 04/17/2019 04/25/2019 04/30/2019 04/10/2020   PHQ-2: Feeling down, depressed, or hopeless No No - - -   PHQ-2: Little interest or pleassure in doing things No No - - -   Little interest or pleasure in doing things - - Not at all Not at all Not at all   Feeling down, depressed, or hopeless - - Not at all Not at all Not at all   Trouble falling or staying asleep, or sleeping too much - - Not at all Not at all Several days   Feeling tired or having little energy - - Not at all Not at all Not at all   Poor appetite or overeating - - Not at all Not at all Not at all   Feeling bad about yourself - or that you are a failure or have let yourself or your family down - - Not at all Not at all Not at all   Trouble concentrating on things, such as reading the newspaper or watching television - - Not at all Not at all Not at all   Moving or speaking so slowly Or being so fidgety or restless - - Not at all Not at all Not at all   Thoughts that you would be better off dead, or of hurting yourself in some way - - Not at all Not at all Not at all   Total Score - - 0 0 1       GAD-7 Results  GAD-7 04/25/2019 04/30/2019 04/10/2020   Feeling nervous, anxious, or on edge 0-Not at all 0-Not at all 0-Not at all   Not being able to stop or control worrying 0-Not at all 0-Not at all 0-Not at all   Worrying too much about different things 0-Not at all 0-Not at all 0-Not at all   Trouble relaxing 0-Not at all 0-Not at all 0-Not at all   Being so restless that is hard to sit still 0-Not at all 0-Not at all 0-Not at all   Becoming easily annoyed or irritable 0-Not at all 0-Not at all 0-Not at all   Feeling afraid, as if something awful might happen 0-Not at all 0-Not at all 0-Not at all   Total Score 0 0 0       Audit-C results  AUDIT-C 04/25/2019 04/30/2019 04/10/2020   How often do you have a drink containing alcohol? 2 2 1  How many standard drinks containing alcohol do you have on a typical day? 0 0 0   How often do you have six or more drinks on one occasion? 0 0 0   Total Score 2 2 1         PHYSICAL EXAM     Vitals:    04/10/20 1043   BP: 95/61   Pulse: 89   Resp: 18   Temp: 36.2 ???C (97.1 ???F)   TempSrc: Temporal   SpO2: 100%   Weight: 54.4 kg (120 lb)   Height: 1.6 m (5' 3'')     Body mass index is 21.26 kg/m???.    Vital signs reviewed  Consitutional: well appearing adult, in no acute distress  Eyes: normal conjunctiva, sclera anicteric, PERRLA, EOMI  ENT: normocephalic, atraumatic, auditory canal and TM normal in appearance, nasal mucosa moist and without drainage, moist oral mucosa, no oral lesions  Neck: supple, trachea midline, no thyromegaly, nodules or goiter  Respiratory: breathing normally, no distress, lungs clear to auscultation bilaterally, no wheezes, rales or rhonchi  Cardiovascular: regular rate and rhythm, S1/S2 present, no murmur, rubs or gallops, no JVD  Spine: normal curvature, without deformity or tenderness to palpation  Abdomen: normoactive bowel sounds, soft, nontender, nondistended  Lymphatic: no cervical or supraclavicular lymphadenopathy  MSK: gait is normal, normal range of motion of the extremities, no clubbing, cyanosis or edema  Skin: color normal for stated race, no rash or discrete lesions  Neurologic: CN II-XII intact. Strength full throughout. Sensation intact to light touch.  Psych: normal insight and judgement, appropriate affect, mood euthymic    LABS/STUDIES   I have:   [x]  Reviewed/ordered []  1 []  2 [x]  ? 3 unique laboratory, radiology, and/or diagnostic tests noted below    []  Reviewed []  1 []  2 []  ? 3 prior external notes and incorporated into patient assessment    []  Discussed management or test interpretation with external provider(s) as noted      A&P   [name] [name] is a a 34 y.o. female presenting for Annual Exam        ICD-10-CM    1. Wellness examination  Z00.00 Comprehensive Metabolic Panel     Lipid Panel     Hgb A1c     TSH with reflex FT4, FT3     MTB-Quantiferon-Gold Plus ELISA   2. Need for immunization against influenza  Z23 Influenza vaccine IM;   PF   3. Exposure to TB  Z20.1 MTB-Quantiferon-Gold Plus ELISA   4. Encounter for female birth control  Z30.019 norgestimate-ethinyl estradiol (ORTHO TRI-CYCLEN, TRI SPRINTEC) 0.18/0.215/0.25 mg-35 mcg tablet        #Annual Wellness Visit  Exam:  Blood pressure checked and acceptable.  BMI checked and patient is normal weight.  Cervical cancer screening utd.    Counseling/Screening:  I counseled on diet and exercise.  I counseled on routine dental care.  I counseled on regular eye exams.  I counseled on sunscreen use.  Depression screening performed and acceptable (unless addressed below).  Anxiety screening performed and acceptable (unless addressed below).  Alcohol use screening performed and acceptable (unless addressed below).    Immunizations:   Influenza vaccination discussed.    Lab testing:   Metabolic panel  Hemoglobin A1c  Lipid screen    Imaging/Procedures:   None.    Health Maintenance   Topic Date Due   ??? Influenza Vaccine (1) 02/27/2020   ??? Tdap/Td Vaccine (1 - Tdap) 10/09/2020 (Originally 09/01/2005)   ???  Cervical Ca Screening: HPV Testing  04/29/2024   ??? Cervical Ca Screening: PAP Smear  04/29/2024   ??? Hepatitis C Screening  Completed   ??? COVID-19 Vaccine  Completed   ??? HIV Screening  Completed      Follow Up: Return in about 1 year (around 04/10/2021) for CPE.    The above plan of care including diagnosis, orders, and follow-up were discussed with the patient, who expressed understanding and agreement. The patient was given the opportunity to ask questions which were answered satisfactorily during the visit.    Author:  Waverly Ferrari. Dub Mikes, MD 04/10/2020 11:21 AM  Assistant Clinical Professor  [employer] [place]  Department of [facility]    This note was in part dictated. Please excuse any typos as a result.

## 2020-04-10 NOTE — Patient Instructions
-   Your sleep study did not demonstrate sleep apnea.  - Consider seeing therapist for possibly some anxiety that may be contributing to poor sleep  - Return to clinic as needed for recurrent witnessed events where you stop breathing or if you develop snoring, or other other respiratory issues.

## 2020-04-10 NOTE — Progress Notes
PATIENT: [name] [name]  MRN: [id number]  DOB: 1986-05-31  DATE OF SERVICE: 04/10/2020    CHIEF COMPLAINT:   Chief Complaint   Patient presents with   ??? Follow-up     pt in for sleep study results        Subjective:      [name] [name] is a 34 y.o. female here for follow up sleep study.  She had a PSG done demonstrating AHI of 1/hr.  She has been told by some people that she snores and by others that she does.  When she wakes up in the morning, she does not feel well-rested.  Prior to COVID, she would shower every morning, which she feels helped wake her.  Since COVID, she now showers at night.  She feels tired today, but is studying for a big exam, so her sleep is ''off.'' She thinks maybe she tosses and turns in the morning.  She feels she switches her position in the early morning.  She also notices that if she has an ''active mind'' and is thinking of concerns she has or interpersonal matters, her sleep seems to be worse.  She tries to do stretching or yoga before bed.  She has tried meditation and insight timer and she feels that helped her get to sleep.    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  Social history:  Lifelong nonsmoker.  Denies drug use.  Drinks 1 alcoholic drink per week.  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  No pets.  Has lived in her current home since 01/2018  ???  Family history:  Mother alive at age 7 with lupus vs Sjogrens and OSA.  Father alive at age 35 with OSA and has scleroderma and arthritis.  Brother healthy.  No children.      Review of Systems      Objective:       Last Recorded Vital Signs:    04/10/20 1004   BP: 95/61   Pulse: 89   Resp: 16   Temp: 36.2 ???C (97.1 ???F)   SpO2: 96%     Vitals:    04/10/20 1004   Weight: 120 lb 9.6 oz (54.7 kg)   Height: 5' 3'' (1.6 m)     Body mass index is 21.36 kg/m???.    Physical Exam  Constitutional:       General: She is not in acute distress.     Appearance: She is normal weight. She is not ill-appearing, toxic-appearing or diaphoretic.   HENT:      Head: Normocephalic and atraumatic.   Pulmonary:      Effort: Pulmonary effort is normal.   Neurological:      General: No focal deficit present.      Mental Status: She is alert and oriented to person, place, and time.   Psychiatric:         Mood and Affect: Mood normal.         Behavior: Behavior normal.     PSG done 03/19/20:  Impression:     1. The study demonstrates no evidence of obstructive sleep apnea   with AHI of 1/hr (RDI 3/hr), worse in supine position and   associated with oxygen desaturation down to 90.0% from baseline   of 97.0%.   2. The patient slept 6.1 hours out of a total 6.5 hours bedtime  monitored, yielding normal sleep efficiency of 94%. Latency to   sleep onset was 12.0 minutes, which was normal.   3. EKG demonstrated normal sinus rhythm   4. The EEG was normal.   5. The EMG was normal.   ???   Diagnosis:   ???   Axis I: Witnessed episodes of apneas (R06.81)   Axis II: Nocturnal Polysomnogram baseline study, 95810. ???   ???   Recommendation:   ???   1) ??? ??? ??? Consider further evaluation and treatment for   depression, PHQ-9: 5 (normal less than 5).   2) ??? ??? ??? ???Depending on clinical findings, intra-oral device for   mandibular advancement may be an effective treatment option for   primary snoring.   3) ??? ??? ??? Patient should be advised to maintain sleep in the   lateral positions until starting additional treatment.   4) ??? ??? ??? The patient should follow good sleep hygiene by avoiding   sedatives, alcohol and/or major amounts of food or fluid before   bedtime.   5) ??? ??? Patient should try to maintain set bed and wake times. ???If   the patient does not meet sleep needs at night, afternoon naps   should be taken to supplement.   6) ??? ???The patient should be advised not to drive when sleepy. ???         Assessment:       61F presents for follow up for witnessed nocturnal apneas and daytime somnolence.  She had a PSG done, which did not demonstrate OSA.  She did not have PLM on the sleep study.  She notes she lost 7 lbs between when she had the witnessed apneas and the sleep study.  Given she describe her sleep as being disturbed by ''an active mind,'' there may be a component of anxiety to her sleep issues.  She is amenable to a therapist, but would like to hold off on a referral, as her insurance may be changing soon.  Of note, both of her parents have OSA, which may increase her risk of developing OSA in the future.      Plan:       - No OSA on PSG  - Consider referral to a therapist to address possible anxiety  - Return to clinic PRN if she develops snoring, recurrent witnessed apnea when asleep, or other sleep or respiratory issues.          Author:  Tommy Rainwater. Asia Dusenbury 04/10/2020 10:18 AM     99213 - I spent 20-29 minutes (established patient). When time (rather than medical decision making) is used for determination of Level of Service for this encounter, the time includes any time spent reviewing the chart on the day of the visit, time spent face to face with the patient, time spent documenting the visit on the day of the visit, and may include counseling and coordination of care also performed on the day of the visit.  PROBLEM -  Low complexity   1 acute uncomplicated illness was addressed during this encounter.  DATA COMPLEXITY - Low complexity - review tests, order tests, review notes (2/3) OR indep historian  RISK - No risk   No significant risk of morbidity from diagnostic testing or treatment.

## 2020-04-18 ENCOUNTER — Ambulatory Visit: Payer: BLUE CROSS/BLUE SHIELD

## 2020-04-22 ENCOUNTER — Ambulatory Visit: Payer: BLUE CROSS/BLUE SHIELD

## 2020-04-22 DIAGNOSIS — Z1283 Encounter for screening for malignant neoplasm of skin: Secondary | ICD-10-CM

## 2020-04-22 DIAGNOSIS — D229 Melanocytic nevi, unspecified: Secondary | ICD-10-CM

## 2020-04-22 NOTE — Patient Instructions
What to look for when monitoring your moles:        A-B-C-D-E guide     Characteristics of unusual moles that may indicate melanomas or other skin cancers follow the A-B-C-D-E guide developed by the [employer]:  ? A is for asymmetrical shape. Look for moles with irregular shapes, such as two very different-looking halves.  ? B is for irregular border. Look for moles with irregular, notched or scalloped borders, these may be characteristics of melanomas.  ? C is for changes in color. Look for growths that have many colors or an uneven distribution of color, shades of tan, brown, black, sometimes white, red or blue  ? D is for diameter. Look for new growth in a mole larger than about 1/4 inch (6 millimeters).  ? E is for evolving. Look for changes over time, such as a mole that grows in size or that changes color or shape. Moles may also evolve to develop new signs and symptoms, such as new itchiness or bleeding.    Other suspicious changes in a mole may include: Scaly skin, itching, spreading of pigment from the mole into the surrounding skin, oozing or bleeding. Cancerous (malignant) moles vary greatly in appearance. Some may show all of the changes listed above, while others may have only one or two unusual characteristics.     Please follow up in 1 year for a skin exam or sooner for any new, changing, evolving, or concerning moles.

## 2020-04-22 NOTE — Progress Notes
Dermatology Patient Note:    Patient: [name] [name]  MRN: [id number]  Date of Birth: [birth year]  Date of Service: 04/22/2020    Referring Provider: No ref. provider found  Primary Care Provider: Resa Miner., MD  Chief Complaint:   Chief Complaint   Patient presents with   ??? General Skin Check         History of present illness:  [name] [name], a 34 y.o. year old female referred by No ref. provider found for evaluation of skin exam  ?Last dermatology clinic visit: 04/2018    Patient denies any changing lesions of concern.    Patient has no personal history of melanoma or non-melanoma skin cancer  No history of atypical nevus    Past Medical History:   Diagnosis Date   ??? Chicken pox    ??? GERD (gastroesophageal reflux disease)    ??? 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 Seasonal allergies      Past Surgical History:   Procedure Laterality Date   ??? WISDOM TOOTH EXTRACTION       Family History   Problem Relation Age of Onset   ??? Lupus Mother    ??? Scleroderma Father    ??? Arthritis Father    ??? Breast cancer Maternal Grandmother 62   ??? Gout Maternal Grandfather    ??? Stroke Paternal Grandmother    ??? Arthritis Paternal Grandmother    ??? Skin cancer Paternal Grandfather    ??? Stroke Paternal Grandfather    ??? Breast cancer Maternal Great-Grandmother      Social History     Socioeconomic History   ??? Marital status: Single     Spouse name: Not on file   ??? Number of children: Not on file   ??? Years of education: Not on file   ??? Highest education level: Not on file   Occupational History   ??? Occupation: psychology fellow     Employer: [employer]   Social Needs   ??? Financial resource strain: Not on file   ??? Food insecurity     Worry: Not on file     Inability: Not on file   ??? Transportation needs     Medical: Not on file     Non-medical: Not on file   Tobacco Use   ??? Smoking status: Never Smoker   ??? Smokeless tobacco: Never Used   Substance and Sexual Activity   ??? Alcohol use: Yes     Alcohol/week: 0.6 oz     Types: 1 Glasses of Wine (5 oz) per week     Comment: social    ??? Drug use: No   ??? Sexual activity: Yes     Partners: Male     Birth control/protection: Oral Contraceptive Pill   Lifestyle   ??? Physical activity     Days per week: Not on file     Minutes per session: Not on file   ??? Stress: Not on file   Relationships   ??? Social Wellsite geologist on phone: Not on file     Gets together: Not on file     Attends religious service: Not on file     Active member of club or organization: Not on file     Attends meetings of clubs or organizations: Not on file     Relationship status: Not on file   Other Topics Concern   ??? Do you exercise at  least a day, 3 or more days a week? Yes   ??? Do you follow a special diet? No   ??? Vegan? No   ??? Vegetarian? No   ??? Pescatarian? No   ??? Lactose Free? No   ??? Gluten Free? No   ??? Omnivore? No   Social History Narrative    Diet:    Restrictions - regular diet.    Comments - red meat rarely. Fast food, none. Soda, none. +veggies +fruits.        Exercise:    Type - walking, daily.     Patient Active Problem List   Diagnosis   ??? Raynaud phenomenon   ??? Globus sensation   ??? Heat intolerance   ??? Lumbar disc disease with radiculopathy   ??? Encounter for female birth control     Medications that the patient states to be currently taking   Medication Sig   ??? norgestimate-ethinyl estradiol (ORTHO TRI-CYCLEN, TRI SPRINTEC) 0.18/0.215/0.25 mg-35 mcg tablet Take 1 tablet by mouth daily.     Current Facility-Administered Medications for the 04/22/20 encounter (Office Visit) with Dorise Hiss, MD   Medication   ??? lidocaine 2% jelly 2 mL     No Known Allergies    Review of Systems:   Constitutional: The patient feels well. No fevers or weight loss.  Skin: otherwise negative  ?  Physical Exam:   Gen: pleasant, NAD, appears as stated age  Eyes (lids/conjunctiva): examined  Head: examined  Scalp: examined  Lips: examined  Neck: examined  Chest/breasts/axillae: examined  Back: examined Abdomen: examined  Buttocks: examined  L upper extremity: examined  R upper extremity: examined  L lower extremity: examined  R lower extremity: examined  Digits/nails: examined    All areas examined were within normal limits with the following exceptions:    - Multiple tan to brown macules and papules scattered on face, trunk, extremities. Symmetric, uniform in color, regular borders    Visit Diagnoses:  1. Multiple benign nevi    2. Screening for malignant neoplasm of skin        Assessment & Plan:    1. Multiple benign nevi  Recommended biopsies: none  Discussed ABCDEs of moles and melanoma  Recommended daily physical sunblock SPF 30+ and monthly self skin exams  Annual dermatologic exams  Ophtho and in females Gyn exams recommended  Vitamin D discussed  Education provided    2. Screening for malignant neoplasm of skin        Sun protection strategies and skin cancer risk factors reviewed with patient.     Follow-up:  1 year , or as needed sooner for any new, changing, evolving, or concerning lesions or symptoms.    The above plan of care, diagnosis, orders, and follow-up were discussed with the patient.  Questions related to this recommended plan of care were answered.  ?    Dorise Hiss, MD  04/22/2020  10:13 AM  Division of Dermatology  [facility]

## 2020-04-23 ENCOUNTER — Institutional Professional Consult (permissible substitution): Payer: BLUE CROSS/BLUE SHIELD

## 2020-04-23 DIAGNOSIS — Z201 Contact with and (suspected) exposure to tuberculosis: Secondary | ICD-10-CM

## 2020-04-23 DIAGNOSIS — Z23 Encounter for immunization: Secondary | ICD-10-CM

## 2020-04-23 DIAGNOSIS — Z0000 Encounter for general adult medical examination without abnormal findings: Secondary | ICD-10-CM

## 2020-04-23 LAB — Comprehensive Metabolic Panel
CALCIUM: 9.3 mg/dL (ref 8.6–10.4)
GFR ESTIMATE FOR AFRICAN AMERICAN: >89 mL/min/{1.73_m2} (ref 3.6–5.3)

## 2020-04-23 LAB — Lipid Panel: TRIGLYCERIDES: 102 mg/dL (ref <150)

## 2020-04-23 LAB — TSH with reflex FT4, FT3: TSH: 1.2 u[IU]/mL (ref 0.3–4.7)

## 2020-04-24 LAB — Hgb A1c: HGB A1C - HPLC: 5.5 (ref <5.7)

## 2020-04-25 ENCOUNTER — Ambulatory Visit: Payer: BLUE CROSS/BLUE SHIELD

## 2020-04-27 LAB — MTB-Quantiferon Gold Plus ELISA: MTB-QUANTIFERON GOLD PLUS ELISA: NEGATIVE

## 2020-07-23 ENCOUNTER — Ambulatory Visit: Payer: BLUE CROSS/BLUE SHIELD

## 2020-09-29 ENCOUNTER — Ambulatory Visit: Payer: Self-pay

## 2021-01-22 MED ORDER — NORGESTIM-ETH ESTRAD TRIPHASIC 0.18/0.215/0.25 MG-35 MCG PO TABS
0 refills | Status: AC
Start: 2021-01-22 — End: ?

## 2021-03-31 ENCOUNTER — Telehealth: Payer: BLUE CROSS/BLUE SHIELD

## 2021-04-01 DIAGNOSIS — Z30019 Encounter for initial prescription of contraceptives, unspecified: Secondary | ICD-10-CM

## 2021-04-01 MED ORDER — NORGESTIM-ETH ESTRAD TRIPHASIC 0.18/0.215/0.25 MG-35 MCG PO TABS
1 | Freq: Every day | ORAL | 0 refills | Status: AC
Start: 2021-04-01 — End: ?

## 2021-04-01 NOTE — Telephone Encounter
Vm left informing pt of new RX

## 2021-04-01 NOTE — Telephone Encounter
Call Back Request      Reason for call back: pt would like to know if provider may be able to prescribed pt her birth control medication just until her appt since she is running out soon and she just needs enough till her appt. Please contact pt if provider is able to send that prescription for her. Confirmed pharmacy on file [id number] - [place], [place], [place].     Any Symptoms:  []  Yes  [x]  No       If yes, what symptoms are you experiencing:    o Duration of symptoms (how long):    o Have you taken medication for symptoms (OTC or Rx):      If call was taken outside of clinic hours:    [x] Patient or caller has been notified that this message was sent outside of normal clinic hours.     [] Patient or caller has been warm transferred to the physician's answering service. If applicable, patient or caller informed to please call us back if symptoms progress.  Patient or caller has been notified of the turnaround time of 1-2 business day(s).

## 2021-04-13 ENCOUNTER — Ambulatory Visit: Payer: Self-pay

## 2021-04-26 DIAGNOSIS — Z0000 Encounter for general adult medical examination without abnormal findings: Secondary | ICD-10-CM

## 2021-04-26 DIAGNOSIS — E7800 Pure hypercholesterolemia, unspecified: Secondary | ICD-10-CM

## 2021-04-26 NOTE — Progress Notes
[facility]  [address]  [place] [address]  Phone: [phone]  Fax: [phone]    Internal Medicine Annual Wellness Visit  Provider/Author: Leta Baptist, MD  Patient's Name: [name] [name]  MRN: [id number]  DOB: 02-Jul-1985  Visit Date:04/27/2021    Chief Complaint: Annual Exam (Non-fasting )    ANNUAL WELLNESS VISIT   [name] [name] is a 35 y.o. female who is here for their annual exam:    Social History     Social History Narrative    Diet: regular diet. meat rarely. Fast food, none. Soda, none. +veggies +fruits.    Exercise: hiking. 3+ days per week. On average 30-180 minutes    Alcohol: once weekly, 2 drinks on average.    Occupation: psychologist at [employer]    Sleep: ''hit or miss''      Blood pressure - acceptable  Weight - acceptable  Menses - regular on COCPs; no breakthrough breathing. Irregular prior to COCPs. Start in @ age 80.    Vaccinations discussed. Recommend COVID booster. Flu vaccine last week.   Cancer Screening discussed.  Labs/imaging/procedures/vaccinations are as ordered.    Counseling/Screening:  I counseled on diet and exercise.  I counseled on routine dental care.    Health Maintenance   Topic Date Due   ? Hepatitis B Screening  Never done   ? COVID-19 Vaccine(Tracks primary and booster doses, not sup/immunocomp) (4 - Booster for [employer] series) 07/28/2021 (Originally 06/22/2020)   ? Cervical Ca Screening: HPV Testing  04/29/2024   ? Cervical Ca Screening: PAP Smear  04/29/2024   ? Tdap/Td Vaccine (2 - Td or Tdap) 12/28/2026   ? Influenza Vaccine  Completed   ? Hepatitis C Screening  Completed   ? HIV Screening  Completed        The following separately identifiable issues were evaluated during this visit:     PROBLEM FOCUSED HPI, ASSESSMENT and PLAN     Tenderness of the left ear.  Onset a week ago.    Red marks on the R arm. Looks like a scratch but hasn't gone away quickly.  Onset 04/22/2021.    Sleep is ''hit or miss''  Wakes up early. Hears noises.  If she has a lot on her mind.  On average 6 hours per night.  Somewhat restorative.   Occasional melatonin.    Verbally told to f/u in 1 year for repeat mammogram, although report states regular screening at age 63.      A/P:    1. Wellness examination    2. Pure hypercholesterolemia    3. Encounter for female birth control    4. Diabetes mellitus screening    5. Thyroid disorder screen    6. Need for hepatitis B screening test    7. Encounter for screening mammogram for malignant neoplasm of breast    8. Encounter for fertility preservation counseling         #abnormal mammogram  #breast cancer screening Last Assessed: 04/27/2021   - repeat mammogram      Chronic Conditions:    #hyperlipidemia Last Assessed: 04/27/2021  Diet.    Lab Results   Component Value Date    CHOL 199 04/23/2020    CHOLDLCAL 109 (H) 04/23/2020    CHOLHDL 70 04/23/2020    TRIGLY 102 04/23/2020        #Raynaud's phenomenon Last Assessed: 04/27/2021  Stable.   - avoid triggers   - consider CCB if  more bothersome    #contraception mgmt Last Assessed: 04/27/2021   - Ortho tri-cyclen    1. Wellness examination     2. Pure hypercholesterolemia  Comprehensive Metabolic Panel    Lipid Panel    Hgb A1c    TSH with reflex FT4, FT3   3. Encounter for female birth control  norgestimate-ethinyl estradiol (ORTHO TRI-CYCLEN, TRI SPRINTEC) 0.18/0.215/0.25 mg-35 mcg tablet   4. Diabetes mellitus screening  Hgb A1c   5. Thyroid disorder screen  TSH with reflex FT4, FT3   6. Need for hepatitis B screening test  HBS Antigen   7. Encounter for screening mammogram for malignant neoplasm of breast  Mammo tomosynthesis, screening, bilat breast   8. Encounter for fertility preservation counseling  Referral to Obstetrics & Gynecology        Follow Up: Return in about 1 year (around 04/27/2022) for CPE.     Prob List     Patient Active Problem List   Diagnosis   ? Raynaud phenomenon   ? Globus sensation   ? Heat intolerance   ? Lumbar disc disease with radiculopathy   ? Encounter for female birth control   ? Hyperlipidemia       PMH     Past Medical History:   Diagnosis Date   ? Chicken pox    ? GERD (gastroesophageal reflux disease)    ? 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 Seasonal allergies        PSxH     Past Surgical History:   Procedure Laterality Date   ? WISDOM TOOTH EXTRACTION         ALL   No Known Allergies    MEDS     Medications that the patient states to be currently taking   Medication Sig   ? norgestimate-ethinyl estradiol (ORTHO TRI-CYCLEN, TRI SPRINTEC) 0.18/0.215/0.25 mg-35 mcg tablet Take 1 tablet by mouth daily.   ? [DISCONTINUED] norgestimate-ethinyl estradiol (ORTHO TRI-CYCLEN, TRI SPRINTEC) 0.18/0.215/0.25 mg-35 mcg tablet Take 1 tablet by mouth daily.       [facility] AND [facility]     Family History   Problem Relation Age of Onset   ? Lupus Mother    ? Scleroderma Father    ? Arthritis Father    ? Breast cancer Maternal Grandmother 95   ? Gout Maternal Grandfather    ? Stroke Paternal Grandmother    ? Arthritis Paternal Grandmother    ? Skin cancer Paternal Grandfather    ? Stroke Paternal Grandfather    ? Breast cancer Maternal Great-Grandmother      Social History     Tobacco Use   ? Smoking status: Never Smoker   ? Smokeless tobacco: Never Used   Substance Use Topics   ? Alcohol use: Yes     Alcohol/week: 0.6 oz     Types: 1 Glasses of Wine (5 oz) per week     Comment: social        ROS   []  10-point ROS reviewed: normal unless stated in HPI            []  reviewed questionnaire on 04/27/2021 (updated in chart)    Review of Systems 04/10/2020   Fevers No   Unusual fatigue No   Runny nose No   Sore throat No   Chest pain No   Swelling of legs/ankles No   Cough No   Shortness of breath No   Nausea No  Vomiting No   Abdominal pain No   Diarrhea No   Constipation No   Genital lesions No   Unusual discharge No   Pain on urination No   Body aches No   Joint aches No   Skin lesions No   Rash No   Weakness No Numbness No   Depression No   Anxiety No   Weight change No   Hair loss No   Unusually frequent urination No   Bleeding No   Swollen lymph nodes (glands) No   Allergies No   Unusually frequent infections No        Behavioral Health Screening   PHQ-9 total score:   PH-9 TOTAL SCORE [place] 04/10/2020   Total Score 1        GAD-7 total score:   GAD-7 TOTAL SCORE [place] 04/10/2020   Total Score 0        Audit-C total score:   AUDIT-C 04/10/2020   How often do you have a drink containing alcohol? 1   How many drinks containing alcohol do you have on a typical day when you are drinking? 0   How often do you have six or more drinks on one occasion? 0   Total Score 1         PHYSICAL EXAM     Vitals:    04/27/21 0804   BP: 99/70   Pulse: 88   Resp: 18   Temp: 36.4 ?C (97.6 ?F)   TempSrc: Forehead   SpO2: 98%   Weight: 127 lb 6.4 oz (57.8 kg)   Height: 5' 3'' (1.6 m)     Body mass index is 22.57 kg/m?Marland Kitchen    Vital signs reviewed  Consitutional: well appearing adult, in no acute distress  Eyes: normal conjunctiva, sclera anicteric, PERRLA, EOMI  ENT: normocephalic, atraumatic, auditory canal and TM normal in appearance, nasal mucosa moist and without drainage, moist oral mucosa, no oral lesions  Neck: supple, trachea midline, no thyromegaly, nodules or goiter  Respiratory: breathing normally, no distress, lungs clear to auscultation bilaterally, no wheezes, rales or rhonchi  Cardiovascular: regular rate and rhythm, S1/S2 present, no murmur, rubs or gallops  Spine: normal curvature, without deformity or tenderness to palpation  Abdomen: normoactive bowel sounds, soft, nontender, nondistended  Lymphatic: no cervical or supraclavicular lymphadenopathy  MSK: gait is normal, normal range of motion of the extremities, no edema  Skin: color normal for stated race, no rash or discrete lesions  Psych: normal insight and judgement, appropriate affect, mood euthymic  Neurologic: CN II-XII intact. Strength full throughout. Sensation intact to light touch.    LABS/STUDIES      Lab Studies:  Last Lipid Panel:  Results for orders placed or performed in visit on 04/23/20   Lipid Panel   Result Value Ref Range    Cholesterol 199 See Comment mg/dL    Cholesterol,LDL,Calc 109 (H) <100 mg/dL    Cholesterol, HDL 70 >50 mg/dL    Triglycerides 161 <096 mg/dL    Non-HDL,Chol,Calc 045 <130 mg/dL     Last W0J:  Results for orders placed or performed in visit on 04/23/20   Hgb A1c   Result Value Ref Range    Hgb A1c - HPLC 5.5 <5.7 %     Last CMP:  Results for orders placed or performed in visit on 04/23/20   Comprehensive Metabolic Panel   Result Value Ref Range    Sodium 139 135 - 146 mmol/L    Potassium 3.8  3.6 - 5.3 mmol/L    Chloride 102 96 - 106 mmol/L    Total CO2 27 20 - 30 mmol/L    Anion Gap 10 8 - 19 mmol/L    Glucose 89 65 - 99 mg/dL    Creatinine 4.78 2.95 - 1.30 mg/dL    GFR Estimate for African American >89 See GFR Additional Information mL/min/1.91m2    GFR Estimate for Non-African American >89 See GFR Additional Information mL/min/1.85m2    GFR Additional Information See Comment     Urea Nitrogen 10 7 - 22 mg/dL    Calcium 9.3 8.6 - 62.1 mg/dL    Total Protein 7.2 6.1 - 8.2 g/dL    Albumin 4.3 3.9 - 5.0 g/dL    Bilirubin,Total 0.5 0.1 - 1.2 mg/dL    Alkaline Phosphatase 43 37 - 113 U/L    Aspartate Aminotransferase 21 13 - 62 U/L    Alanine Aminotransferase 19 8 - 70 U/L     Last TSH with reflex:  Results for orders placed or performed in visit on 04/23/20   TSH with reflex FT4, FT3   Result Value Ref Range    TSH 1.2 0.3 - 4.7 mcIU/mL     Last CBC:  Results for orders placed or performed in visit on 04/28/18   CBC   Result Value Ref Range    White Blood Cell Count 8.54 4.16 - 9.95 x10E3/uL    Red Blood Cell Count 4.92 3.96 - 5.09 x10E6/uL    Hemoglobin 14.1 11.6 - 15.2 g/dL    Hematocrit 30.8 65.7 - 45.2 %    Mean Corpuscular Volume 88.4 79.3 - 98.6 fL    Mean Corpuscular Hemoglobin 28.7 26.4 - 33.4 pg    MCH Concentration 32.4 31.5 - 35.5 g/dL    Red Cell Distribution Width-SD 44.6 36.9 - 48.3 fL    Red Cell Distribution Width-CV 13.9 11.1 - 15.5 %    Platelet Count, Auto 291 143 - 398 x10E3/uL    Mean Platelet Volume 11.8 9.3 - 13.0 fL    Nucleated RBC%, automated 0.0 No Ref. Range %    Absolute Nucleated RBC Count 0.00 0.00 - 0.00 x10E3/uL    Neutrophil Abs (Prelim) 4.20 See Absolute Neut Ct. x10E3/uL   Differential, Automated   Result Value Ref Range    Neutrophil Percent, Auto 49.2 No Ref. Range %    Lymphocyte Percent, Auto 40.7 No Ref. Range %    Monocyte Percent, Auto 7.5 No Ref. Range %    Eosinophil Percent, Auto 1.2 No Ref. Range %    Basophil Percent, Auto 0.8 No Ref. Range %    Immature Granulocytes% 0.6 No Reference Range %    Absolute Neut Count 4.20 1.80 - 6.90 x10E3/uL    Absolute Lymphocyte Count 3.48 (H) 1.30 - 3.40 x10E3/uL    Absolute Mono Count 0.64 0.20 - 0.80 x10E3/uL    Absolute Eos Count 0.10 0.00 - 0.50 x10E3/uL    Absolute Baso Count 0.07 0.00 - 0.10 x10E3/uL    Absolute Immature Gran Count 0.05 (H) 0.00 - 0.04 x10E3/uL   CBC & Auto Differential    Narrative    The following orders were created for panel order CBC & Auto Differential.  Procedure                               Abnormality         Status                     ---------                               -----------         ------  AVW[[id number]]                                              Final result               Differential, Automated[[id number]]      Abnormal            Final result                 Please view results for these tests on the individual orders.     Last UA:  Results for orders placed or performed in visit on 05/16/19   Urinalysis,Routine    Specimen: Clean Catch, Midstream; Urine    Narrative    The following orders were created for panel order Urinalysis,Routine.  Procedure                               Abnormality         Status                     ---------                               -----------         ------ UA,Dipstick[[id number]]                                                                 UA,Microscopic[[id number]]                                                                Please view results for these tests on the individual orders.     Last Pap smear:  Results for orders placed or performed in visit on 04/30/19   Pap Smear   Result Value Ref Range    CASE REPORT       Gynecologic Cytology Report                       Case: [id number]                                Authorizing Provider:  Resa Miner., MD      Collected:           04/30/2019 1327              Ordering Location:     [facility]      Received:            05/01/2019 1349                                     [facility]                                                                        First Screen:          Govind, Lonia Farber, CT(ASCP)                                                      Specimen:    Cy Exo/Endo Smear Thin Prep-Screen, Exo/Endocervix                                         INTERPRETATION-RESULT Negative for Intraepithelial Lesion or Malignancy.      SPECIMEN ADEQUACY       Satisfactory for evaluation.  Endocervical cells/Transformation zone absent.    Signatures       I certify that I personally conducted a gross and/or microscopic examination(s) of the described specimen(s), and have reviewed the interpretation of this test and diagnosis(es).  I agree with the documented findings or edited the findings as necessary.       Last Cervical HPV:  Results for orders placed or performed in visit on 04/30/19   HPV DNA PCR,Genital    Specimen: Exo/Endocervix; Genital Cytology   Result Value Ref Range    HPV Type 16 Negative Negative for High Risk HPV DNA    HPV Type 18 Negative Negative for High Risk HPV DNA    Other High Risk HPV Negative Negative for High Risk HPV DNA    Narrative    Other High Risk HPV include: [phone] and 68.  Not tested for Low Risk HPV DNA.     Last HIV:  Results for orders placed or performed in visit on 04/28/18   HIV-1/2 Ag/Ab 4th Generation with Reflex Confirmation   Result Value Ref Range    HIV-1/2 Ag/Ab Screen 4th Generation Nonreactive Nonreactive    Narrative    Ingestion of high levels of biotin in dietary supplements may lead to falsely negative results.     Last Hep C:  Results for orders placed or performed in visit on 04/30/19   HCV Antibody Screen   Result Value Ref Range    HCV Ab Screen Nonreactive Nonreactive    Narrative    Ingestion of high levels of biotin in dietary supplements may lead to falsely negative results.      Last Hep B:  No results found for: HEPBSURABQ, HEPBSURABCON, HEPBCORABTOT        10-year ASCVD risk  cannot be calculated because at least one required variable is not available in [facility] as of 9:14 AM on 04/27/2021  10-year ASCVD risk with optimal risk factors cannot be calculated.  2018 ACC/AHA guidelines recommends that patient is not in statin benefit group. Encourage adherence to heart-healthy lifestyle.      Imaging Studies:   Last mammogram:  Results for orders placed during the hospital encounter of 05/21/19    Mammo tomosynthesis, screening, bilat breast 05/21/2019 (Final)  Impression  Finding 1:  There is no mammographic evidence of malignancy.    Finding 2:  Oval mass in the right breast at 9 o'clock, upper outer quadrant is probably benign.  Sonographic follow-up in 6 months is recommended.    In the left breast, no suspicious masses, calcifications or other abnormalities are seen.    Based on a modified IBIS risk calculator, this patient has an approximate 12% lifetime risk of  developing breast cancer.    BI-RADS Category 3:      Probably Benign Finding(s)    Report electronically signed by: Gayland Curry, M.D.          Report electronically signed by: Gayland Curry  05/22/2019 at 10:02:28 AM    Technologist: Elzie Rings      Follow Up: Return in about 1 year (around 04/27/2022) for CPE.    The above plan of care including diagnosis, orders, and follow-up were discussed with the patient, who expressed understanding and agreement. The patient was given the opportunity to ask questions which were answered satisfactorily during the visit.    Author:  Waverly Ferrari. Dub Mikes, MD 04/27/2021 9:14 AM  Assistant Clinical Professor  [employer] [facility] [place]  Department of [facility]    This note was in part dictated. Please excuse any typos as a result.

## 2021-04-27 ENCOUNTER — Ambulatory Visit: Payer: BLUE CROSS/BLUE SHIELD

## 2021-04-27 DIAGNOSIS — Z1329 Encounter for screening for other suspected endocrine disorder: Secondary | ICD-10-CM

## 2021-04-27 DIAGNOSIS — Z1231 Encounter for screening mammogram for malignant neoplasm of breast: Secondary | ICD-10-CM

## 2021-04-27 DIAGNOSIS — Z131 Encounter for screening for diabetes mellitus: Secondary | ICD-10-CM

## 2021-04-27 DIAGNOSIS — Z3162 Encounter for fertility preservation counseling: Secondary | ICD-10-CM

## 2021-04-27 DIAGNOSIS — Z1159 Encounter for screening for other viral diseases: Secondary | ICD-10-CM

## 2021-04-27 MED ORDER — NORGESTIM-ETH ESTRAD TRIPHASIC 0.18/0.215/0.25 MG-35 MCG PO TABS
1 | Freq: Every day | ORAL | 4 refills | Status: AC
Start: 2021-04-27 — End: ?

## 2021-04-27 NOTE — Patient Instructions
It was a pleasure to see you for your Annual Preventative Exam today. Remember that what is part of the covered preventative exam is actually limited. Chronic and new health conditions are often not covered and if we discussed many of these other things it may result in a co-pay, contingent on your insurance.    Please be patient with test results. Labs will be released to you automatically as soon as they are done. This means that you will see them before I do. I will typically review them and place comments on the labs themselves or send a message with a summary within a few days of ALL of the labs being completed. Imaging and other types of results are not automatically released and must be released manually. If you haven't heard from me a week after having the labs done don't hesitate to contact the office for updates.     Things to do that we discussed during today's visit:  - Ok to continue melatonin for sleep.  - For sleep, try a Valerian Root supplement. It is usually about 450mg  about 30-45 minutes before bedtime. Makes sure you have at least 7-8 hours to sleep so that you aren't groggy when you wake up. I usually recommended NatureMade brand as they are inexpensive and verified by an outside lab.   - We have gynecology in [facility] and [place]. In [facility], I recommend Dr Linward Foster or Dr Rogue Bussing. In [place], I recommend Dr Illene Labrador, Dr Lewanda Rife, Dr Blair Heys or Dr Alvy Beal. You can call [phone] to schedule at either location.   - If you want to know your blood type try [employer]oncard Blood typing kit. This is available on [facility] for about $10-20.     In addition to the plan above and specific recommendations we discussed during your visit, please consider the following things you can do to maintain your physical health.    Diet:  - In general, it is best to eat a well rounded diet with plenty of vegetables and lean proteins with a moderate amount of high quality carbohydrates (whole grain carbs such as brown rice, whole grain breads, quinoa, farro and other ancient grains).     - A colorful plate is a healthy plate. The colors in the fruits and veggies are where all of your anti-oxidants and anti-inflammatories come from. At least half of your plate should be veggies and fruit, with a focus on the veggies.    - Many different diets have been studied and it continues to appears that the best diet for preventing heart disease and diabetes is the Mediterranean diet. The [facility] has some great starter information on this diet:  TeleconferenceOnDemand.fr  BatPromos.tn    Carbs:  - Try to avoid simple sugars such as table sugar (sucrose) and products with lots of high fructose corn syrup as these can increase the risk of diabetes and lead to weight gain. You do still need carbs, but they should be the better quality carbs that I mentioned above. Carbs shouldn't be more than 45-60% of your daily diet.    Fat:  - The total consumption of fat is less important than the type of fat that you eat. Monounsaturated fats (such as those found in olive oil and nuts) and  polyunsaturated fats (omega-3s such as those found in fish) are the best. Try to avoid lots of saturated fat as these can increase cholesterol and heart disease. Trans-fats should be avoided or  eliminated.     Protein:  - Increasing protein in the diet and subsequently decreasing carbs can help with weight loss and maintenance. If you are trying to loose weight you can consider replacing a meal with a protein shake that is low in carbs. Be careful as many workout protein shakes can be higher in carbs. Having 25-30% of your daily calories from protein can boost metabolism and help with weight loss.    - Lean proteins are best and are found in fish, poultry, beans, legumes, lentils and nuts. Red meat (beef, veal, pork and lamb) is high in saturated fat, which can increase cholesterol and the risk of heart disease.     - If you are strength training with a goal of increasing lean muscle mass higher protein intake is necessary to see gains. I generally recommend 1.6 to 2.0 grams of protein for every kilogram of body weight (or 0.7 to 0.9 gram per pound of body weight).    Fiber:  - Fiber helps to slow down the absorption of food, helps you to feel full and helps to prevent constipation (as long as you are drinking plenty of fluids). At a minimum you need 14 grams of fiber for each 1000 calories in your diet. This is about 25-36 grams of fiber per day, depending on how much you are eating. More fiber can be helpful, and may help to prevent diabetes and heart disease. If you take a fiber supplement, always remember to take with plenty of water.    Supplements:  - In general, most well rounded diets contain all the vitamins and minerals that your body needs without any additional supplementation. If you are concerned you need supplementation a daily multivitamin should make up for any deficiencies. I typically recommend Ash[employer] Made brand as it can be found in any local pharmacy, is inexpensive and is verified by an outside labs to contain what the label says it contains.    - Vitamin B12 supplementation can be helpful for low energy. It is water soluble, so you can't really overdue it, but keep in mind that anything your body can't use you will pee out. This means that taking very high doses isn't likely to hurt you, but you are literally peeing away $$$.    - Biotin can help with hair and nails, but it also interferes with many of our lab tests. If you take a biotin supplement it is best to stop it for at least a week prior to have labs done.    - Other supplements such as turmeric may be helpful on a case by case basis. If you have questions about specific supplements please ask me during our next visit.    Alcohol:  - For women it is perfectly fine to have 1 standard size drink (1 glass of beer or wine or a single 1 oz serving of liquor) once a day. For men it is ok to have 2. People often drink a bit more than this on the weekends or special occasions. While we all like to splurge, this is best done only occasionally and having more than 3-4 drinks at any one time can have significant impacts on the body including: weight gain from extra calories, elevations in blood pressure and heart rate, increased risk of cardiovascular disease and diabetes, sleep disruption and insomnia, waking up at night to urinate, increased risk of physical and psychological dependence on alcohol.    Exercise:  - It is important to  exercise regularly to maintain your cardiovascular health and to prevent weight gain. It is also great for your mental health!  - We recommend a minimum of at least 150 minutes of exercise per week. That's thirty minutes 5 days per week or another combination, although it is probably better to spread things out at least a little bit.  - If you like the gym that is a great option, but if you aren't the kind of person who likes the gym then there is no need to pay for a membership that you won't use. Hiking, biking, running, even walking count!  - Remember, with exercise more is actually better, as long as you as you don't push so hard as to hurt yourself!    Ergonomics:  We have been seeing a lot of back and neck pain during the pandemic as people have been working from home, not always with the best set up. Here is a helpful video on youtube from the [employer] on workspace ergonomics: https://dixon.info/     Other topics:  - Save your skin! Always wear sunscreen if you are going to be outside. Remember that when you are driving, your left arm and the left side of your face are exposed to the sun. Using a facial moisturizer with some SPF is a great way to protect your skin, and apply a bit to the backs of the hands for extra protection.    -Don't use Q-tips! Instead, clean your ear non-traumatically using Debrox (or the pharmacy equivalent). Put 5-10 drops in your ear and let it sit for 1-2 minutes. Then let the liquid and wax drain out. You can do this once every 1-2 weeks to prevent buildup of ear wax.    -Always wear your seatbelt!    -If you haven't already, don't forget to sign up for the [facility] patient portal so that you can see your lab results and send messages to our office.

## 2021-04-30 ENCOUNTER — Institutional Professional Consult (permissible substitution): Payer: Self-pay

## 2021-04-30 DIAGNOSIS — Z1329 Encounter for screening for other suspected endocrine disorder: Secondary | ICD-10-CM

## 2021-04-30 DIAGNOSIS — Z1159 Encounter for screening for other viral diseases: Secondary | ICD-10-CM

## 2021-04-30 DIAGNOSIS — E7800 Pure hypercholesterolemia, unspecified: Secondary | ICD-10-CM

## 2021-04-30 DIAGNOSIS — Z131 Encounter for screening for diabetes mellitus: Secondary | ICD-10-CM

## 2021-04-30 LAB — Comprehensive Metabolic Panel
CREATININE: 0.77 mg/dL (ref 0.60–1.30)
TOTAL CO2: 26 mmol/L (ref 20–30)

## 2021-04-30 LAB — TSH with reflex FT4, FT3: TSH: 1.1 u[IU]/mL (ref 0.3–4.7)

## 2021-04-30 LAB — Lipid Panel: CHOLESTEROL,LDL,CALCULATED: 78 mg/dL (ref >50–<100)

## 2021-05-01 LAB — HBs Ag: HEPATITIS B SURFACE ANTIGEN: NONREACTIVE

## 2021-05-01 LAB — Hgb A1c: HGB A1C - HPLC: 5.3 (ref <5.7)

## 2021-05-16 ENCOUNTER — Ambulatory Visit: Payer: BLUE CROSS/BLUE SHIELD

## 2021-05-19 ENCOUNTER — Telehealth: Payer: Self-pay

## 2021-05-20 MED ORDER — NORGESTIM-ETH ESTRAD TRIPHASIC 0.18/0.215/0.25 MG-35 MCG PO TABS
1 | Freq: Every day | ORAL | 4 refills | 84.00000 days | Status: AC
Start: 2021-05-20 — End: ?

## 2021-07-16 ENCOUNTER — Telehealth: Payer: BLUE CROSS/BLUE SHIELD

## 2021-07-17 MED ORDER — VICKS DAYQUIL COLD & FLU 10-5-325 MG PO CAPS
2 | ORAL | 0 refills | 30.00000 days | Status: AC | PRN
Start: 2021-07-17 — End: ?

## 2021-07-17 NOTE — Telephone Encounter
I called pt left detail message rx was sent to the pharmacy

## 2021-07-17 NOTE — Telephone Encounter
Call Back Request      Reason for call back: Patient called requesting if Dr.Puneky could write a prescription for the medications DayQuil or Nyquil so her [facility] insurance card can cover this medications.  Patient is requesting a call back today.    CB: [phone]    Thank you.     Any Symptoms:  []  Yes  [x]  No       If yes, what symptoms are you experiencing:    o Duration of symptoms (how long):    o Have you taken medication for symptoms (OTC or Rx):      If call was taken outside of clinic hours:    [] Patient or caller has been notified that this message was sent outside of normal clinic hours.     [] Patient or caller has been warm transferred to the physician's answering service. If applicable, patient or caller informed to please call us back if symptoms progress.  Patient or caller has been notified of the turnaround time of 1-2 business day(s).

## 2021-08-10 ENCOUNTER — Ambulatory Visit: Payer: Self-pay

## 2021-08-11 ENCOUNTER — Ambulatory Visit: Payer: Self-pay

## 2021-08-11 DIAGNOSIS — R220 Localized swelling, mass and lump, head: Secondary | ICD-10-CM

## 2021-08-11 DIAGNOSIS — H9313 Tinnitus, bilateral: Secondary | ICD-10-CM

## 2021-08-11 MED ORDER — NYQUIL SEVERE COLD/FLU 5-6.25-10-325 MG PO CAPS
6.25 mg | ORAL_CAPSULE | Freq: Four times a day (QID) | ORAL | 1 refills | 30.00000 days | Status: AC | PRN
Start: 2021-08-11 — End: 2021-08-12

## 2021-08-11 MED ORDER — VICKS DAYQUIL COLD & FLU 10-5-325 MG PO CAPS
2 | ORAL | 0 refills | 30.00000 days | Status: AC | PRN
Start: 2021-08-11 — End: 2021-08-12

## 2021-08-11 MED ORDER — NYQUIL SEVERE COLD/FLU 5-6.25-10-325 MG/15ML PO LIQD
6.25 mg | Freq: Four times a day (QID) | ORAL | 0 refills | Status: AC | PRN
Start: 2021-08-11 — End: ?

## 2021-08-11 NOTE — Progress Notes
[facility]  [address], [address]  [place] [address]  Phone: [phone]  Fax: [phone]    Primary Care Office Visit  Provider/Author: Alexandria Lodge, MD  Patient's Name: Dalaysia Harms  MRN: [id number]  DOB: [birth year]  Visit Date:08/11/2021    Chief Complaint: New Consult (Per pt bump behind Left ear x 4 days ) and Med Management    HPI   Verl Whitmore is a 36 y.o. female presents for New Consult (Per pt bump behind Left ear x 4 days ) and Med Management    Bump behind left ear. States felt like she got bit Friday night. Was not itching, very sensitive. Was not painful.   Still there and sensitive to touch.   No drainage, burning, dizziness.   Ringing in both ears for the last few months.  Denies recent illnesses.     Chronic Problems:  Past Medical History:   Diagnosis Date   ? Chicken pox    ? GERD (gastroesophageal reflux disease)    ? 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 Seasonal allergies        REVIEW OF SYSTEMS     Review of Systems   All other systems reviewed and are negative.     MEDS     Medications that the patient states to be currently taking   Medication Sig   ? norgestimate-ethinyl estradiol (ORTHO TRI-CYCLEN, TRI SPRINTEC) 0.18/0.215/0.25 mg-35 mcg tablet Take 1 tablet by mouth daily.     No Known Allergies    PHYSICAL EXAM      Vitals:    08/11/21 1137   BP: 94/63   Pulse: 89   Resp: 18   Temp: 36.4 ?C (97.6 ?F)   TempSrc: Forehead   SpO2: 97%   Weight: 128 lb (58.1 kg)   Height: 5' 3'' (1.6 m)     Body mass index is 22.67 kg/m?Marland Kitchen     Vital Signs reviewed.  Consitutional: well appearing adult, in no acute distress  ENT: normocephalic, atraumatic, auditory canal and bilateral bulging TM normal w/o erythema and air fluid levels, nasal mucosa moist and without drainage, moist oral mucosa, no oral lesions  Neck: supple, trachea midline, no thyromegaly, nodules or goiter  Respiratory: breathing normally, no distress, lungs clear to auscultation bilaterally, no wheezes, rales or rhonchi  Cardiovascular: regular rate and rhythm, S1/S2 present, no murmur, rubs or gallops  Skin: color normal for stated race. Tender ~-0.5cm subcutaneous mass noted behind the left ear  Neurologic: CN II-XII grossly intact, normal upper extremity motion bilaterally    LABS/STUDIES   I have:   []  Reviewed/ordered []  1 []  2 []  ? 3 unique laboratory, radiology, and/or diagnostic tests noted below    []  Reviewed []  1 []  2 []  ? 3 prior external notes and incorporated into patient assessment    []  Discussed management or test interpretation with external provider(s) as noted        A&P   Cherrise Occhipinti is a a 36 y.o. female presenting for New Consult (Per pt bump behind Left ear x 4 days ) and Med Management      1. Mass of scalp  - soft, tender subcutaneous mass noted behind left ear. Suspect inflamed lymph node vs sebaceous cyst . Will obtain US. Recommended tylenol/advil to decrease inflammation  - US neck soft tissue; Future    2. Tinnitus of both ears  - trial of Flonase. Given  prolonged symptoms, will refer to ENT  - Referral to Surgery, Head & Neck    3. Heat intolerance  - discussed TSH normal. Pt requesting referral to endo for further testing.   - Referral to Endocrinology     Follow Up: In 2 weeks if no improvement in symptoms     The above plan of care including diagnosis, orders, and follow-up were discussed with the patient, who expressed understanding and agreement. The patient was given the opportunity to ask questions which were answered satisfactorily during the visit.    Author:  Alexandria Lodge, MD 08/11/2021 11:58 AM  Clinical Instructor of Medicine   [employer] [place]  Department of Medicine  [facility]    This note was in part dictated. Please excuse any typos as a result.    PROBLEM - Moderate complexity   1 acute illness with systemic symptoms was addressed during this encounter.

## 2021-08-12 ENCOUNTER — Ambulatory Visit: Payer: Self-pay

## 2021-08-12 ENCOUNTER — Non-Acute Institutional Stay: Payer: Self-pay

## 2021-08-12 DIAGNOSIS — Z0182 Encounter for allergy testing: Secondary | ICD-10-CM

## 2021-08-13 ENCOUNTER — Ambulatory Visit: Payer: Self-pay

## 2021-08-14 ENCOUNTER — Telehealth: Payer: Self-pay

## 2021-08-14 NOTE — Addendum Note
Addended by: Elinor Dodge on: 08/14/2021 08:33 AM     Modules accepted: Orders

## 2021-08-15 NOTE — Telephone Encounter
Per pt ENT would like and audiology test before her May appt. Please advise

## 2021-08-18 ENCOUNTER — Non-Acute Institutional Stay: Payer: BLUE CROSS/BLUE SHIELD

## 2021-08-18 DIAGNOSIS — H9313 Tinnitus, bilateral: Secondary | ICD-10-CM

## 2021-08-19 ENCOUNTER — Inpatient Hospital Stay: Payer: Self-pay

## 2021-08-19 DIAGNOSIS — R220 Localized swelling, mass and lump, head: Secondary | ICD-10-CM

## 2021-08-23 ENCOUNTER — Ambulatory Visit: Payer: BLUE CROSS/BLUE SHIELD

## 2021-08-25 ENCOUNTER — Ambulatory Visit: Payer: Self-pay

## 2021-09-24 ENCOUNTER — Telehealth: Payer: BLUE CROSS/BLUE SHIELD

## 2021-09-24 ENCOUNTER — Ambulatory Visit: Payer: Self-pay

## 2021-09-24 NOTE — Consults
TELE-HEALTH VISIT    PATIENT: [name] [name]  MRN: [id number]  DOB: 08/16/1985  DATE OF SERVICE: 09/24/2021    REFERRING PRACTITIONER: Resa Miner., MD  PRIMARY CARE PROVIDER: Resa Miner., MD    Chief Complaint: Fertility preservation consultation      History of Present Illness:  [name] [name] is a 36 y.o. G0  Not currently trying to conceive  Currently on OCPs    Been on OCPs since age 48 due to irregular periods  Unknown of how regular without OCPs      Past GYN History:   History of dysmenorrhea:No.   History of oral contraceptive use: Yes   currently    History of sexually transmitted infections: no  History of abnormal pap smear: no   Last PAP smear: 2020.       Prior Treatments for Infertility:   No     Past Obstetrical History:   G:0      Past Medical History:   Neg     Past Surgical History:   Wisdom teeth extraction     Meds:   OCPs    Allergies:   NKDA      Family History:   Great MGM and MGM: breast cancer  MGM tested negative   Mother: Lupus  No FamHx of mental retardation or slow learners    Social History:   Cigarette use: denied  Alcohol intake: 0-4 drinks per week  Elicit drug use: None  Occupation: Warden/ranger   Ethnic Background: White/Middle-eastern         Review of Systems:  14 point review of systems negative except as per HPI        Objective:     Physical Exam:  There were no vitals taken for this visit.           Assessment / Plan:     Fertility preservation  We thoroughly reviewed the process of egg freezing and the current data on that procedure.  We discussed the available data regarding the success rates to achieve pregnancies from frozen -thawed eggs and the related rates of oocytes retrieved, mature oocytes, oocytes surviving the thaw, fertilized and blastocysts formation. We also discussed the success rate in achieving a livebirth in the future from using these cryopreserved oocytes is based on age and number of oocytes frozen.    Planned oocyte cryopreservation represents a preventive strategy that may enhance the reproductive potential of women and the health of offspring. Although cryopreserving oocytes in this context is not undertaken in response to an immediate disease, it is undertaken with the goal of preventing untreatable infertility in the future.    Discussed egg vs embryo freezing and possible PGT-A option  Reviewed when the optimal time to start     Discussed discontinuing OCPs   Will schedule pelvic ultrasound  Mandated labs and [facility] will be ordered     Will re-group when results are available.    30 minutes were spent personally by me today on this encounter which include today's pre-visit review of the chart, obtaining appropriate history, performing an evaluation, documentation and discussion of management with details supported within the note for today's visit. The time documented was exclusive of any time spent on the separately billed procedure.         Author:     Stefanie Libel. Sharman Cheek, MD   Reproductive Endocrinology and Infertility  09/24/2021 2:31 PM     Patient Consent to Telehealth   The patient agreed to participate  in the video visit prior to joining the visit.

## 2021-09-28 ENCOUNTER — Ambulatory Visit: Payer: Self-pay

## 2021-10-01 ENCOUNTER — Ambulatory Visit: Payer: Self-pay

## 2021-10-01 DIAGNOSIS — I7300 Raynaud's syndrome without gangrene: Secondary | ICD-10-CM

## 2021-10-01 DIAGNOSIS — R61 Generalized hyperhidrosis: Secondary | ICD-10-CM

## 2021-10-01 NOTE — Progress Notes
PATIENTKashawna [name]  MRN: [id number]  DOB: 18-Apr-1986  DATE OF SERVICE: 10/01/2021    REFERRING PRACTITIONER: Alexandria Lodge, MD   PRIMARY CARE PROVIDER: Resa Miner., MD    Reason for Consultation: heat intolerance      Subjective:     History of Present Illness  [name] [name] is a 36 y.o. female. The patient presents for heat intolerance.    She has noticed that she has been more sensitive to heat than everyone around her and has had a couple of instances of night sweats.   This has been going on for many years at least unsure if always has been this way.   The night sweats have been happening recently around 10 times. And this is her major concern.   Maybe some mild weight loss after a breakup (10lbs over 3 months) and then stayed off for a year but then she gained it back.     Has constipation and needs laxative. No tremors no palpitations. Maybe twice a year a little flutter.     She is currently on birth control. It was started for irregular periods at age 31 and has been on it since.     Has noticed some hair thinning. Has been going on for a while but not worse than in the past. She started nioxin shampoo.     Not taking any supplements. Sometimes takes biotin. Last time took this was 6 months ago.       Family history: thyroid disease, breast cancer maternal grandmother and great grandmother    Mother with SLE and father with sjogrens       Past Medical History  Past Medical History:   Diagnosis Date   ? Chicken pox    ? GERD (gastroesophageal reflux disease)    ? 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 Seasonal allergies        Past Surgical History  Past Surgical History:   Procedure Laterality Date   ? WISDOM TOOTH EXTRACTION         Medications  Medications that the patient states to be currently taking   Medication Sig   ? norgestimate-ethinyl estradiol (ORTHO TRI-CYCLEN, TRI SPRINTEC) 0.18/0.215/0.25 mg-35 mcg tablet Take 1 tablet by mouth daily. Allergies  Patient has no known allergies.    Family History  Family History   Problem Relation Age of Onset   ? Lupus Mother    ? Scleroderma Father    ? Arthritis Father    ? Breast cancer Maternal Grandmother 5   ? Gout Maternal Grandfather    ? Stroke Paternal Grandmother    ? Arthritis Paternal Grandmother    ? Skin cancer Paternal Grandfather    ? Stroke Paternal Grandfather    ? Breast cancer Maternal Great-Grandmother        Social History  Social History     Socioeconomic History   ? Marital status: Single   Occupational History   ? Occupation: psychology fellow     Employer: [employer]   Tobacco Use   ? Smoking status: Never   ? Smokeless tobacco: Never   Substance and Sexual Activity   ? Alcohol use: Yes     Alcohol/week: 0.6 oz     Types: 1 Glasses of Wine (5 oz) per week     Comment: social    ? Drug use: No   ? Sexual activity: Yes     Partners: Male  Birth control/protection: Oral Contraceptive Pill   Other Topics Concern   ? Do you exercise at least a day, 3 or more days a week? Yes   ? Do you follow a special diet? No   ? Vegan? No   ? Vegetarian? No   ? Pescatarian? No   ? Lactose Free? No   ? Gluten Free? No   ? Omnivore? No   Social History Narrative    Diet: regular diet. meat rarely. Fast food, none. Soda, none. +veggies +fruits.    Exercise: hiking. 3+ days per week. On average 30-180 minutes    Alcohol: once weekly, 2 drinks on average.    Occupation: psychologist at [employer] [place]    Sleep: ''hit or miss''       Review of Systems:  10 point ROS negative except per HPI    Objective:     Last Recorded Vital Signs:    10/01/21 1307   BP: 108/53   Pulse: 90   Resp: 18   Temp: 36.4 ?C (97.5 ?F)   SpO2: 99%     Wt Readings from Last 3 Encounters:   10/01/21 128 lb 3.2 oz (58.2 kg)   08/11/21 128 lb (58.1 kg)   04/27/21 127 lb 6.4 oz (57.8 kg)     Body mass index is 22.71 kg/m?Marland Kitchen      System Check if normal Positive or additional negative findings   Constit  [x]  General appearance Eyes  [x]  Conj/Lids []  Pupils  []  Fundi     ENMT  [x]  gross Hearing    []  Lips/teeth/gums    []  mucus membranes     Neck  [x]  Inspection/palpation [x]  Normal Thyroid  small periauricular lymph node   Resp  [x]  Normal effort []  No Wheezing    []  Auscultation  [] No Crackles     CV  [x]  Rhythm/rate   []  Murmurs   []  LEE   Normal pulses:   []  radial []  Femoral  []  Pedal     Breast  []  Inspection []  Palpation     GI  []  abd masses    []  tenderness   []  rebound/guarding   []  Liver/spleen      GU  M: []  Scrotum []  Penis []  Prostate   F:  []  External []  vaginal wall      Lymph  []  Neck []  Axillae []  Groin     MSK Specify site examined:    []  Inspect/palp []  ROM   []  Stability []  Strength/tone         Skin  [x]  Inspection []  Palpation       Neuro  []  CN2-12 intact grossly   [x]  Alert and oriented   []  Patellar DTR      []  Sensation   [x]  Gait/balance     Psych  [x]  Insight/judgement     [x]  Mood/affect    [x]  Gross cognition            Labs:    Lab Results   Component Value Date    CREAT 0.77 04/30/2021    BUN 12 04/30/2021    NA 139 04/30/2021    K 4.0 04/30/2021    CL 102 04/30/2021    CO2 26 04/30/2021     Lab Results   Component Value Date    WBC 8.54 04/28/2018    HGB 14.1 04/28/2018    HCT 43.5 04/28/2018    MCV 88.4 04/28/2018    PLT 291 04/28/2018  Lab Results   Component Value Date    TSH 1.1 04/30/2021       No results found for: TPOAB, THYGLOBAB    No results found for: TPOAB, THYGLOBAB, THYSTIMIG      No results found for: Ancil Linsey      Lab Results   Component Value Date    HGBA1C 5.3 04/30/2021       Lab Results   Component Value Date    CHOL 171 04/30/2021    CHOLHDL 71 04/30/2021    TRIGLY 108 04/30/2021       Hgb A1c - HPLC   Date Value Ref Range Status   04/30/2021 5.3 <5.7 % Final     Comment:     For patients with diabetes, an A1c less than (<) or equal (=) to 7.0% is recommended for most patients, however the goal may be higher or lower depending on age and/or other medical problems. For a diagnosis of diabetes, A1c greater than (>) or equal(=) to 6.5% indicates diabetes; values between 5.7% and 6.4% may indicate an increased risk of developing diabetes.   04/23/2020 5.5 <5.7 % Final     Comment:     For patients with diabetes, an A1c less than (<) or equal (=) to 7.0% is recommended for most patients, however the goal may be higher or lower depending on age and/or other medical problems.   For a diagnosis of diabetes, A1c greater than (>) or equal(=) to 6.5% indicates diabetes; values between 5.7% and 6.4% may indicate an increased risk of developing diabetes.   04/30/2019 5.4 <5.7 % Final     Comment:     For patients with diabetes, an A1c less than (<) or equal (=) to 7.0% is recommended for most patients, however the goal may be higher or lower depending on age and/or other medical problems.   For a diagnosis of diabetes, A1c greater than (>) or equal(=) to 6.5% indicates diabetes; values between 5.7% and 6.4% may indicate an increased risk of developing diabetes.       No results found for: ACTH, CORTISOL    No results for input(s): ACTH, CORTISOL in the last 72 hours.    Lab Results   Component Value Date    CALCIUM 9.3 04/30/2021    CREAT 0.77 04/30/2021    ALBUMIN 4.0 04/30/2021    TSH 1.1 04/30/2021       No results for input(s): CALCIUM, ICALCOR in the last 72 hours.  Lab Results   Component Value Date    TSH 1.1 04/30/2021       Imaging:          Assessment and Plan:      #Night sweats/heat intolerance: normal TSH but will check full thyroid panel. Would not be a sign of early menopause since she is on birth control so if any ovarian hormone issue this would not cause night sweats because birth control would be treating this. She can check ovarian hormones once off birth control. Will check rare endo causes like metanephrines and calcitonin.   - Check labs   - If normal then loop back with PCP     #Raynauds: patient with strong family history of autoimmune disorder and Raynauds   - Evaluation with rheum also to see if night sweats might be related to this.     #Hair loss:   - Check hair loss labs         Patient Instructions   -  From May to July Dr Loetta Rough will be covering for me. In August Dr Carleene Overlie and Dr Lady Gary will be taking my place. I leave you in great hands.             The above recommendations were discussed with the patient.  She has had all questions answered satisfactorily and understands the recommended plan of care.    I spent 45 minutes counseling and coordinating care for the items checked below on the day of service  10/01/2021   [x]  Preparing to see the patient (e.g., review of tests)  [x]  Obtaining and or reviewing separately obtained history   [x]  Performing a medically appropriate examination and/or evaluation   [x]  Counseling and educating the patient/family/caregiver   [x]  Ordering medications, tests, or procedures  [x]  Referring and communicating with other healthcare professionals (when not separately reported)  [x]  Documenting clinical information in the EHR  [x]  Independently interpreting results and communicating results to patient/ family/caregiver      The above recommendation were discussed with the patient. The patient has all questions answered satisfactorily and is in agreement with this recommended plan of care.         Author:  Loann Quill 10/01/2021 1:47 PM

## 2021-10-01 NOTE — Patient Instructions
-   From May to July Dr Lei will be covering for me. In August Dr Sumal and Dr Cannon will be taking my place. I leave you in great hands.

## 2021-10-04 DIAGNOSIS — Z3162 Encounter for fertility preservation counseling: Secondary | ICD-10-CM

## 2021-10-06 ENCOUNTER — Institutional Professional Consult (permissible substitution): Payer: Self-pay

## 2021-10-06 DIAGNOSIS — R61 Generalized hyperhidrosis: Secondary | ICD-10-CM

## 2021-10-06 LAB — CBC: PLATELET COUNT, AUTO: 277 10*3/uL (ref 143–398)

## 2021-10-06 LAB — Hgb A1c: HGB A1C - HPLC: 5.4 (ref <5.7)

## 2021-10-06 LAB — Differential Automated: LYMPHOCYTE PERCENT, AUTO: 50 (ref 1.30–3.40)

## 2021-10-06 LAB — Free T3: FREE T3: 310 pg/dL (ref 222–383)

## 2021-10-06 LAB — Free T4: FREE T4: 1.5 ng/dL (ref 0.80–1.70)

## 2021-10-06 LAB — Ferritin: FERRITIN: 26 ng/mL (ref 8–180)

## 2021-10-06 LAB — Iron & Iron Binding Capacity: % SATURATION: 37 (ref 262–502)

## 2021-10-06 LAB — TSH: TSH: 1.6 u[IU]/mL (ref 0.3–4.7)

## 2021-10-07 LAB — DHEA-Sulfate: DHEA-SULFATE: 1330 ng/mL

## 2021-10-08 LAB — Calcitonin: CALCITONIN: <2 pg/mL (ref 0.0–5.1)

## 2021-10-08 LAB — Zinc, Plasma: ZINC, SERUM OR PLASMA: 79.2 ug/dL (ref 60.0–120.0)

## 2021-10-09 LAB — Vitamin A (Retinol), Serum or Plasma: VITAMIN A, SER/PLA - INTERPRETATION: NORMAL mg/L (ref 0.00–0.10)

## 2021-10-12 LAB — Metanephrines, Fract, Free, Pl: METANEPHRINE, FREE PLASMA: 0.14 nmol/L (ref 0.00–0.49)

## 2021-10-12 NOTE — Consults
Adult Audiologic Evaluation    [name] [name] is a  36 y.o. who was seen for an Audiological evaluation.      Pertinent History:  ? Tinnitus and fullness in the right ear since Nov 2022  ? Noise exposure:  Concerts/music   ? Pain report: None  ? Referred by Dr. Alanson Aly to Audiology and ENT:  Tinnitus of both ears  ? Future appt with Dr. Cherly Hensen    Objective Results:  Otoscopy    Right ear: Clear ear canal with normal tympanic membrane appearance     Left ear:   Clear ear canal with normal tympanic membrane appearance    Tympanometry    Right ear: Normal ear canal volume, peak pressure and tympanic membrane compliance, consistent with normal middle ear function    Left ear:   Normal ear canal volume, peak pressure and tympanic membrane compliance, consistent with normal middle ear function    Acoustic reflexes:  Asymmetric R>L    Right ear: Present in the ipsilateral condition at tested frequencies.    Left ear:   Present in the ipsilateral condition at tested frequencies.    Word recognition     Right ear: Unimpaired at an adequate intensity    Left ear:   Unimpaired at an adequate intensity    Audiological Diagnosis:  ? Normal hearing sensitivity in both ears.  ? Hearing is adequate for typical communication.    Recommendations:  ? Results reviewed with patient  ? Medical-follow up with ENT/ otologist, as scheduled.   ? patient was counseled regarding test results  ? Noise - avoid exposure to intense noise as possible. Use hearing protection devices in all potentially noise-hazardous situations.  ? Hearing evaluation every year or as needed.  ? A copy of this report is available via [email or web address]  or ''MyChart'' mobile app.       Ailis Rigaud, AuD, 10/12/2021       Education:  Topic: Hearing evaluation  Person Taught: patient  Barrier: None  Needs: Knows well  Teaching Method: Verbal instruction    Outcome: Able to repeat information        For further information, please see Audiological Assessment (located under the Notes tab of Chart Review for [facility] providers).

## 2021-10-14 LAB — Testosterone, Bioavailable and Total, Includes Sex Hormone-Binding Globulin (Adult Females, Children, or Individuals on Testosterone-Suppressing Hormone Therapy): TESTOSTERONE, FREE LC-MS/MS: 1.5 pg/mL (ref 1.3–9.2)

## 2021-10-19 ENCOUNTER — Ambulatory Visit: Payer: Self-pay | Attending: Rheumatology

## 2021-10-19 DIAGNOSIS — I7300 Raynaud's syndrome without gangrene: Secondary | ICD-10-CM
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
## 2021-10-22 ENCOUNTER — Telehealth: Payer: Self-pay

## 2021-10-22 ENCOUNTER — Ambulatory Visit: Payer: Self-pay | Attending: Audiologist

## 2021-10-22 DIAGNOSIS — H9311 Tinnitus, right ear: Secondary | ICD-10-CM

## 2021-10-22 DIAGNOSIS — H938X1 Other specified disorders of right ear: Secondary | ICD-10-CM

## 2021-10-22 DIAGNOSIS — Z0110 Encounter for examination of ears and hearing without abnormal findings: Secondary | ICD-10-CM

## 2021-10-22 NOTE — Patient Instructions
Accessing Audiology notes online:    The report is viewable in [facility] ''MyChart'' under clinical notes    1. Hover your mouse over the ''Visits'' icon.   2. Click on the ''Appointments and Visits'' link.   3. Click on the desired visit.   4. Click on the ''Clinical Notes'' tab located below the ''Appointment Details'' header.   5. Listed are the types of Ambulatory Notes you may see:   Progress Notes   Consult   Procedure   H&P   Goals of Care   Interdisciplinary Notes     **All of Audiology visit notes (Reports) are considered ''Consult Notes''.     Please call [phone] for additional help if you still cannot see notes.         A copy of the audiogram/reports can be obtained by contacting Medical Records:    Phone: [phone] [facility] Medical Records    In person during the hours of 8:00 AM-4:30 PM, Monday-Friday    [facility]- [address] [place], [place] [address]    [facility]- [address], [address], [place] [address]          Brayton Mars acceder a sus informes de Audiolog?a en l?nea:      El informe se puede ver en [facility] ''MyChart''. ?Se encuentra bajo notas cl?nicas.      Para acceder al informe:   1.  Pose el cursor sobre el ?icono de ?Visits.? (consultas)  2.  Presione el enlace de ?Appointments and Visits? (citas y consultas)  3.  Haga clic en la consulta deseada  4.  Haga clic en la pesta?a ''Clinical notes? (notas cl?nicas), situado debajo del rubro ''Appontment  Details'' (detalles de la cita)  5.  A continuaci?n, se enumeran los tipos de notas ambulatorias que podr? ver:   Progress Notes (notas evolutivas)  Consult (interconsulta)   Procedure (procedimiento)    H&P (historial m?dico y un examen f?sico)   Goals of Care (metas de tratamiento)   Interdisciplinary Notes (notas interdisciplinarias)    **Todas las notas de Youth worker en Audiolog?a (Reports) se consideran ?Consult notes? (notas de interconsulta)     Por favor llame al [phone] para obtener ayuda adicional si todav?a no puede ver las notas.        Una copia del audiograma/informes se pueden obtener al comunicarse con el archivo de historia cl?nica     Llame por Tel?fono al: [phone], [facility] Medical Records (archivo de historia cl?nica en [place])     Atenci?n disponible en persona durante las horas entre 8:00 AM-4:30 PM, de lunes a viernes     [facility]- [address], [address] [place], [place] [address]     [facility]- [address], [address], [place] [address]

## 2021-10-22 NOTE — Telephone Encounter
lvm , Pt. Needs to schedule an return visit after labs are done.

## 2021-10-27 ENCOUNTER — Inpatient Hospital Stay: Payer: Self-pay

## 2021-10-27 DIAGNOSIS — Z1231 Encounter for screening mammogram for malignant neoplasm of breast: Secondary | ICD-10-CM

## 2021-10-27 NOTE — Consults
[facility] - [facility]  [address], [address]  [place], [place] [address]    HEAD & NECK SURGERY CONSULTATION NOTE  Patient Name: [name] [name]  MRN: [id number]  Date of Birth: 1985-09-28    Date of Consult: 10/28/2021   Referring Physician (Service): Unknown   Primary Care Physician: Resa Miner., MD    Reason for Consult: eval ears    I had the pleasure of seeing [name] today in Head & Neck Surgical consultation.    History of Present Illness   [name] [name] is a 36 y.o. female referred for evaluation of ears.    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  Has a hard time clearing pressure in the ears on flights - uses a saline spray and pressurizing   Has had issues clearing pressure in the ears with colds, has lingered ~3 days.  No known clenching/grinding     Denies hx ear infections, hearing loss, otalgia, otorrhea, vertigo. + hx noise trauma, concerts.  Audiogram 10/22/2021 unremarkable    In Dec/Jan - was intermittently sick with nasal congestion and rhinorrhea.  Infrequent nasal congestion. Frequent rhinorrhea, clear; nose runs with spicy foods. Did have PND a few weeks ago. Denies prior hx sinus infections, no current facial pain and pressure. Denies yellowish greenish rhinorrhea. Sense of smell OK.    ++ environmental allergies, s/p allergy shots as a child, dust mites, grasses, pollen  Seeing allergist in 1-2 weeks ago.     Has never tried nasal saline irrigations, nasal sprays, antihistamine.     Medical & Surgical History   Past Medical History:  Past Medical History:   Diagnosis Date   ? Chicken pox    ? GERD (gastroesophageal reflux disease)    ? 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 Seasonal allergies       Patient Active Problem List   Diagnosis   ? Raynaud phenomenon   ? Globus sensation   ? Heat intolerance   ? Lumbar disc disease with radiculopathy   ? Encounter for female birth control   ? Hyperlipidemia       Past Surgical History:  Past Surgical History:   Procedure Laterality Date   ? WISDOM TOOTH EXTRACTION          Family History:  Family History   Problem Relation Age of Onset   ? Lupus Mother    ? Scleroderma Father    ? Arthritis Father    ? Breast cancer Maternal Grandmother 40   ? Gout Maternal Grandfather    ? Stroke Paternal Grandmother    ? Arthritis Paternal Grandmother    ? Skin cancer Paternal Grandfather    ? Stroke Paternal Grandfather    ? Breast cancer Maternal Great-Grandmother        Social History:  Social History  Tobacco Use   ? Smoking status: Never   ? Smokeless tobacco: Never   Substance Use Topics   ? Alcohol use: Yes     Alcohol/week: 0.6 oz     Types: 1 Glasses of Wine (5 oz) per week     Comment: social    ? Drug use: No       Medications & Allergies   Outpatient Medications:  No outpatient medications prior to visit.     No facility-administered medications prior to visit.        Allergies:   No Known Allergies     Review of Systems   The patient was asked and responded to a 14 point review of systems regarding constitutional symptoms, eye symptoms, ears, nose, mouth, throat symptoms, cardiovascular symptoms, respiratory symptoms, gastrointestinal symptoms, genitourinary symptoms, musculoskeletal symptoms, integumentary symptoms, neurological symptoms, psychiatric symptoms, endocrine symptoms, hematologic/lymphatic symptoms, and allergic/ immunologic symptoms.     The written questionnaire was reviewed, pertinent factors have been included in the HPI, and the complete list can be found in the patient's electronic chart.    Pertinent items are otherwise noted in the HPI above.    Physical Exam   Nad, voice clear, breathing easily no stridor or stertor  EOMI, no nystagmus  Mild bilateral TMJ pressure to palpation (''feel bruised bilaterally'')  Septum intact, 2+ ITH. No purulence, masses or polyps visualized  MMM, 1+ tonsils, uvula midline  AU EAC clear, Tmi  Neck no palpable cervical lad bilaterally, no thyromegaly or palpable nodules    Results Review   Audiogram: 10/22/2021 unremarkable      Assessment & Plan   Demitria Hay is a 36 y.o. female with the following:       Recommendations:  #. Right ear pressure, tinnitus  # Allergic and nonallergic/vasomotor/gustatory rhinitis  # ETD  - Audiogram: 10/22/2021 unremarkable  - annual audiogram  - avoid excessive noise, quiet situations  - discussed multiple modalities of possible treatments - but no proven cure  - consider trial of maskers, distraction techniques, sound therapies  - consider CBT if bothersome tinnitus  - START nasal saline irrigations daily  - Trial of flonase, astelin, atrovent  - Trial of antihistamine PRN - e.g. claritin, zyrtec, allegra   - may use afrin prior to flights  - Avoid gum chewing and chewy foods; soft diet  - May try warm compresses to the jaw (may also try cold compresses to minimize any swelling)  - If clenching during the day - may try putting tongue on the roof of your mouth to minimize this  - May try Dolowitz exercises (may Google this online)  - Over the counter NSAIDs, e.g. motrin/ibuprofen, aleve  - please consider nightguard to help with clenching/grinding at night - talk to your dentist about this, or may see TMJ specialist    Return:  - PRN, if symptoms persist despite above    Thank you for the kind referral and for allowing me to share in the care of [name] [name]. If you have any questions, please do not hesitate to contact me.      Sincerely,    Francisca December. Cherly Hensen, MD PhD  Physician and Surgeon - Otolaryngology  10/28/2021 10:19 AM    [facility] - [facility]  [address], [address]  [place], [place] [address]  Tel: [phone] ~ Fax:  [phone]

## 2021-10-28 ENCOUNTER — Ambulatory Visit: Payer: Self-pay

## 2021-10-28 DIAGNOSIS — H938X1 Other specified disorders of right ear: Secondary | ICD-10-CM

## 2021-10-28 DIAGNOSIS — H6980 Other specified disorders of Eustachian tube, unspecified ear: Secondary | ICD-10-CM

## 2021-10-28 DIAGNOSIS — H9319 Tinnitus, unspecified ear: Secondary | ICD-10-CM

## 2021-10-28 DIAGNOSIS — J310 Chronic rhinitis: Secondary | ICD-10-CM

## 2021-10-28 DIAGNOSIS — J309 Allergic rhinitis, unspecified: Secondary | ICD-10-CM

## 2021-10-28 MED ORDER — IPRATROPIUM BROMIDE 0.03 % NA SOLN
2 | Freq: Two times a day (BID) | NASAL | 5 refills | Status: AC | PRN
Start: 2021-10-28 — End: ?

## 2021-10-28 MED ORDER — FLUTICASONE PROPIONATE 50 MCG/ACT NA SUSP
1 | Freq: Two times a day (BID) | NASAL | 3 refills | Status: AC
Start: 2021-10-28 — End: ?

## 2021-10-28 MED ORDER — AZELASTINE HCL 0.1 % NA SOLN
1 | Freq: Two times a day (BID) | NASAL | 5 refills | Status: AC | PRN
Start: 2021-10-28 — End: ?

## 2021-10-28 NOTE — Patient Instructions
-   Please use nasal saline irrigations at least once daily, or more as needed  - You can use a [employer]Med sinus rinse bottle or any sinus rinse/ Neti Pot product available  - [employer] has good instructional videos on their website on how to use sinus irrigations properly: http://blanchard.com/.php  - Use distilled water, or tap water only if it has been boiled and cooled prior to use.  - If you would like to make your own saline (salt water) solution, here is a recipe:   - 1 quart boiled or distilled water (please cool to room temperature before using, if boiled)   - 1 teaspoon canning/pickling/kosher salt (non-iodized)   - 1 teaspoon baking soda  - It may be convenient to mix larger quantities of the saline solution and store it in your refrigerator, warming up each days supply prior to use.  - Consider buying one gallon of distilled water and adding 4 tsp of salt and 4 tsp of baking soda.      - Please use nasal sprays as follows:  - When using nasal spray, keep the head upright. Insert the nozzle into the nose and direct it towards the temple - away from the middle (septum) of the nose. Inhale gently.   - If you are tasting a lot of the medication in the back of the throat, you may be sniffing in a little bit too hard. The medication should land in the front part of the nose for maximal efficacy.  - Please use nasal sprays AFTER nasal saline irrigations (to avoid irrigating out medication from the nose)    Please use fluticasone 50 mcg/act nasal spray (brand name: Flonase) - 2 sprays/nostril/day. This medication works best if you use it consistently, as it may take 4-6 weeks for this to take full effect in the nose. This medication is available over the counter, so if not cheaper through your insurance, please purchase over the counter.   Please use azelastine (aka astelin, astepro) - 2 sprays/nostril 1-2x/day as needed for nasal allergies.  This medication is available over the counter, so if not cheaper through your insurance, please purchase over the counter.   Please use ipratropium (aka atrovent) - 2 sprays/nostril - up to 4x/day as needed for runny nose, e.g. may use before meals if nose runs with meals    - May take antihistamine (e.g. zyrtec, claritin, allegra) AS NEEDED for allergies. These are available over the counter. Please follow the directions on the back of the package for instructions.      - Avoid gum chewing and chewy foods; soft diet  - May try warm compresses to the jaw (may also try cold compresses to minimize any swelling)  - If clenching during the day - may try putting tongue on the roof of your mouth to minimize this  - May try Dolowitz exercises (may Google this online)  - Over the counter NSAIDs, e.g. motrin/ibuprofen, aleve  - please consider nightguard to help with clenching/grinding at night - talk to your dentist about this, or may see TMJ specialist

## 2021-11-13 ENCOUNTER — Ambulatory Visit: Payer: Self-pay

## 2021-11-13 NOTE — Progress Notes
Chief Complaint: allergies    REFERRING PRACTITIONER: Alexandria Lodge, MD  PRIMARY CARE PROVIDER: Waverly Ferrari. Dub Mikes, MD    [name] [name] is a 36 y.o. female who complains of     Nasal congestion, runny nose, sneezing, occ PND, right ear fullness, slight frontal sinus pressure  Right ear fullness, sneezing are most bothersome symptoms  History of IT as a child    A few months ago she had concern for enlarged lymph node on left, US showed benign finding    Rhinitis:Yes  Sinusitis: No  Conjunctivitis: No  Asthma: No  Eczema: No  Food allergies: No  Drug allergies: No  Venom allergy: No    Environmental History:  Residence: [place]  Bedroom floor: hardwood floor  Mattress/Pillow: none  Pets: none  Occasional exposures: windy days, strong smells  Occupational exposures: psychologist      Past Medical History:  Past Medical History:   Diagnosis Date   ? Chicken pox    ? GERD (gastroesophageal reflux disease)    ? 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 Seasonal allergies          Past Surgical History:  Past Surgical History:   Procedure Laterality Date   ? WISDOM TOOTH EXTRACTION           Allergies:  No Known Allergies      Medications:  Medications that the patient states to be currently taking   Medication Sig   ? azelastine 137 mcg/spray nasal spray Spray 1 spray by nasal route two (2) times daily as needed for Rhinitis.   ? fluticasone 50 mcg/act nasal spray Spray 1 spray by nasal route two (2) times daily.   ? ipratropium 0.03% nasal spray Spray 2 sprays in each nare two (2) times daily as needed for Rhinitis.         Family History:  Family History   Problem Relation Age of Onset   ? Lupus Mother    ? Scleroderma Father    ? Arthritis Father    ? Breast cancer Maternal Grandmother 55   ? Gout Maternal Grandfather    ? Stroke Paternal Grandmother    ? Arthritis Paternal Grandmother    ? Skin cancer Paternal Grandfather    ? Stroke Paternal Grandfather    ? Breast cancer Maternal Great-Grandmother          Social History:  Social History     Socioeconomic History   ? Marital status: Single   Occupational History   ? Occupation: psychology fellow     Employer: [employer]   Tobacco Use   ? Smoking status: Never   ? Smokeless tobacco: Never   Substance and Sexual Activity   ? Alcohol use: Yes     Alcohol/week: 0.6 oz     Types: 1 Glasses of Wine (5 oz) per week     Comment: social    ? Drug use: No   ? Sexual activity: Yes     Partners: Male     Birth control/protection: Oral Contraceptive Pill   Other Topics Concern   ? Do you exercise at least a day, 3 or more days a week? Yes   ? Do you follow a special diet? No   ? Vegan? No   ? Vegetarian? No   ? Pescatarian? No   ? Lactose Free? No   ? Gluten Free? No   ? Omnivore? No   Social History Narrative  Diet: regular diet. meat rarely. Fast food, none. Soda, none. +veggies +fruits.    Exercise: hiking. 3+ days per week. On average 30-180 minutes    Alcohol: once weekly, 2 drinks on average.    Occupation: psychologist at [employer]    Sleep: ''hit or miss''     Review Of Systems:  14 system review performed; all systems negative, except as documented above.    BP 106/70  ~ Pulse 88  ~ Temp 36.4 ?C (97.6 ?F) (Forehead)  ~ Ht 5' 3'' (1.6 m)  ~ Wt 125 lb (56.7 kg)  ~ SpO2 97%  ~ BMI 22.14 kg/m? ; Body mass index is 22.14 kg/m?Marland Kitchen  Physical exam:  Gen - in no acute distress, well nourished, well developed  Eyes - no conjunctivitis or exudate  Ears - TM clear with no effusion or bulging  Nose - nasal mucosa normal, no congestion, no bleeding, no nasal flaring  Mouth/Throat - moist, no tonsillar enlargement or exudate, no sores  Cardiovascular - regular rate and rhythm, no murmur, rubs, or gallops  Respiratory - clear to ausculation bilaterally  Skin - no rashes    Lab & Diagnostic Studies:  Lab Results   Component Value Date    WBC 5.72 10/06/2021    HGB 13.6 10/06/2021    HCT 41.9 10/06/2021    MCV 89.1 10/06/2021    PLT 277 10/06/2021     Lab Results   Component Value Date    CREAT 0.77 04/30/2021    BUN 12 04/30/2021    NA 139 04/30/2021    K 4.0 04/30/2021    CL 102 04/30/2021    CO2 26 04/30/2021    ALT 11 04/30/2021    AST 20 04/30/2021    ALKPHOS 42 04/30/2021    BILITOT 0.3 04/30/2021    ALBUMIN 4.0 04/30/2021       Assessment: [name] [name] is a 36 y.o. female who presents with    1. Chronic rhinitis: nasal congestion, runny nose, sneezing, occ PND, right ear fullness, slight frontal sinus pressure. Right ear fullness, sneezing are most bothersome symptoms. History of IT as a child  - 40 panel environmental skin test  - Continue azelastine PRN  - Start flonase 2 spray/nostril, reviewed proper technique  - Start ipratropium PRN  - Prior to flights, afrin and sudafed morning of flight    Plan:  40 panel environmental skin test    Patient is in agreement with plan of care.  I have answered all questions satisfactorily.    Return to clinic for skin testing or sooner with any questions or concerns.     Thank you Alexandria Lodge, MD for your consultation, my recommendations are above. Please call with any questions.     Debara Pickett, MD  Allergy/Immunology

## 2021-11-13 NOTE — Patient Instructions
Skin test - do not take any antihistamines for 5-7 days before.   Examples of antihistamines:  a) Most allergy medications such as benadryl, allegra, claritin, zyrtec, certain nasal sprays such as azelastine or olopatadine (Flonase is okay). Allergy eye drops (azelastine, olopatadine) should also be held  b) Some antacids including pepcid (famotidine), zantac (ranitidine), tagamet (cimetidine)  c) Most over the counter sleep medications including Tylenol PM, Advil PM, Unisom  d) Most over the counter cold medications including Nyquil, Theraflu, Tylenol Cold & Sinus    1. Azelastine 1-2 spray/nostril twice a day. Helps with runny nose, sneezing, itching, and to a small degree with congestion. May have bitter/metallic taste as side effect    2. Fluticasone (Flonase) 2 sprays in each nostril once a day. Helps with nasal congestion, runny nose, sneezing, itching, post-nasal drip. Takes a few days to take effect, maximal benefit after 4-6 weeks of consistent use.  Sit down, look down towards the ground.  Insert a tiny tip of the spray (about 1-2 mm) and angle/tilt the spray towards the outside (towards the ear, away from the middle of nose).  After each spray, take a very gentle sniff so that the medicine will coat the middle/upper part of the nostril but don't sniff too hard that you end up tasting it.  It's normal to taste the medicine after a few minutes but if you taste it immediately, you sniffed too hard (not harmful, just means you are wasting the medication).    3. Ipratropium (Atrovent) 1-2 spray in each nostril up to 4 times a day as needed for runny nose or post-nasal drip.  Will not help with nasal congestion or dry nose. Takes effect within 15-20 minutes    Plan:  - Allergy skin test, hold antihistamine (azelastine) for 5-7 days before. OK to use flonase and ipratropium

## 2021-11-17 DIAGNOSIS — R0989 Other specified symptoms and signs involving the circulatory and respiratory systems: Secondary | ICD-10-CM

## 2021-11-17 DIAGNOSIS — R0981 Nasal congestion: Secondary | ICD-10-CM

## 2021-11-17 DIAGNOSIS — R067 Sneezing: Secondary | ICD-10-CM

## 2021-11-17 DIAGNOSIS — R0982 Postnasal drip: Secondary | ICD-10-CM

## 2021-11-17 DIAGNOSIS — J310 Chronic rhinitis: Secondary | ICD-10-CM

## 2022-01-07 ENCOUNTER — Ambulatory Visit: Payer: Self-pay

## 2022-01-07 DIAGNOSIS — R300 Dysuria: Secondary | ICD-10-CM

## 2022-01-07 DIAGNOSIS — N390 Urinary tract infection, site not specified: Secondary | ICD-10-CM

## 2022-01-07 DIAGNOSIS — S3140XA Unspecified open wound of vagina and vulva, initial encounter: Secondary | ICD-10-CM

## 2022-01-07 DIAGNOSIS — R350 Frequency of micturition: Secondary | ICD-10-CM

## 2022-01-07 DIAGNOSIS — R102 Pelvic and perineal pain: Secondary | ICD-10-CM

## 2022-01-08 ENCOUNTER — Ambulatory Visit: Payer: Self-pay

## 2022-01-08 ENCOUNTER — Telehealth: Payer: Self-pay

## 2022-01-08 ENCOUNTER — Ambulatory Visit: Payer: BLUE CROSS/BLUE SHIELD

## 2022-01-08 LAB — Bacterial Vaginosis Screen

## 2022-01-08 LAB — HSV Type 1 and 2 by PCR: HSV TYPE 2 DNA: NOT DETECTED

## 2022-01-08 MED ORDER — PRAMOXINE-ZINC OXIDE-CALAMINE 1-0.4-0.4 % EX CREA
0 refills | Status: AC
Start: 2022-01-08 — End: 2022-01-15

## 2022-01-08 MED ORDER — SULFAMETHOXAZOLE-TRIMETHOPRIM DOUBLE STRENGTH 800-160 MG PO TABS
1 | ORAL_TABLET | Freq: Two times a day (BID) | ORAL | 0 refills | Status: AC
Start: 2022-01-08 — End: 2022-01-08

## 2022-01-08 MED ORDER — SULFAMETHOXAZOLE-TRIMETHOPRIM DOUBLE STRENGTH 800-160 MG PO TABS
1 | ORAL_TABLET | Freq: Two times a day (BID) | ORAL | 0 refills | Status: AC
Start: 2022-01-08 — End: 2022-01-15

## 2022-01-08 MED ORDER — PRAMOXINE-ZINC OXIDE-CALAMINE 1-0.4-0.4 % EX CREA
0 refills | Status: AC
Start: 2022-01-08 — End: 2022-01-08

## 2022-01-08 NOTE — Progress Notes
HPI: [name] [name] is a 36 y.o. 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 here  for complain of pain in the vaginal region x1 week, started after having vigorous intercourse.  Also complain of urinary symptoms with dysuria and frequency.  Patient is sexually active with only 1 partner started about 4 months ago.  No fever chills nausea vomiting abdominal pain chest pain shortness of breath dizziness or lightheadedness.  Patient also noticed mild increasing discharge from vaginal area.  No vaginal bleeding.    No symptoms except for the above.    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    Past Surgical History:  She has a past surgical history that includes Wisdom tooth extraction.    Medication and Supplements:  Outpatient Medications Prior to Visit   Medication Sig   ? azelastine 137 mcg/spray nasal spray Spray 1 spray by nasal route two (2) times daily as needed for Rhinitis.   ? fluticasone 50 mcg/act nasal spray Spray 1 spray by nasal route two (2) times daily.   ? ipratropium 0.03% nasal spray Spray 2 sprays in each nare two (2) times daily as needed for Rhinitis.     No facility-administered medications prior to visit.       Allergies:  No Known Allergies    Social History:  She reports that she has never smoked. She has never used smokeless tobacco. She reports current alcohol use of about 0.6 oz of alcohol per week. She reports that she does not use drugs.    Review of Symptoms:  ROS Negative except for the above.    Objective:     Physical Exam  BP 111/76  ~ Pulse 80  ~ Temp 36.9 ?C (98.4 ?F) (Tympanic)  ~ SpO2 100%     General: A & O x 3, No acute distress, not ill-appearing, toxic-appearing or diaphoretic  Head: AT, normocephalic  Eyes: EOM intact,  Non icteric,   No conjunctival erythema   Ears:  Hearing grossly normal bilaterally   Mouth/Throat: Tonsils:    No exudates. No oral ulcer  Sinus: Not TTP   Neck: supple, trachea appears midline.   Heart: RRR S1 + S2 +0   Lung: CTAB, good air movement, No rhonchi, No wheezing  Skin: normal coloration and turgor, no rashes, no suspicious skin lesions noted. No jaundice   Small open wound resembling a tear in the posterior vaginal mucosa in the midline c TTP. Mild vaginal discharge, No odor,    Normal cervix without lesions, polyps or tenderness,  uterus mobile, consistency, no mass or tenderness. No CMT, no adnexal masses.    Neuro: CN II through XII grossly intact, No focal findings or movement disorder noted      Assessment/Plan     1. Dysuria  POCT urinalysis dipstick (Ambulatory Protocol - Point of Care Urinalysis Dipstick)    POCT urinalysis dipstick (Ambulatory Protocol - Point of Care Urinalysis Dipstick)      2. Frequent urination  POCT urinalysis dipstick (Ambulatory Protocol - Point of Care Urinalysis Dipstick)    POCT urinalysis dipstick (Ambulatory Protocol - Point of Care Urinalysis Dipstick)         1. Dysuria  - POCT urinalysis dipstick (Ambulatory Protocol - Point of  Care Urinalysis Dipstick); Future  - POCT urinalysis dipstick (Ambulatory Protocol - Point of Care Urinalysis Dipstick)    2. Frequent urination  - POCT urinalysis dipstick (Ambulatory Protocol - Point of Care Urinalysis Dipstick); Future  - POCT urinalysis dipstick (Ambulatory Protocol - Point of Care Urinalysis Dipstick)    3. Vaginal wound, initial encounter  - HSV Type 1 and 2 by PCR Group, Exudate/Lesion/Vesicle/Wound; Future  - HSV Type 1 and 2 by PCR Group, Exudate/Lesion/Vesicle/Wound  - Pramoxine-Zinc Oxide-Calamine 1-0.4-0.4 % CREA; Apply small amount to the wound 2-3 times daily.  Dispense: 113 g; Refill: 0    4. Vaginal pain  - HSV Type 1 and 2 by PCR Group, Exudate/Lesion/Vesicle/Wound; Future  - Chlamydia trachomatis/Neisseria gonorrhoeae PCR, Genital; Future  - Fungal Culture, Genital Swab; Future  - Bacterial Vaginosis Screen; Future  - Bacterial Vaginosis Screen  - Fungal Culture, Genital Swab  - Chlamydia trachomatis/Neisseria gonorrhoeae PCR, Genital  - HSV Type 1 and 2 by PCR Group, Exudate/Lesion/Vesicle/Wound  - Pramoxine-Zinc Oxide-Calamine 1-0.4-0.4 % CREA; Apply small amount to the wound 2-3 times daily.  Dispense: 113 g; Refill: 0    5. Acute UTI  - sulfamethoxazole-trimethoprim DOUBLE strength 800-160 mg tablet; Take 1 tablet by mouth two (2) times daily for 7 days.  Dispense: 14 tablet; Refill: 0    Orders Placed This Encounter   ? POCT urinalysis dipstick (Ambulatory Protocol - Point of Care Urinalysis Dipstick)      If the pain continues or the lesion does not resolve patient must follow up with Gyn for further evaluation and treatment.  Patient voiced understanding.  Risks and benefits of medications/treatments discussed and acknowledged  Follow up with PCP  Strict return/ED precautions given. Instructed to return to urgent/ED if symptoms persist or worsen.  The above plan of care, diagnosis, order, and follow up were discussed with the patient, and the patient voiced understanding.  All patient's questions were answered satisfactory and agreed with this recommended plan of care. See AVS  for additional information and counseling materials provided to the patient.

## 2022-01-09 LAB — Fungal Culture: FUNGAL CULTURE: NEGATIVE

## 2022-01-09 MED ORDER — VALACYCLOVIR HCL 1 G PO TABS
1000 mg | ORAL_TABLET | Freq: Two times a day (BID) | ORAL | 2 refills | Status: AC
Start: 2022-01-09 — End: 2022-01-15

## 2022-01-09 MED ORDER — LIDOCAINE HCL URETHRAL/MUCOSAL 2 % EX GEL
0 refills | Status: AC
Start: 2022-01-09 — End: 2022-01-15

## 2022-01-09 MED ORDER — LIDOCAINE HCL URETHRAL/MUCOSAL 2 % EX GEL
0 refills | Status: AC
Start: 2022-01-09 — End: 2022-01-10

## 2022-01-11 ENCOUNTER — Telehealth: Payer: Self-pay

## 2022-01-11 LAB — Chlamydia trachomatis/Neisseria gonorrhoeae PCR: CHLAMYDIA TRACHOMATIS PCR: NEGATIVE

## 2022-01-11 LAB — Fungal Culture: FUNGAL CULTURE: NEGATIVE

## 2022-01-11 NOTE — Telephone Encounter
01/11/2022     Patient scheduled for allergy skin testing. Patient is requesting a sooner appointment, I advised I placed her on the wait list for now. Patient asked that I ask Dr.Pham if there is any way to accommodate. Please advise. ~vmr~

## 2022-01-12 DIAGNOSIS — A6004 Herpesviral vulvovaginitis: Secondary | ICD-10-CM

## 2022-01-13 ENCOUNTER — Ambulatory Visit: Payer: Self-pay

## 2022-01-13 ENCOUNTER — Telehealth: Payer: Self-pay

## 2022-01-13 NOTE — Telephone Encounter
Discuss results , appt scheduled with dr Tenna Delaine on 7/24 and has been added to wait list

## 2022-01-13 NOTE — Telephone Encounter
PDL Call to Clinic    Reason for Call:    Appointment Related?  '[x]'$  Yes  '[]'$  No     If yes;  Date: 01/13/22  Time:8AM    Call warm transferred to PDL: '[x]'$  Yes  '[]'$  No    Call Received by Clinic Representative:Erica    If call not answered/not accepted, call received by Patient Services Representative:

## 2022-01-13 NOTE — Progress Notes
[facility]  [address]  [place] [address]  Phone: [phone]  Fax: [phone]    Internal Medicine Follow Up Visit  Provider/Author: Leta Baptist, MD  Patient's Name: [name] [name]  MRN: [id number]  DOB: 05-15-1986  Visit Date:01/13/2022    Chief Complaint: No chief complaint on file.    HPI   Alyna Stensland is a 36 y.o. female presents for No chief complaint on file.    ***    OBJECTIVE/DATA:     No outpatient medications have been marked as taking for the 01/13/22 encounter (Appointment) with Resa Miner., MD.     No Known Allergies    Physical Exam   There were no vitals filed for this visit.  There is no height or weight on file to calculate BMI.     Physical Exam   Labs/Studies      Lab Studies:  {Labs reviewed today (optional):37493}    Imaging Studies:     ASSESSMENT AND PLAN:   Jeselle is a 36 y.o. adult here for:    No diagnosis found.      ***        Conditions not discussed during today's visit:     #abnormal mammogram  #breast cancer screening Last Assessed: 04/27/2021   - repeat mammogram  ?  #hyperlipidemia Last Assessed: 04/27/2021  Chronic, ***  10-year ASCVD risk  cannot be calculated because at least one required variable is not available in [facility]. as of 5:23 PM on 01/12/2022  10-year ASCVD risk with optimal risk factors cannot be calculated.  recommends that patient is not in statin benefit group. Encourage adherence to heart-healthy lifestyle.     - low cholesterol, moderate carb, heart healthy diet.  - exercise at least 150 min/week      #Raynaud's phenomenon Last Assessed: 04/27/2021  Stable.   - avoid triggers   - consider CCB if more bothersome  ?  #contraception mgmt Last Assessed: 04/27/2021   - Ortho tri-cyclen      No orders of the defined types were placed in this encounter.       Follow Up: No follow-ups on file.    The above plan of care including diagnosis, orders, and follow-up were discussed with the patient, who expressed understanding and agreement. The patient was given the opportunity to ask questions which were answered satisfactorily during the visit.    Author:  Waverly Ferrari. Dub Mikes, MD 01/12/2022 5:22 PM  Assistant Clinical Professor  [employer] [place]  Department of Medicine  [facility]    This note was in part dictated. Please excuse any typos as a result.    I have:   Reviewed/ordered []  1 []  2 []  ? 3 unique laboratory, radiology, and/or diagnostic tests noted below    Reviewed []  1 []  2 []  ? 3 prior external notes and incorporated into patient assessment    []  Discussed management or test interpretation with external provider(s) as noted      {dpcodingbymdmproblem (Optional):33948}  {dpcodingbymdmdata (Optional):33949}  {dpcodingbymdmrisk (Optional):33950}

## 2022-01-13 NOTE — Telephone Encounter
Patient would like to be accomodated for the appointment she missed today due to their being no parking... advised PSR Barbie Haggis will call patient back

## 2022-01-14 ENCOUNTER — Ambulatory Visit: Payer: BLUE CROSS/BLUE SHIELD

## 2022-01-14 DIAGNOSIS — K130 Diseases of lips: Secondary | ICD-10-CM

## 2022-01-14 DIAGNOSIS — A6004 Herpesviral vulvovaginitis: Secondary | ICD-10-CM

## 2022-01-14 DIAGNOSIS — N898 Other specified noninflammatory disorders of vagina: Secondary | ICD-10-CM

## 2022-01-14 MED ORDER — VALACYCLOVIR HCL 500 MG PO TABS
500 mg | ORAL_TABLET | Freq: Two times a day (BID) | ORAL | 2 refills | 10.00000 days | Status: AC
Start: 2022-01-14 — End: ?

## 2022-01-14 MED ORDER — LIDOCAINE HCL URETHRAL/MUCOSAL 2 % EX GEL
2 refills | Status: AC
Start: 2022-01-14 — End: ?

## 2022-01-14 NOTE — Progress Notes
[facility]  [address]  [place] [address]  Phone: [phone]  Fax: [phone]    Internal Medicine Follow Up Visit  Provider/Author: Leta Baptist, MD  Patient's Name: [name] [name]  MRN: [id number]  DOB: 03-18-1986  Visit Date:01/14/2022    Chief Complaint: Follow-up (Lab results)    HPI   [name] [name] is a 36 y.o. female presents for Follow-up (Lab results)    Here for f/u for genital herpes, new infection.  Vaginal tear about 2 weeks ago, now improving/resolved.  HSV lesions started 5 days ago.    OBJECTIVE/DATA:     Medications that the patient states to be currently taking   Medication Sig   ? [DISCONTINUED] lidocaine 2% jelly Apply to affected area daily prn.   ? [DISCONTINUED] valACYclovir 1000 mg tablet Take 1 tablet (1,000 mg total) by mouth two (2) times daily For 10 days.     No Known Allergies    Physical Exam     Vitals:    01/14/22 1049   BP: 92/60   Pulse: 84   Resp: 14   Temp: 36.8 ?C (98.3 ?F)   TempSrc: Forehead   SpO2: 99%   Weight: 125 lb 12.8 oz (57.1 kg)   Height: 5' 3'' (1.6 m)     Body mass index is 22.28 kg/m?Marland Kitchen     Physical Exam  Vitals and nursing note reviewed. Exam conducted with a chaperone present.   Constitutional:       Appearance: Normal appearance.   HENT:      Mouth/Throat:      Lips: Lesions present.     Genitourinary:     Exam position: Lithotomy position.      Pubic Area: No rash.       Labia:         Right: Lesion present.         Left: Lesion present.           Comments: perineal HSV in ulcerative stage       Labs/Studies      Lab Studies:      Imaging Studies:     ASSESSMENT AND PLAN:   [name] [name] is a 36 y.o. adult here for:    1. Herpes simplex vulvovaginitis    2. Vaginal lesion    3. Lip lesion          Herpes simplex vulvovaginitis  New. About 5-6 days from symptom onset  Discussed natural history of HSV, transmission risk, recurrences, treatment.  - lidocaine 2% jelly; Apply to affected area daily prn.  Dispense: 30 mL; Refill: 2  - valACYclovir 500 mg tablet; Take 1 tablet (500 mg total) by mouth two (2) times daily For 3 days.  Dispense: 12 tablet; Refill: 2    Vaginal lesion  Likely tear per immediate care visit.  Resolving.  - monitor    Lip lesion  Likely HSV vs apthous uler  - HSV Type 1 and 2 by PCR Group, Lesion; Future         Conditions not discussed during today's visit:     #abnormal mammogram  #breast cancer screening Last Assessed: 04/27/2021   - repeat mammogram  ?  #hyperlipidemia Last Assessed: 04/27/2021  Chronic, stable  10-year ASCVD risk  cannot be calculated because at least one required variable is not available in [facility]. as of 2:10 PM on 01/14/2022  10-year ASCVD risk with optimal risk factors cannot be calculated.  recommends that patient  is not in statin benefit group. Encourage adherence to heart-healthy lifestyle.     - low cholesterol, moderate carb, heart healthy diet.  - exercise at least 150 min/week      #Raynaud's phenomenon Last Assessed: 04/27/2021  Stable.   - avoid triggers   - consider CCB if more bothersome  ?  #contraception mgmt Last Assessed: 04/27/2021   - Ortho tri-cyclen      Orders Placed This Encounter   ? HSV Type 1 and 2 by PCR Group, Lesion   ? lidocaine 2% jelly   ? valACYclovir 500 mg tablet        Follow Up: Return in about 3 months (around 04/16/2022) for CPE, or sooner as needed.    The above plan of care including diagnosis, orders, and follow-up were discussed with the patient, who expressed understanding and agreement. The patient was given the opportunity to ask questions which were answered satisfactorily during the visit.    Author:  Waverly Ferrari. Dub Mikes, MD 01/14/2022 2:10 PM  Assistant Clinical Professor  [employer] [place]  Department of Medicine  [facility]    This note was in part dictated. Please excuse any typos as a result.    30 minutes were spent personally by me on the day of this encounter which include the encounter day's pre-visit review of the chart, obtaining appropriate history, performing an evaluation, documentation and discussion of management with details supported within the note for the visit on the encounter date. The time documented was exclusive of any time spent on the separately billed procedure(s).

## 2022-01-15 ENCOUNTER — Ambulatory Visit: Payer: Self-pay | Attending: Student in an Organized Health Care Education/Training Program

## 2022-01-15 LAB — HSV Type 1 and 2 by PCR: HSV TYPE 1 DNA: NOT DETECTED

## 2022-01-17 ENCOUNTER — Ambulatory Visit: Payer: Self-pay

## 2022-01-18 ENCOUNTER — Ambulatory Visit: Payer: Self-pay

## 2022-01-25 ENCOUNTER — Telehealth: Payer: BLUE CROSS/BLUE SHIELD

## 2022-01-25 NOTE — Telephone Encounter
Called lvm to see if patient would like to schedule a sooner Allergy skin test..      [facility] please connect patient with office

## 2022-01-25 NOTE — Telephone Encounter
PDL Call to Clinic    Reason for Call:  Per pt returning Airreka's call regarding sooner appt.     Appointment Related?  '[x]'$  Yes  '[]'$  No     If yes;  TBD  Date:  Time:    Call warm transferred to PDL: '[x]'$  Yes  '[]'$  No    Call Received by Clinic Representative:  Seth Bake    If call not answered/not accepted, call received by Patient Services Representative:

## 2022-02-01 ENCOUNTER — Ambulatory Visit: Payer: Self-pay

## 2022-02-02 DIAGNOSIS — K130 Diseases of lips: Secondary | ICD-10-CM

## 2022-02-02 DIAGNOSIS — A6004 Herpesviral vulvovaginitis: Secondary | ICD-10-CM

## 2022-02-02 NOTE — Progress Notes
[facility]  [address], [address]  [place] [address]  Phone: [phone]  Fax: [phone]    Internal Medicine Follow Up Visit  Provider/Author: Leta Baptist, MD  Patient's Name: [name] [name]  MRN: [id number]  DOB: 1985-11-13  Visit Date:02/03/2022   Chief Complaint: No chief complaint on file.    HPI   [name] [name] is a 36 y.o. female presents for No chief complaint on file.    ***    Needs TB test for work.    OBJECTIVE/DATA:     No outpatient medications have been marked as taking for the 02/03/22 encounter (Appointment) with Resa Miner., MD.     No Known Allergies    Physical Exam     There were no vitals filed for this visit.  There is no height or weight on file to calculate BMI.     Physical Exam  Vitals and nursing note reviewed. Exam conducted with a chaperone present.   Constitutional:       Appearance: Normal appearance.   HENT:      Mouth/Throat:      Lips: Lesions present.     Genitourinary:     Exam position: Lithotomy position.      Pubic Area: No rash.       Labia:         Right: Lesion present.         Left: Lesion present.           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 Not Detected   HSV Type 2 DNA Not Detected  Not Detected Not Detected   Specimen Type  Wound Lesion   !: Data is abnormal  Rpt: View report in Results Review for more information    Imaging Studies:     ASSESSMENT AND PLAN:   [name] [name] is a 36 y.o. adult here for:    No diagnosis found.     ***      Conditions not discussed during today's visit:      Herpes simplex vulvovaginitis  New. About 5-6 days from symptom onset  Discussed natural history of HSV, transmission risk, recurrences, treatment.  - lidocaine 2% jelly; Apply to affected area daily prn.  Dispense: 30 mL; Refill: 2  - valACYclovir 500 mg tablet; Take 1 tablet (500 mg total) by mouth two (2) times daily For 3 days.  Dispense: 12 tablet; Refill: 2    Vaginal lesion  Likely tear per immediate care visit.  Resolving.  - monitor    Lip lesion  Likely aphthous ulcer.  HSV - neg  - monitor    #abnormal mammogram  #breast cancer screening Last Assessed: 04/27/2021   - repeat mammogram  ?  #hyperlipidemia Last Assessed: 04/27/2021  Chronic, stable.  Not in statin benefit group  -  low cholesterol, moderate carb, heart healthy diet.  - exercise at least 150 min/week       #Raynaud's phenomenon Last Assessed: 04/27/2021  Stable.   - avoid triggers   - consider CCB if more bothersome  ?  #contraception mgmt  - off COCPs      No orders of the defined types were placed in this encounter.       Follow Up: No follow-ups on file.    The above plan of care including diagnosis, orders, and follow-up were discussed with the patient, who expressed understanding and agreement. The patient was given the opportunity to ask questions which were answered satisfactorily during the visit.    Author:  Waverly Ferrari. Dub Mikes, MD 02/02/2022 4:52 PM  Assistant Clinical Professor  [employer] [place]  Department of Medicine  [facility]    This note was in part dictated. Please excuse any typos as a result.    30 minutes were spent personally by me on the day of this encounter which include the encounter day's pre-visit review of the chart, obtaining appropriate history, performing an evaluation, documentation and discussion of management with details supported within the note for the visit on the encounter date. The time documented was exclusive of any time spent on the separately billed procedure(s).

## 2022-02-03 ENCOUNTER — Ambulatory Visit: Payer: Self-pay

## 2022-02-03 DIAGNOSIS — L293 Anogenital pruritus, unspecified: Secondary | ICD-10-CM

## 2022-02-03 DIAGNOSIS — Z201 Contact with and (suspected) exposure to tuberculosis: Secondary | ICD-10-CM

## 2022-02-03 MED ORDER — KETOCONAZOLE 2 % EX CREA
Freq: Two times a day (BID) | TOPICAL | 0 refills | Status: AC
Start: 2022-02-03 — End: ?

## 2022-02-03 NOTE — Patient Instructions
-   use ketoconazole cream externally in the affected area of likely fungal skin infection. It should resolve within 5-7 days pending test results.

## 2022-02-04 LAB — Fungal Culture: FUNGAL CULTURE: NEGATIVE

## 2022-02-04 LAB — Bacterial Vaginosis Screen

## 2022-02-06 LAB — Fungal Culture: FUNGAL CULTURE: NEGATIVE

## 2022-02-07 ENCOUNTER — Ambulatory Visit: Payer: Self-pay

## 2022-02-10 ENCOUNTER — Ambulatory Visit: Payer: BLUE CROSS/BLUE SHIELD

## 2022-02-10 ENCOUNTER — Institutional Professional Consult (permissible substitution): Payer: Self-pay

## 2022-02-10 ENCOUNTER — Ambulatory Visit: Payer: Self-pay

## 2022-02-10 DIAGNOSIS — Z201 Contact with and (suspected) exposure to tuberculosis: Secondary | ICD-10-CM

## 2022-02-10 DIAGNOSIS — Z131 Encounter for screening for diabetes mellitus: Secondary | ICD-10-CM

## 2022-02-10 DIAGNOSIS — I7300 Raynaud's syndrome without gangrene: Secondary | ICD-10-CM

## 2022-02-10 DIAGNOSIS — Z130 Encounter for screening for diseases of the blood and blood-forming organs and certain disorders involving the immune mechanism: Secondary | ICD-10-CM

## 2022-02-10 DIAGNOSIS — Z0000 Encounter for general adult medical examination without abnormal findings: Secondary | ICD-10-CM

## 2022-02-10 DIAGNOSIS — Z1329 Encounter for screening for other suspected endocrine disorder: Secondary | ICD-10-CM

## 2022-02-10 DIAGNOSIS — Z13220 Encounter for screening for lipoid disorders: Secondary | ICD-10-CM

## 2022-02-10 DIAGNOSIS — E7800 Pure hypercholesterolemia, unspecified: Secondary | ICD-10-CM

## 2022-02-10 DIAGNOSIS — L293 Anogenital pruritus, unspecified: Secondary | ICD-10-CM

## 2022-02-11 LAB — UA,Dipstick: BILIRUBIN: NEGATIVE (ref 1.005–1.030)

## 2022-02-11 LAB — UA,Microscopic: RBCS HPF: 0 {cells}/[HPF] (ref 0–2)

## 2022-02-11 LAB — PROTEIN/CREATININE RATIO, URINE: CREATININE,RANDOM URINE: 8.9 mg/dL (ref 0.0–0.4)

## 2022-02-11 MED ORDER — CLINDAMYCIN PHOSPHATE 1 % EX GEL
Freq: Two times a day (BID) | TOPICAL | 0 refills | Status: AC
Start: 2022-02-11 — End: ?

## 2022-02-14 NOTE — Addendum Note
Addended by: Michaelle Birks on: 02/14/2022 08:02 AM     Modules accepted: Orders

## 2022-02-15 ENCOUNTER — Ambulatory Visit: Payer: Self-pay

## 2022-02-16 ENCOUNTER — Telehealth: Payer: BLUE CROSS/BLUE SHIELD

## 2022-02-18 ENCOUNTER — Institutional Professional Consult (permissible substitution): Payer: Self-pay

## 2022-02-18 ENCOUNTER — Telehealth: Payer: Self-pay | Attending: Rheumatology

## 2022-02-18 DIAGNOSIS — I7300 Raynaud's syndrome without gangrene: Secondary | ICD-10-CM

## 2022-02-18 DIAGNOSIS — R809 Proteinuria, unspecified: Secondary | ICD-10-CM

## 2022-02-18 DIAGNOSIS — R760 Raised antibody titer: Secondary | ICD-10-CM
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
## 2022-02-19 LAB — UA,Microscopic: RBCS: 8 {cells}/uL (ref 0–11)

## 2022-02-19 LAB — UA,Dipstick: NITRITE: NEGATIVE (ref 1.005–1.030)

## 2022-02-19 LAB — PROTEIN/CREATININE RATIO, URINE: TOTAL PROT/CREAT RATIO,URINE: 0.1 mg/dL (ref 0.0–0.4)

## 2022-03-09 ENCOUNTER — Telehealth: Payer: BLUE CROSS/BLUE SHIELD

## 2022-03-09 NOTE — Telephone Encounter
Left voice message to confirmed location change to SM.    Thank you,  [name] [name]

## 2022-03-15 ENCOUNTER — Ambulatory Visit: Payer: Self-pay

## 2022-03-15 ENCOUNTER — Telehealth: Payer: Self-pay

## 2022-03-15 NOTE — Telephone Encounter
I LVM and sent mychart advising that I had to reschedule patient due to md being unavailable. Patient has been moved to next available.     ~vmr~

## 2022-03-24 ENCOUNTER — Telehealth: Payer: Self-pay

## 2022-03-24 ENCOUNTER — Ambulatory Visit: Payer: Self-pay

## 2022-03-24 NOTE — Telephone Encounter
Call Back Request      Reason for call back: Pt would like to know if she will be having an external or internal ultrasound for the appt below, pt would also like to know if the ultrasound is covered by her insurance    If not available please leave a message   Any Symptoms:  '[]'$  Yes  '[x]'$  No       If yes, what symptoms are you experiencing:    o Duration of symptoms (how long):    o Have you taken medication for symptoms (OTC or Rx):      If call was taken outside of clinic hours:    '[]'$ Patient or caller has been notified that this message was sent outside of normal clinic hours.     '[]'$ Patient or caller has been warm transferred to the physician's answering service. If applicable, patient or caller informed to please call us back if symptoms progress.  Patient or caller has been notified of the turnaround time of 1-2 business day(s).

## 2022-03-24 NOTE — Telephone Encounter
Msg sent to patient via Mychart to confirm benefit is covered for gyn utz.

## 2022-03-25 DIAGNOSIS — N926 Irregular menstruation, unspecified: Secondary | ICD-10-CM

## 2022-03-25 DIAGNOSIS — Z3162 Encounter for fertility preservation counseling: Secondary | ICD-10-CM

## 2022-03-25 DIAGNOSIS — Z119 Encounter for screening for infectious and parasitic diseases, unspecified: Secondary | ICD-10-CM

## 2022-03-25 NOTE — Progress Notes
Transvaginal Gyn Ultrasound done today:    Stopped OCPs 08/2021.  Retroverted uterus measures 6.8 x 4.1 x 4.7 cm.   Endometrial thicknesss measures 4.0 mm.   The right ovary measures 3.2 x 1.8 x 2.1 cm, with 12 antral follicles.   The left ovary measures 1.7 x 1.0 x 1.7 cm, with 8 antral follicles.      Impression:   - Desiring fertility preservation (oocyte vs embryo)  - Irregular periods    Plan:   - Labs: [facility], mandated labs  - Pt will call Megan to review pricing, timeline and let us know if she opts for oocytes vs embryos.    SCRIBE SIGNATURE:  ISimeon Craft, have assisted Dr. Joyce Gross A. Al-Safi with the documentation for [facility] on 03/26/2022 at 9:10 AM.    15 minutes were spent personally by me today on this encounter which include today's pre-visit review of the chart, obtaining appropriate history, performing an evaluation, documentation and discussion of management with details supported within the note for today's visit. The time documented was exclusive of any time spent on the separately billed procedure.

## 2022-03-26 ENCOUNTER — Ambulatory Visit: Payer: Self-pay

## 2022-03-26 ENCOUNTER — Ambulatory Visit: Payer: BLUE CROSS/BLUE SHIELD

## 2022-04-06 DIAGNOSIS — E7800 Pure hypercholesterolemia, unspecified: Secondary | ICD-10-CM

## 2022-04-06 DIAGNOSIS — Z0000 Encounter for general adult medical examination without abnormal findings: Secondary | ICD-10-CM

## 2022-04-06 DIAGNOSIS — I7300 Raynaud's syndrome without gangrene: Secondary | ICD-10-CM

## 2022-04-06 DIAGNOSIS — A6004 Herpesviral vulvovaginitis: Secondary | ICD-10-CM

## 2022-04-07 ENCOUNTER — Ambulatory Visit: Payer: BLUE CROSS/BLUE SHIELD

## 2022-04-07 DIAGNOSIS — Z7185 Vaccine counseling: Secondary | ICD-10-CM

## 2022-04-07 DIAGNOSIS — R760 Raised antibody titer: Secondary | ICD-10-CM

## 2022-04-07 DIAGNOSIS — R202 Paresthesia of skin: Secondary | ICD-10-CM

## 2022-04-07 DIAGNOSIS — Z1231 Encounter for screening mammogram for malignant neoplasm of breast: Secondary | ICD-10-CM

## 2022-04-07 DIAGNOSIS — Z23 Encounter for immunization: Secondary | ICD-10-CM

## 2022-04-07 NOTE — Progress Notes
[facility]  [address]  [place] [address]  Phone: [phone]  Fax: [phone]    Internal Medicine Annual Wellness Visit  Provider/Author: Leta Baptist, MD  Patient's Name: [name] [name]  MRN: [id number]  DOB: 1986-05-26  Visit Date:04/07/2022    Chief Complaint: Annual Exam    HPI     Some dry mouth. ?dry eye. While traveling. Now better.    Paresthesias in the hands and arms. Occurs with arm about the head when sleeping.  Feels like blood draining.  Palmar. Ring finger and less in rest of the hands  Doesn't rest elbows on desk when working.  Onset 6-12 months.    ASSESSMENT AND PLAN:       ICD-10-CM    1. Wellness examination  Z00.00 Mammo tomosynthesis, screening, bilat breast      2. Vaccine counseling  Z71.85     encouraged COVID vaccination. patient defers out of concerns. Agreed to complete exemption form for work.      3. Need for immunization against influenza  Z23 Influenza vaccine IM;   PF      4. Encounter for screening mammogram for malignant neoplasm of breast  Z12.31 Mammo tomosynthesis, screening, bilat breast      5. Pure hypercholesterolemia  E78.00       6. Raynaud's phenomenon without gangrene  I73.00       7. Herpes simplex vulvovaginitis  A60.04       8. Paresthesia of arm  R20.2 XR chest pa+lat (2 views)     XR cervical spine ap+lat neutral+flexion+extension (4 views)           Problem   Paresthesia of Arm    04/07/2022 New onset in the past 6-12 months. Concern for thoracic outlet syndrome vs cervical disease vs cubital tunnel syndrome  - check plain films     Herpes Genitalis    04/07/2022 Recent recurrence. Resolved with round of valacyclovir.  - continue valacyclovir 500mg  BID for 3 days prn recurrences     Hyperlipidemia    04/07/2022 Diet controlled. Not in statin benefit group  - low cholesterol, moderate carb, heart healthy diet, and exercise at least 150 min/week       Raynaud Phenomenon    04/07/2022  Stable. Extensive workup with Rheumatology positive for SSA and anticardiolipin, but no signs of connective tissue disease on history/exam.  Rheumatology: Dr Harrie Foreman  - consider CCB if more bothersome  - c/w Rheumatology        Follow Up: Return in about 1 year (around 04/08/2023) for CPE, or sooner as needed.    The above plan of care including diagnosis, orders, and follow-up were discussed with the patient, who expressed understanding and agreement. The patient was given the opportunity to ask questions which were answered satisfactorily during the visit.    ANNUAL WELLNESS VISIT   [name] [name] is a 36 y.o. female who is here for their annual exam:    Social History     Social History Narrative    Diet: omnivore. meat rarely. Fast food, none. Soda, none. +veggies +fruits.    Exercise: hiking, home workouts. 3+ days per week.    Alcohol: once weekly, 2 drinks on average.    Occupation: psychologist at [employer]    Sleep: improved, but still some difficulties, mostly regarding temperature      Blood pressure: acceptable  Weight: acceptable    Vaccinations discussed. Flu vaccine today. Encouraged COVID  vaccination.  Cancer Screening discussed.  Labs/imaging/procedures/vaccinations are as ordered.    Counseling/Screening:  I counseled on diet and exercise.  I counseled on routine dental care.  I counseled on regular eye exams.  I counseled on sunscreen use.    PHQ-9 total score: Total Score: 0 (04/07/22 0837)  GAD-7 total score: Total Score: 0 (04/07/22 0837)  DAST total score:Total Score: 0 (04/07/22 0837)  Audit-C total score: Total Score: 2 (04/07/22 0837)     Health Maintenance   Topic Date Due   ? COVID-19 Vaccine(Tracks primary and booster doses, not sup/immunocomp) (4 - Pfizer series) 07/08/2022 (Originally 06/22/2020)   ? Cervical Ca Screening: HPV Testing  04/29/2024   ? Cervical Ca Screening: PAP Smear  04/29/2024   ? Tdap/Td Vaccine (2 - Td or Tdap) 12/28/2026   ? Hepatitis B Screening  Completed   ? Influenza Vaccine  Completed   ? Hepatitis C Screening  Completed   ? HIV Screening  Completed        Prob List     Patient Active Problem List   Diagnosis   ? Raynaud phenomenon   ? Globus sensation   ? Heat intolerance   ? Lumbar disc disease with radiculopathy   ? Hyperlipidemia   ? Herpes genitalis   ? Wellness examination   ? Paresthesia of arm   ? Anti-cardiolipin antibody positive       PMH     Past Medical History:   Diagnosis Date   ? Chicken pox    ? GERD (gastroesophageal reflux disease)    ? 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 Hyperlipidemia     History of [name] elevation. No longer present   ? Seasonal allergies    ? Sexually transmitted infection     Herpes       PSxH     Past Surgical History:   Procedure Laterality Date   ? WISDOM TOOTH EXTRACTION         ALL   No Known Allergies    MEDS     Medications that the patient states to be currently taking   Medication Sig   ? fluticasone 50 mcg/act nasal spray Spray 1 spray by nasal route two (2) times daily.       [facility] AND [facility]     Family History   Problem Relation Age of Onset   ? Lupus Mother    ? Arthritis Mother    ? Scleroderma Father    ? Arthritis Father    ? Breast cancer Maternal Grandmother 31   ? Gout Maternal Grandfather    ? Stroke Paternal Grandmother    ? Arthritis Paternal Grandmother    ? Skin cancer Paternal Grandfather    ? Stroke Paternal Grandfather    ? Breast cancer Maternal Great-Grandmother      Social History     Tobacco Use   ? Smoking status: Never   ? Smokeless tobacco: Never   Substance Use Topics   ? Alcohol use: Yes     Alcohol/week: 0.6 oz     Types: 1 Glasses of Wine (5 oz) per week     Comment: social        ROS   []  10-point ROS reviewed: normal unless stated in HPI            []  reviewed questionnaire on 04/07/2022 (updated in chart)        04/07/2022  8:42 AM 04/10/2020    10:06 AM   Review of Systems   Fevers No No   Unusual fatigue No No   Very sudden vision change No No   Eye pain or irritation No No Runny nose No No   Sore throat No No   Chest pain No No   Swelling of legs/ankles No No   Cough No No   Shortness of breath No No   Nausea No No   Vomiting No No   Abdominal pain No No   Diarrhea No No   Constipation Yes No   Genital lesions No No   Unusual discharge No No   Pain on urination No No   Body aches Yes No   Joint aches Yes No   Skin lesions No No   Rash No No   Weakness No No   Numbness Yes No   Depression No No   Anxiety No No   Weight change No No   Hair loss No No   Unusually frequent urination No No   Bleeding No No   Swollen lymph nodes (glands) No No   Allergies No No   Unusually frequent infections No No        PHYSICAL EXAM     Vitals:    04/07/22 0933   BP: 102/71   Pulse: 96   Temp: 36.9 ?C (98.5 ?F)   SpO2: 97%   Weight: 123 lb (55.8 kg)   Height: 5' 3'' (1.6 m)     Body mass index is 21.79 kg/m?Marland Kitchen    Physical Exam  Vitals and nursing note reviewed.   Constitutional:       General: She is awake. She is not in acute distress.     Appearance: Normal appearance. She is well-developed. She is not ill-appearing.   HENT:      Head: Normocephalic and atraumatic.      Right Ear: Hearing, tympanic membrane, ear canal and external ear normal.      Left Ear: Hearing, tympanic membrane, ear canal and external ear normal.      Nose: No congestion or rhinorrhea.      Mouth/Throat:      Mouth: Mucous membranes are moist.      Pharynx: Oropharynx is clear.   Eyes:      General: Lids are normal.      Extraocular Movements: Extraocular movements intact.      Conjunctiva/sclera: Conjunctivae normal.      Pupils: Pupils are equal, round, and reactive to light.   Neck:      Thyroid: No thyroid mass, thyromegaly or thyroid tenderness.      Trachea: Trachea normal.   Cardiovascular:      Rate and Rhythm: Normal rate and regular rhythm.      Pulses:           Radial pulses are 2+ on the right side and 2+ on the left side.      Heart sounds: Normal heart sounds.   Pulmonary:      Effort: Pulmonary effort is normal. Breath sounds: Normal breath sounds.   Abdominal:      General: Bowel sounds are normal.      Palpations: Abdomen is soft.   Musculoskeletal:         General: No deformity. Normal range of motion.      Cervical back: Normal range of motion and neck supple.      Thoracic back: No deformity or bony tenderness.  Lumbar back: No deformity or bony tenderness.      Right lower leg: No edema.      Left lower leg: No edema.   Lymphadenopathy:      Cervical: No cervical adenopathy.      Upper Body:      Right upper body: No supraclavicular adenopathy.      Left upper body: No supraclavicular adenopathy.   Skin:     General: Skin is warm and dry.      Findings: No rash.   Neurological:      Mental Status: Mental status is at baseline.      Cranial Nerves: Cranial nerves 2-12 are intact.      Sensory: No sensory deficit.      Motor: No weakness.      Gait: Gait normal.   Psychiatric:         Mood and Affect: Mood normal.         Behavior: Behavior normal.         Thought Content: Thought content normal.         Cognition and Memory: Cognition normal.         Judgment: Judgment normal.       LABS/STUDIES      Lab Studies:  Last Lipid Panel:  Results for orders placed or performed in visit on 02/14/22   Lipid Panel   Result Value Ref Range    Cholesterol 176 See Comment mg/dL    Cholesterol,LDL,Calc 76 <100 mg/dL    Cholesterol, HDL 84 >50 mg/dL    Triglycerides 82 <161 mg/dL    Non-HDL,Chol,Calc 92 <130 mg/dL     Last W9U:  Results for orders placed or performed in visit on 02/14/22   Hgb A1c - HPLC   Result Value Ref Range    Hgb A1c - HPLC 5.2 <5.7 %     Last CMP:  Results for orders placed or performed in visit on 02/14/22   Comprehensive Metabolic Panel   Result Value Ref Range    Sodium 135 135 - 146 mmol/L    Potassium 3.6 3.6 - 5.3 mmol/L    Chloride 99 96 - 106 mmol/L    Total CO2 22 20 - 30 mmol/L    Anion Gap 14 8 - 19 mmol/L    Glucose 79 65 - 99 mg/dL    Creatinine 0.45 4.09 - 1.30 mg/dL    Estimated GFR >81 See GFR Additional Information mL/min/1.35m2    GFR Additional Information See Comment     Urea Nitrogen 11 7 - 22 mg/dL    Calcium 9.2 8.6 - 19.1 mg/dL    Total Protein 7.8 6.1 - 8.2 g/dL    Albumin 4.3 3.9 - 5.0 g/dL    Bilirubin,Total 0.8 0.1 - 1.2 mg/dL    Alkaline Phosphatase 57 37 - 113 U/L    Aspartate Aminotransferase 19 13 - 62 U/L    Alanine Aminotransferase 14 8 - 70 U/L     Last TSH with reflex:  Results for orders placed or performed in visit on 02/14/22   TSH with reflex FT4, FT3   Result Value Ref Range    TSH 1.7 0.3 - 4.7 mcIU/mL     Last CBC:  Results for orders placed or performed in visit on 02/14/22   CBC   Result Value Ref Range    White Blood Cell Count 6.90 4.16 - 9.95 x10E3/uL    Red Blood Cell Count 4.63 3.96 - 5.09  x10E6/uL    Hemoglobin 13.8 11.6 - 15.2 g/dL    Hematocrit 16.1 09.6 - 45.2 %    Mean Corpuscular Volume 88.1 79.3 - 98.6 fL    Mean Corpuscular Hemoglobin 29.8 26.4 - 33.4 pg    MCH Concentration 33.8 31.5 - 35.5 g/dL    Red Cell Distribution Width-SD 42.8 36.9 - 48.3 fL    Red Cell Distribution Width-CV 13.2 11.1 - 15.5 %    Platelet Count, Auto 284 143 - 398 x10E3/uL    Mean Platelet Volume 10.0 9.3 - 13.0 fL    Nucleated RBC%, automated 0.0 No Ref. Range %    Absolute Nucleated RBC Count 0.00 0.00 - 0.00 x10E3/uL    Neutrophil Abs (Prelim) 2.92 See Absolute Neut Ct. x10E3/uL   Differential, Automated   Result Value Ref Range    Neutrophil Percent, Auto 42.3 No Ref. Range %    Lymphocyte Percent, Auto 46.7 No Ref. Range %    Monocyte Percent, Auto 7.8 No Ref. Range %    Eosinophil Percent, Auto 2.2 No Ref. Range %    Basophil Percent, Auto 1.0 No Ref. Range %    Immature Granulocytes% 0.0 No Reference Range %    Absolute Neut Count 2.92 1.80 - 6.90 x10E3/uL    Absolute Lymphocyte Count 3.22 1.30 - 3.40 x10E3/uL    Absolute Mono Count 0.54 0.20 - 0.80 x10E3/uL    Absolute Eos Count 0.15 0.00 - 0.50 x10E3/uL    Absolute Baso Count 0.07 0.00 - 0.10 x10E3/uL    Absolute Immature Gran Count 0.00 0.00 - 0.04 x10E3/uL   CBC & Plt & Diff    Narrative    The following orders were created for panel order CBC & Plt & Diff.  Procedure                               Abnormality         Status                     ---------                               -----------         ------                     EAV[[id number]]                                              Final result               Differential, Automated[[id number]]                          Final result                 Please view results for these tests on the individual orders.     Last UA:  Results for orders placed or performed in visit on 02/10/22   Urinalysis Routine *Canceled*    Specimen: Clean Catch, Midstream; Urine    Narrative    The following orders were created for panel order Urinalysis Routine.  Procedure  Abnormality         Status                     ---------                               -----------         ------                     UA,Dipstick[[id number]]                                                                 UA,Microscopic[[id number]]                                                                Please view results for these tests on the individual orders.     Last Pap smear:  Results for orders placed or performed in visit on 04/30/19   Pap Smear   Result Value Ref Range    CASE REPORT       Gynecologic Cytology Report                       Case: [id number]                                Authorizing Provider:  Resa Miner., MD      Collected:           04/30/2019 1327              Ordering Location:     [facility]      Received:            05/01/2019 1349                                     [facility]                                                                        First Screen:          Marco Collie, CT(ASCP) Specimen:    Cy  Exo/Endo Smear Thin Prep-Screen, Exo/Endocervix                                         INTERPRETATION-RESULT Negative for Intraepithelial Lesion or Malignancy.      SPECIMEN ADEQUACY       Satisfactory for evaluation.  Endocervical cells/Transformation zone absent.    Signatures       I certify that I personally conducted a gross and/or microscopic examination(s) of the described specimen(s), and have reviewed the interpretation of this test and diagnosis(es).  I agree with the documented findings or edited the findings as necessary.       Last Cervical HPV:  Results for orders placed or performed in visit on 04/30/19   HPV DNA PCR,Genital    Specimen: Exo/Endocervix; Genital Cytology   Result Value Ref Range    HPV Type 16 Negative Negative for High Risk HPV DNA    HPV Type 18 Negative Negative for High Risk HPV DNA    Other High Risk HPV Negative Negative for High Risk HPV DNA    Narrative    Other High Risk HPV include: [phone] and 68.  Not tested for Low Risk HPV DNA.     Last HIV:  Results for orders placed or performed in visit on 04/28/18   HIV-1/2 Ag/Ab 4th Generation with Reflex Confirmation   Result Value Ref Range    HIV-1/2 Ag/Ab Screen 4th Generation Nonreactive Nonreactive    Narrative    Ingestion of high levels of biotin in dietary supplements may lead to falsely negative results.     Last Hep C:  Results for orders placed or performed in visit on 04/30/19   HCV Antibody Screen   Result Value Ref Range    HCV Ab Screen Nonreactive Nonreactive    Narrative    Ingestion of high levels of biotin in dietary supplements may lead to falsely negative results.      Last Hep B:  Lab Results   Component Value Date    HEPBSURAG Nonreactive 04/30/2021         10-year ASCVD risk  cannot be calculated because at least one required variable is not available in [facility]. as of 12:47 PM on 04/07/2022  10-year ASCVD risk with optimal risk factors cannot be calculated.  2018 ACC/AHA guidelines recommends that patient is not in statin benefit group. Encourage adherence to heart-healthy lifestyle.      Imaging Studies:   Last mammogram:  Results for orders placed during the hospital encounter of 10/27/21    Mammo tomosynthesis, screening, bilat breast 10/27/2021 (Final)    Impression  There is no mammographic evidence of malignancy.    Routine screening mammogram at age 69 is recommended.    BI-RADS Category 1:  Negative        Report Electronically Signed by: Angelena Sole 10/28/2021    Technologist: Mosetta Anis          Orders Placed This Encounter   ? XR chest pa+lat (2 views)   ? XR cervical spine ap+lat neutral+flexion+extension (4 views)   ? Mammo tomosynthesis, screening, bilat breast   ? Influenza vaccine IM;   PF        Author:  Waverly Ferrari. Dub Mikes, MD 04/07/2022 12:47 PM  Assistant Clinical Professor  [employer] [facility] [place]  Department of [facility]  This note was in part dictated. Please excuse any typos as a result.

## 2022-04-07 NOTE — Patient Instructions
It was a pleasure to see you for your Annual Preventative Exam today. Remember that what is part of the covered preventative exam is actually limited. Chronic and new health conditions are often not covered and if we discussed many of these other things it may result in a co-pay, contingent on your insurance.    Things to do that we discussed during today's visit:  - for dry eyes use artificial  - for dry mouth consider cottonmouth candy if needed.  - do xrays of the chest and neck    - Please be patient with test results. Labs will be released to you automatically as soon as they are done. This means that you will see them before I do. I will typically review them and place comments on the labs themselves or send a message with a summary within a few days of ALL of the labs being completed. Imaging and other types of results are not automatically released and must be released manually. If you haven't heard from me a week after having the labs done don't hesitate to contact the office for updates.     -If you haven't already, don't forget to sign up for the [facility] patient portal so that you can see your lab results and send messages to our office.    -Due to the increasingly complex nature of the MyChart messaging system, our physicians have adopted the [facility] policy. When a physician responds to an inquiry sent on MyChart via message or telephone call, the patient's insurance may be billed for the communication encounter. These charges may or may not be covered by your insurance provider. If your physician determines that your concern is too complex for this format of communication, they may recommend scheduling a visit with [place]. If your message results in a scheduled visit or falls within seven (7) days of an upcoming/completed visit, you will not be billed for the communication.     In addition to the plan above and specific recommendations we discussed during your visit, please consider the following things you can do to maintain your physical health.    Diet:  - In general, it is best to eat a well rounded diet with plenty of vegetables and lean proteins with a moderate amount of high quality carbohydrates (whole grain carbs such as brown rice, whole grain breads, quinoa, farro and other ancient grains).     - A colorful plate is a healthy plate. The colors in the fruits and veggies are where all of your anti-oxidants and anti-inflammatories come from. At least half of your plate should be veggies and fruit, with a focus on the veggies.    - Many different diets have been studied and it continues to appears that the best diet for preventing heart disease and diabetes is the Mediterranean diet. The [facility] has some great starter information on this diet:  TeleconferenceOnDemand.fr  BatPromos.tn    Carbs:  - Try to avoid simple sugars such as table sugar (sucrose) and products with lots of high fructose corn syrup as these can increase the risk of diabetes and lead to weight gain. You do still need carbs, but they should be the better quality carbs that I mentioned above. Carbs shouldn't be more than 45-60% of your daily diet.    Fat:  - The total consumption of fat is less important than the type of fat that you eat. Monounsaturated fats (such as those found in olive oil and nuts) and  polyunsaturated fats (omega-3s such as those found in fish) are the best. Try to avoid lots of saturated fat as these can increase cholesterol and heart disease. Trans-fats should be avoided or eliminated.     Protein:  - Increasing protein in the diet and subsequently decreasing carbs can help with weight loss and maintenance. If you are trying to loose weight you can consider replacing a meal with a protein shake that is low in carbs. Be careful as many workout protein shakes can be higher in carbs. Having 25-30% of your daily calories from protein can boost metabolism and help with weight loss.    - Lean proteins are best and are found in fish, poultry, beans, legumes, lentils and nuts. Red meat (beef, veal, pork and lamb) is high in saturated fat, which can increase cholesterol and the risk of heart disease.     - If you are strength training with a goal of increasing lean muscle mass higher protein intake is necessary to see gains. I generally recommend 1.6 to 2.0 grams of protein for every kilogram of body weight (or 0.7 to 0.9 gram per pound of body weight).    Fiber:  - Fiber helps to slow down the absorption of food, helps you to feel full and helps to prevent constipation (as long as you are drinking plenty of fluids). At a minimum you need 14 grams of fiber for each 1000 calories in your diet. This is about 25-36 grams of fiber per day, depending on how much you are eating. More fiber can be helpful, and may help to prevent diabetes and heart disease. If you take a fiber supplement, always remember to take with plenty of water.    Supplements:  - In general, most well rounded diets contain all the vitamins and minerals that your body needs without any additional supplementation. If you are concerned you need supplementation a daily multivitamin should make up for any deficiencies. I typically recommend [employer] Made brand as it can be found in any local pharmacy, is inexpensive and is verified by an outside labs to contain what the label says it contains.    - Vitamin B12 supplementation can be helpful for low energy. It is water soluble, so you can't really overdue it, but keep in mind that anything your body can't use you will pee out. This means that taking very high doses isn't likely to hurt you, but you are literally peeing away $$$.    - Biotin can help with hair and nails, but it also interferes with many of our lab tests. If you take a biotin supplement it is best to stop it for at least a week prior to have labs done.    - Other supplements such as turmeric may be helpful on a case by case basis. If you have questions about specific supplements please ask me during our next visit.    Alcohol:  - For women it is perfectly fine to have 1 standard size drink (1 glass of beer or wine or a single 1 oz serving of liquor) once a day. For men it is ok to have 2. People often drink a bit more than this on the weekends or special occasions. While we all like to splurge, this is best done only occasionally and having more than 3-4 drinks at any one time can have significant impacts on the body including: weight gain from extra calories, elevations in blood pressure and heart rate, increased risk of cardiovascular  disease and diabetes, sleep disruption and insomnia, waking up at night to urinate, increased risk of physical and psychological dependence on alcohol.    Exercise:  - It is important to exercise regularly to maintain your cardiovascular health and to prevent weight gain. It is also great for your mental health!  - We recommend a minimum of at least 150 minutes of exercise per week. That's thirty minutes 5 days per week or another combination, although it is probably better to spread things out at least a little bit.  - If you like the gym that is a great option, but if you aren't the kind of person who likes the gym then there is no need to pay for a membership that you won't use. Hiking, biking, running, even walking count!  - Remember, with exercise more is actually better, as long as you as you don't push so hard as to hurt yourself!    Ergonomics:  We have been seeing a lot of back and neck pain during the pandemic as people have been working from home, not always with the best set up. Here is a helpful video on youtube from the [employer] on workspace ergonomics: https://dixon.info/ Other topics:  - Save your skin! Always wear sunscreen if you are going to be outside. Remember that when you are driving, your left arm and the left side of your face are exposed to the sun. Using a facial moisturizer with some SPF is a great way to protect your skin, and apply a bit to the backs of the hands for extra protection.    -Don't use Q-tips! Instead, clean your ear non-traumatically using Debrox (or the pharmacy equivalent). Put 5-10 drops in your ear and let it sit for 1-2 minutes. Then let the liquid and wax drain out. You can do this once every 1-2 weeks to prevent buildup of ear wax.    -Always wear your seatbelt!

## 2022-04-30 ENCOUNTER — Telehealth: Payer: Self-pay

## 2022-05-03 ENCOUNTER — Ambulatory Visit: Payer: BLUE CROSS/BLUE SHIELD

## 2022-05-03 NOTE — Telephone Encounter
Completed and sent to pt via mychart msg

## 2022-05-03 NOTE — Telephone Encounter
Pt notified and she requesting the completed form also mailed out . Will do so today 05/03/22 at 9:43am

## 2022-05-25 ENCOUNTER — Ambulatory Visit: Payer: BLUE CROSS/BLUE SHIELD

## 2022-05-26 ENCOUNTER — Ambulatory Visit: Payer: Self-pay

## 2022-06-17 ENCOUNTER — Telehealth: Payer: BLUE CROSS/BLUE SHIELD

## 2022-06-17 NOTE — Telephone Encounter
Call Back Request      Reason for call back: Patient was rescheduled from 05/26/22 to 06/25/22, message received by portal.  Patient did mention in her response, she will not be available the last week of December, however the appointment remained.  She cannot make the December appt as she will be out of the country.  Patient stated she has waited along time for this appointment.  Patient is leaving the country tonight, she will not be returning until January 09, 24.  Please reschedule for sometime after she returns.  Please leave a detailed message on her patient portal so she can access it.    Can someone please call patient back to schedule.  Thank you,          Any Symptoms:  '[]'$  Yes  '[]'$  No      If yes, what symptoms are you experiencing:    Duration of symptoms (how long):    Have you taken medication for symptoms (OTC or Rx):      If call was taken outside of clinic hours:    '[]'$ Patient or caller has been notified that this message was sent outside of normal clinic hours.     '[]'$ Patient or caller has been warm transferred to the physician's answering service. If applicable, patient or caller informed to please call us back if symptoms progress.  Patient or caller has been notified of the turnaround time of 1-2 business day(s).

## 2022-06-23 NOTE — Telephone Encounter
06/23/2022     I have accommodated patient. Okay per md. Sent mychart message to let pt know.     ~vmr~~

## 2022-06-25 ENCOUNTER — Ambulatory Visit: Payer: Self-pay

## 2022-07-07 ENCOUNTER — Ambulatory Visit: Payer: BLUE CROSS/BLUE SHIELD

## 2022-07-07 DIAGNOSIS — J3089 Other allergic rhinitis: Secondary | ICD-10-CM

## 2022-07-07 DIAGNOSIS — T781XXA Other adverse food reactions, not elsewhere classified, initial encounter: Secondary | ICD-10-CM

## 2022-07-07 NOTE — Progress Notes
Follow-up for [name] [name] is a 37 y.o. female with allergic rhinitis    Initial visit 11/13/2021  Nasal congestion, runny nose, sneezing, occ PND, right ear fullness, slight frontal sinus pressure  Right ear fullness, sneezing are most bothersome symptoms  History of IT as a child    A few months ago she had concern for enlarged lymph node on left, US showed benign finding    History obtained 07/07/2022  Here for allergy skin testing  Symptoms improved from last visit, not requiring any medication  At last visit in spring, symptoms were more pronounced  No longer has lymph node swelling  Use of afrin prior to flight was helpful    Past Medical History:  Past Medical History:   Diagnosis Date    Chicken pox     GERD (gastroesophageal reflux disease)     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 No longer present    Seasonal allergies     Sexually transmitted infection     Herpes         Past Surgical History:  Past Surgical History:   Procedure Laterality Date    WISDOM TOOTH EXTRACTION           Allergies:  No Known Allergies      Medications:  Medications that the patient states to be currently taking   Medication Sig    fluticasone 50 mcg/act nasal spray Spray 1 spray by nasal route two (2) times daily.    valACYclovir 500 mg tablet Take 1 tablet (500 mg total) by mouth two (2) times daily For 3 days.         Family History:  Family History   Problem Relation Age of Onset    Lupus Mother     Arthritis Mother     Scleroderma Father     Arthritis Father     Breast cancer Maternal Grandmother 52    Gout Maternal Grandfather     Stroke Paternal Grandmother     Arthritis Paternal Grandmother     Skin cancer Paternal Grandfather     Stroke Paternal Grandfather     Breast cancer Maternal Great-Grandmother          Social History:  Social History     Socioeconomic History    Marital status: Single   Occupational History    Occupation: psychology fellow     Employer: [employer]   Tobacco Use    Smoking status: Never    Smokeless tobacco: Never   Substance and Sexual Activity    Alcohol use: Yes     Alcohol/week: 0.6 oz     Types: 1 Glasses of Wine (5 oz) per week     Comment: social     Drug use: No    Sexual activity: Yes     Partners: Male     Birth control/protection: Withdrawal / Coitus Interruptus   Other Topics Concern    Do you exercise at least a day, 3 or more days a week? Yes    Do you follow a special diet? No    Vegan? No    Vegetarian? No    Pescatarian? No    Lactose Free? No    Gluten Free? No    Omnivore? No    Types of Exercise? (List in Comments) Yes     Comment: Walking/hiking, home workouts   Social History Narrative  Diet: omnivore. meat rarely. Fast food, none. Soda, none. +veggies +fruits.    Exercise: hiking, home workouts. 3+ days per week.    Alcohol: once weekly, 2 drinks on average.    Occupation: psychologist at [employer]    Sleep: improved, but still some difficulties, mostly regarding temperature     Rhinitis:Yes  Sinusitis: No  Conjunctivitis: No  Asthma: No  Eczema: No  Food allergies: No  Drug allergies: No  Venom allergy: No    Environmental History:  Residence: [place]  Bedroom floor: hardwood floor  Mattress/Pillow: none  Pets: none  Occasional exposures: windy days, strong smells  Occupational exposures: psychologist    BP 105/72  ~ Pulse 76  ~ Temp 36.4 ?C (97.6 ?F) (Forehead)  ~ Resp 18  ~ Ht 5' 3'' (1.6 m)  ~ BMI 21.79 kg/m? ; Body mass index is 21.79 kg/m?Marland Kitchen  Physical exam:  Gen - in no acute distress, well nourished, well developed  Skin - no rashes    Lab & Diagnostic Studies:  Lab Results   Component Value Date    WBC 6.90 02/14/2022    HGB 13.8 02/14/2022    HCT 40.8 02/14/2022    MCV 88.1 02/14/2022    PLT 284 02/14/2022     Lab Results   Component Value Date    CREAT 0.77 02/14/2022    BUN 11 02/14/2022    NA 135 02/14/2022    K 3.6 02/14/2022    CL 99 02/14/2022    CO2 22 02/14/2022    ALT 14 02/14/2022    AST 19 02/14/2022    ALKPHOS 57 02/14/2022    BILITOT 0.8 02/14/2022    ALBUMIN 4.3 02/14/2022      07/07/2022  2:32 PM   ALG SKIN TEST    Time Antigens Placed 11:15    Location Arm    Allergen Manufacturer [employer]    Testing Nurse Doreene Adas, [place]    Reviewing Physician Karren Burly. Arlester Marker, MD    Select Antigens Environmental Panels    Olive, European +    Mulberry 0    Marisue [place] 0    Oak Tree 0    Walnut Tree 8/8    Elm Tree 0    Ash AM Mix 0    Sycamore 0    Cottonwood 0    Birch 0    Doran, [place] 0    Acacia Tree 0    Maple 0    Eucalyptus 0    [place] Grass 0    Timothy Grass 0    Johnson Grass 0    Short Ragweed 0    Red Root Pigweed 0    Sage/Mugwort 0    Russian Thistle 0    Scale Mix 0    Lamb's Quarter 0    Dock, Yellow 0    English Plantain 0    Cocklebur Weed 0    Kochia 0    Alternaria 0    Cladosporia Herbarum 0    Aspergillus Mix 0    Penicillium Chrysogenum 0    Helminthosporium Sativum 0    Chaetomium globosum 0    Dust Mite (DF) 0    Dust Mite (DP) 0    Cat 0    Dog 0    Cockroach Mix 0    Histamine 7/16    Saline 0      Assessment: [name] [name] is a 37 y.o. female who presents with  1. Allergic rhinitis (tree): nasal congestion, runny nose, sneezing, occ PND, right ear fullness, slight frontal sinus pressure. Right ear fullness, sneezing are most bothersome symptoms. History of IT as a child  - Skin test with sensitization to pollen, reviewed environmental control measures. Can use the following regimen as needed, not requiring at this time but potentially in spring  - Continue azelastine PRN  - Start flonase 2 spray/nostril, reviewed proper technique  - Start ipratropium PRN  - Prior to flights, afrin and sudafed morning of flight    2. Adverse reaction to food. Digestive symptoms  - Reviewed food allergy vs food intolerance and symptoms not suggestive of allergy. Defer IgE food testing, patient agrees  - Discussed histamine intolerance and provided list of high histamine foods    Patient is in agreement with plan of care.  I have answered all questions satisfactorily.    Return to clinic PRN or sooner with any questions or concerns.     40 minutes were spent personally by me today on this encounter which include today's pre-visit review of the chart, obtaining appropriate history, performing an evaluation, documentation and discussion of management with details supported within the note for today's visit. The time documented was exclusive of any time spent on the separately billed procedure.    Debara Pickett, MD  Allergy/Immunology

## 2022-07-07 NOTE — Patient Instructions
Skin testing today was positive for tree. Testing for grass, weed, mold, dust, cat, and dog were negative    Allergy treatment includes the following:  1) Avoidance measures (strategies discussed below to minimize exposure)  2) Medications  3) Allergy shots - can cure you of allergies however takes 5-6 years. Comprised of two phases (build-up, 33 steps, requires once or twice weekly visits for 4-8 months) followed by maintenance phase (once a month for 3-5 years).  Not usually recommended as first line treatment, typically try medications first (as they are convenient, low side effect profile) but if not fully responsive to medication therapy then shots can be considered.    Pollen counts are highest between 5-10 AM and on windy, dry days so I would recommend sleeping with windows closed to prevent increase in symptoms early in the morning.  Trees tend to bloom during spring, grasses during summer, and weeds during the fall however in [facility] [place] we don't have true seasons; seasons may start sooner and last longer.  You can check pollen count in your area with a weather app or pollen count apps and if the pollen count is high that day (warm, windy, dry days), minimize pollen exposure by keeping your windows up in your car and consider taking an extra dose of antihistamine the night before (at higher doses, it may cause dry mouth and sleepiness in some people).    Meds:  1. Azelastine 1-2 spray/nostril twice a day. Helps with runny nose, sneezing, itching, and to a small degree with congestion. May have bitter/metallic taste as side effect  Take as needed when you are having more sneezing     2. Fluticasone (Flonase) 2 sprays in each nostril once a day. Helps with nasal congestion, runny nose, sneezing, itching, post-nasal drip. Takes a few days to take effect, maximal benefit after 4-6 weeks of consistent use.  Sit down, look down towards the ground.  Insert a tiny tip of the spray (about 1-2 mm) and angle/tilt the spray towards the outside (towards the ear, away from the middle of nose).  After each spray, take a very gentle sniff so that the medicine will coat the middle/upper part of the nostril but don't sniff too hard that you end up tasting it.  It's normal to taste the medicine after a few minutes but if you taste it immediately, you sniffed too hard (not harmful, just means you are wasting the medication).     3. Ipratropium (Atrovent) 1-2 spray in each nostril up to 4 times a day as needed for runny nose or post-nasal drip.  Will not help with nasal congestion or dry nose. Takes effect within 15-20 minutes     When your symptoms are better controlled, you can adjust the above regimen specifically to the symptoms you are experiencing.  Most are fast acting however flonase has a lag time of a few days so rather than stopping it and resuming when you have symptoms, try to find the lowest dose that controls your symptoms such as 1 spray/nostril daily or every other day and then increase/decrease depending on your symptoms.    Prior to flights  - Afrin and sudafed morning of flight and repeat on your flight back    Adverse reaction to food  - Not a food allergy (food allergy reactions are immediate [seconds to minutes] and consist of hives, swelling, trouble breathing, respiratory distress, anaphylaxis)  - Possible histamine intolerance.  If symptoms correlate with these foods, two approaches -  consuming smaller quantities (avoidance) or antihistamine such as zyrtec or allegra at least 1 hour before eating these foods (sometimes 2 tabs may be required)    Chemical allergy (contact dermatitis)  - Poison oak type reaction with blisters  - Test for this is a patch test completed within dermatology department

## 2022-08-10 ENCOUNTER — Ambulatory Visit: Payer: BLUE CROSS/BLUE SHIELD

## 2022-08-10 MED ORDER — VALACYCLOVIR HCL 500 MG PO TABS
500 mg | ORAL_TABLET | Freq: Two times a day (BID) | ORAL | 2 refills | 10.00000 days
Start: 2022-08-10 — End: ?

## 2022-08-11 MED ORDER — VALACYCLOVIR HCL 500 MG PO TABS
500 mg | ORAL_TABLET | Freq: Two times a day (BID) | ORAL | 2 refills | Status: AC
Start: 2022-08-11 — End: ?

## 2022-08-12 DIAGNOSIS — R760 Raised antibody titer: Secondary | ICD-10-CM

## 2022-12-23 ENCOUNTER — Telehealth: Payer: Self-pay

## 2022-12-23 DIAGNOSIS — Z1283 Encounter for screening for malignant neoplasm of skin: Secondary | ICD-10-CM

## 2022-12-23 DIAGNOSIS — N644 Mastodynia: Secondary | ICD-10-CM

## 2022-12-23 DIAGNOSIS — L989 Disorder of the skin and subcutaneous tissue, unspecified: Secondary | ICD-10-CM

## 2022-12-23 DIAGNOSIS — K5900 Constipation, unspecified: Secondary | ICD-10-CM

## 2022-12-23 NOTE — Progress Notes
[facility]  [address], [address]  [place] [address]  Phone: [phone]  Fax: [phone]    Internal Medicine Telehealth Visit  Provider/Author: Alexandria Lodge, MD  Patient's Name: [name] [name]  MRN: [id number]  DOB: Oct 23, 1985  Visit Date:12/23/2022    Patient Consent to Telehealth Questionnaire       06/20/2019    12:55 PM   [facility] TELEHEALTH PRECHECKIN QUESTIONS   By clicking ''I Agree'', I consent to the below:  I Agree     - I agree  to be treated via a video visit and acknowledge that I may be liable for any relevant copays or coinsurance depending on my insurance plan.  - I understand that this video visit is offered for my convenience and I am able to cancel and reschedule for an in-person appointment if I desire.  - I also acknowledge that sensitive medical information may be discussed during this video visit appointment and that it is my responsibility to locate myself in a location that ensures privacy to my own level of comfort.  - I also acknowledge that I should not be participating in a video visit in a way that could cause danger to myself or to those around me (such as driving or walking).  If my provider is concerned about my safety, I understand that they have the right to terminate the visit.     Video visit completed using myChart video encounter.    Provider Location: work ([address], [address], [place], [place] [address])  Patient Location: home    Chief Complaint: Referrals    HPI   [name] [name] is a 37 y.o. female presents for Referrals    Requesting referrals.   Dermatology for skin cancer screening. Reports has white spots all over her skin (not visualized on video as blurry)    Requesting referral for GI. Has been constipated. Had to take 3 rounds of laxatives before having a bowel movement. Improved her diet.     Breast tenderness  - bilateral. No other symptoms  - grandmother w/ breast cancer  - got authorization to get early mammograms    REVIEW OF SYSTEMS Review of Systems   All other systems reviewed and are negative.       MEDS     No outpatient medications have been marked as taking for the 12/23/22 encounter (Telemedicine) with Alexandria Lodge, MD.     No Known Allergies    LABS/STUDIES   I have:   []  Reviewed/ordered []  1 []  2 []  ? 3 unique laboratory, radiology, and/or diagnostic tests noted below    []  Reviewed []  1 []  2 []  ? 3 prior external notes and incorporated into patient assessment    []  Discussed management or test interpretation with external provider(s) as noted        A&P   [name] [name] is a a 37 y.o. female presenting for Referrals      1. Skin lesion  2. Screening for skin cancer  - Referral to Dermatology    3. Constipation, unspecified constipation type  - referral placed per pt request. Recommended getting thyroid checked  - Referral to Gastroenterology    4. Breast tenderness in female  - discussed rtc precautions including new breast lump/ mass, nipple discharge, skin changes  - Mammogram, diagnostic, bilat breasts; Future  - [place] left breast, diagnostic; Future  - [place] right breast, diagnostic; Future     Follow Up: prn  The above plan of care including diagnosis, orders, and follow-up were discussed with the patient, who expressed understanding and agreement. The patient was given the opportunity to ask questions which were answered satisfactorily during the visit.    Author:  Alexandria Lodge, MD 12/23/2022 12:42 PM  Clinical Instructor of Medicine  [employer]  Department of Medicine  [facility]    This note was in part dictated. Please excuse any typos as a result.

## 2022-12-29 ENCOUNTER — Ambulatory Visit: Payer: Self-pay

## 2022-12-29 ENCOUNTER — Telehealth: Payer: Self-pay

## 2022-12-29 NOTE — Telephone Encounter
No other availability after this this appt

## 2022-12-29 NOTE — Telephone Encounter
Left voice msg

## 2022-12-29 NOTE — Telephone Encounter
Please offer to schedule at [facility] ,see if they have any availability for today, we are closed tomorrow 7/4 for the holiday

## 2022-12-31 ENCOUNTER — Telehealth: Payer: BLUE CROSS/BLUE SHIELD

## 2022-12-31 DIAGNOSIS — B354 Tinea corporis: Secondary | ICD-10-CM

## 2022-12-31 MED ORDER — KETOCONAZOLE 2 % EX CREA
Freq: Every day | TOPICAL | 1 refills | Status: AC
Start: 2022-12-31 — End: ?

## 2022-12-31 NOTE — Progress Notes
PATIENT: [name] [name]  MRN: [id number]  DOB: 01/06/1986  DATE OF SERVICE: 12/31/2022    PRIMARY CARE PROVIDER: Resa Miner., MD    CHIEF COMPLAINT:   Chief Complaint   Patient presents with    Rash          Patient Consent to Telehealth Questionnaire       06/20/2019    12:55 PM   [facility] TELEHEALTH PRECHECKIN QUESTIONS   By clicking ''I Agree'', I consent to the below:  I Agree     - I agree  to be treated via a video visit and acknowledge that I may be liable for any relevant copays or coinsurance depending on my insurance plan.  - I understand that this video visit is offered for my convenience and I am able to cancel and reschedule for an in-person appointment if I desire.  - I also acknowledge that sensitive medical information may be discussed during this video visit appointment and that it is my responsibility to locate myself in a location that ensures privacy to my own level of comfort.  - I also acknowledge that I should not be participating in a video visit in a way that could cause danger to myself or to those around me (such as driving or walking).  If my provider is concerned about my safety, I understand that they have the right to terminate the visit.         Subjective:      [name] [name] is a 37 y.o. female who presents with with rash.    Rash on left shoulder. Present for 5 days. Applied fungal cream (Fungasin with partial improvement. Previously had on the face.     No one else at home has a rash.      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 No longer present    Seasonal allergies     Sexually transmitted infection     Herpes     Outpatient Medications Prior to Visit   Medication Sig    valACYclovir 500 mg tablet Take 1 tablet (500 mg total) by mouth two (2) times daily For 3 days.     No facility-administered medications prior to visit.     No Known Allergies      Review of Systems:   A 14-system review of systems was performed and is negative except as stated in the history of present illness.     Objective:        Vitals:  There were no vitals taken for this visit.   General:   alert, appears stated age, cooperative, and no distress   Skin:    Erythematous circular lesion on lateral left shoulder   Head:   Normocephalic, without obvious abnormality, atraumatic   Psychiatric:   oriented to time, place and person, mood and affect are within normal limits       Lab Review:   not applicable     Imaging Review:   No imaging has been resulted in the last 30 days        Assessment & Plan:        [name] [name] is a 37 y.o. female with a problem list as below.     Patient Active Problem List  Diagnosis    Raynaud phenomenon    Globus sensation    Heat intolerance    Lumbar disc disease with radiculopathy    Hyperlipidemia    Herpes genitalis    Wellness examination    Paresthesia of arm    Anti-cardiolipin antibody positive       Patient was evaluated today for the following.    1. Tinea corporis  - Rash consistent with tinea corporis  - Previously treating with griseofulvin topical with partial improvement  - Start ketoconazole 2% daily until rash clears  - Counseled on hygiene to prevent spread  - Consider nummular eczema as alternative if symptoms not improving  - Return precautions reviewed    - ketoconazole 2% cream; Apply topically daily.  Dispense: 30 g; Refill: 1        Orders Placed This Encounter    ketoconazole 2% cream          Return if symptoms worsen or fail to improve.       The above plan of care, diagnosis, orders, and follow-up were discussed with the patient. Questions related to this recommended plan of care were answered.      Future Appointments (if any - see below for last 10 appointments)  Future Appointments   Date Time Provider Department Center   02/04/2023  4:00 PM Al-Safi, Stefanie Libel., MD [facility] Author:  Arcelia Jew 12/31/2022 2:30 PM

## 2023-01-31 ENCOUNTER — Telehealth: Payer: Self-pay

## 2023-01-31 ENCOUNTER — Ambulatory Visit: Payer: Self-pay

## 2023-01-31 NOTE — Telephone Encounter
Spoke with pt, appt was already scheduled, and also scheduled appt for physical

## 2023-01-31 NOTE — Telephone Encounter
Call Back Request      Reason for call back: Pt would like to know if for her 08/07 apt she can get her annual lab orders placed. Pt states she was told in order to get labs she has to see Dr. Dub Mikes first.  Pt states she does not need the physical done yet but would like to do her annual labs and other labs on 08/07, states it is hard for her to get her blood drawn so she is wanting to get everything done at once. Please confirm     Any Symptoms:  []  Yes  [x]  No      If yes, what symptoms are you experiencing:    Duration of symptoms (how long):    Have you taken medication for symptoms (OTC or Rx):      If call was taken outside of clinic hours:    [] Patient or caller has been notified that this message was sent outside of normal clinic hours.     [] Patient or caller has been warm transferred to the physician's answering service. If applicable, patient or caller informed to please call us back if symptoms progress.  Patient or caller has been notified of the turnaround time of 1-2 business day(s).

## 2023-01-31 NOTE — Telephone Encounter
SW[facility], advised that MD is no longer seeing patients for Gen Derm. Offered first available on 02/01/2023 w/ Dr. Langley Gauss @ 1:30 PM Pt declined due to work schedule. Reviewed all department appts- soonest available is on 09/23. Advised pt to call in periodically to check in for cancellations. Pt expressed understanding and will CB to check in for cancellations. Pt is currently scheduled for 03/21/2023 @ 1:15 pm with Dr. Morrison Old

## 2023-02-01 ENCOUNTER — Ambulatory Visit: Payer: Self-pay | Attending: Student in an Organized Health Care Education/Training Program

## 2023-02-01 DIAGNOSIS — Z130 Encounter for screening for diseases of the blood and blood-forming organs and certain disorders involving the immune mechanism: Secondary | ICD-10-CM

## 2023-02-01 DIAGNOSIS — E7800 Pure hypercholesterolemia, unspecified: Secondary | ICD-10-CM

## 2023-02-01 DIAGNOSIS — Z13220 Encounter for screening for lipoid disorders: Secondary | ICD-10-CM

## 2023-02-01 DIAGNOSIS — Z131 Encounter for screening for diabetes mellitus: Secondary | ICD-10-CM

## 2023-02-01 DIAGNOSIS — Z113 Encounter for screening for infections with a predominantly sexual mode of transmission: Secondary | ICD-10-CM

## 2023-02-01 DIAGNOSIS — Z136 Encounter for screening for cardiovascular disorders: Secondary | ICD-10-CM

## 2023-02-01 DIAGNOSIS — Z1329 Encounter for screening for other suspected endocrine disorder: Secondary | ICD-10-CM

## 2023-02-02 ENCOUNTER — Ambulatory Visit: Payer: Self-pay

## 2023-02-02 ENCOUNTER — Telehealth: Payer: Self-pay

## 2023-02-02 DIAGNOSIS — Z3162 Encounter for fertility preservation counseling: Secondary | ICD-10-CM

## 2023-02-02 DIAGNOSIS — Z113 Encounter for screening for infections with a predominantly sexual mode of transmission: Secondary | ICD-10-CM

## 2023-02-02 DIAGNOSIS — Z1329 Encounter for screening for other suspected endocrine disorder: Secondary | ICD-10-CM

## 2023-02-02 DIAGNOSIS — Z131 Encounter for screening for diabetes mellitus: Secondary | ICD-10-CM

## 2023-02-02 DIAGNOSIS — Z136 Encounter for screening for cardiovascular disorders: Secondary | ICD-10-CM

## 2023-02-02 DIAGNOSIS — Z13220 Encounter for screening for lipoid disorders: Secondary | ICD-10-CM

## 2023-02-02 DIAGNOSIS — Z119 Encounter for screening for infectious and parasitic diseases, unspecified: Secondary | ICD-10-CM

## 2023-02-02 DIAGNOSIS — L989 Disorder of the skin and subcutaneous tissue, unspecified: Secondary | ICD-10-CM

## 2023-02-02 DIAGNOSIS — R760 Raised antibody titer: Secondary | ICD-10-CM

## 2023-02-02 DIAGNOSIS — Z130 Encounter for screening for diseases of the blood and blood-forming organs and certain disorders involving the immune mechanism: Secondary | ICD-10-CM

## 2023-02-02 LAB — Hgb A1c: HGB A1C: 5.3 (ref <5.7)

## 2023-02-02 LAB — CBC: MEAN CORPUSCULAR HEMOGLOBIN: 29.3 pg (ref 26.4–33.4)

## 2023-02-02 LAB — HBs Ag: HEPATITIS B SURFACE ANTIGEN: NONREACTIVE

## 2023-02-02 LAB — HCV Ab Screen: HCV ANTIBODY SCREEN: NONREACTIVE

## 2023-02-02 LAB — TSH with reflex FT4, FT3: TSH: 1.1 u[IU]/mL (ref 0.3–4.7)

## 2023-02-02 LAB — HIV-1/2 Ag/Ab 4th Generation with Reflex Confirmation: HIV-1/2 AG/AB 4TH GENERATION WITH REFLEX CONFIRMATION: NONREACTIVE

## 2023-02-02 LAB — Differential Automated: ABSOLUTE EOS COUNT: 0.13 10*3/uL (ref 0.00–0.50)

## 2023-02-02 NOTE — Telephone Encounter
Urgent Care medical records request was sent on 02/02/2023  To [facility]

## 2023-02-02 NOTE — Patient Instructions
Use topical benadryl or Calamine lotion as needed for itching.  CeraVe Itch relief (pramoxine)  Stop the ketoconazole  You can use topical hydrocortisone 1% cream (over the counter) on the leg lesions    We have gynecology in [facility] and [place]. In [facility], I recommend Dr Linward Foster or Dr Rogue Bussing. In [place], I recommend Dr Illene Labrador, Dr Lewanda Rife, Dr Blair Heys or Dr Alvy Beal. You can call [phone] to schedule at either location.

## 2023-02-02 NOTE — Progress Notes
[facility]  [address]  [place] [address]  Phone: [phone]  Fax: [phone]    Internal Medicine Follow Up Visit  Provider/Author: Leta Baptist, MD  Patient's Name: [name] [name]  MRN: [id number]  DOB: August 19, 1985  Visit Date:02/02/2023    Chief Complaint: Follow-up (Check red dots on skin )    HPI/Subjective   Verneda Salamat is a 37 y.o. female presents for Follow-up (Check red dots on skin )    Requested labs via mychart. Already ordered for upcoming CPE.    Tinea on the L shoulder in July. Improved with ketoconazole.    Milia on the face.    Gets recurrent subcutaneous light colored lesions on the fingers.    Red dots on the legs. Recent.      ASSESSMENT AND PLAN:     1. Skin lesion    2. Anti-cardiolipin antibody positive    3. Routine screening for STI (sexually transmitted infection)    4. Encounter for screening examination for infectious disease    5. Screening for diabetes mellitus    6. Screening for thyroid disorder    7. Encounter for lipid screening for cardiovascular disease    8. Screening, iron deficiency anemia    9. Antiphospholipid antibody positive    10. Encounter for fertility preservation counseling         1. Skin lesion  Healing lesion on th R elbow. Suspect arthropod bite, possible local infection 2/2 excoriation.    Numerous minute erythematous lesion on the legs appear to be small areas of subcutaneous inflammation. Possible sun exposure reaction. There is a family history of autoimmune disease and she has had positive anticardiolipin and SSA previously. Will check additional labs given this and she will see Dermatology as scheduled.    - Request records from Urgent Care including culture.  - Scleroderma (Scl-70) (ENA) Antibody, IgG; Future  - Antinuclear Ab; Future    2. Anti-cardiolipin antibody positive  02/02/2023 Stable. Also with positive SSA. Due for labs ordered by Rheumatology. Will draw today. F/u with Rheumatology.  Rheumatology: Dr Harrie Foreman    - Scleroderma (Scl-70) (ENA) Antibody, IgG; Future  - Antinuclear Ab; Future     Follow Up: Return for CPE, as scheduled.    The above plan of care including diagnosis, orders, and follow-up were discussed with the patient, who expressed understanding and agreement. The patient was given the opportunity to ask questions which were answered satisfactorily during the visit.    OBJECTIVE/DATA:   Medications and allergies reviewed.    Objective (click to expand/collapse)   Vitals:    02/02/23 0843   BP: 109/68   Pulse: 74   Resp: 14   Temp: 36.7 ?C (98.1 ?F)   TempSrc: Forehead   SpO2: 99%   Weight: 127 lb 12.8 oz (58 kg)   Height: 5' 3'' (1.6 m)     Body mass index is 22.64 kg/m?Marland Kitchen     Physical Exam  Vitals and nursing note reviewed.   Constitutional:       Appearance: Normal appearance.   Skin:                Taken several days ago by patient:    Lab Studies:    Imaging Studies:    Cardiology:    Procedures:         Orders Placed This Encounter    Scleroderma (Scl-70) (ENA) Antibody, IgG    Antinuclear Ab  UA,Dipstick    UA,Microscopic    CBC    Differential, Automated        Author:  Waverly Ferrari. Dub Mikes, MD 02/02/2023 9:15 AM  Assistant Clinical Professor  [employer] [place]  Department of Medicine  [facility]    This note was in part dictated. Please excuse any typos as a result.    I have:   Reviewed/ordered []  1 [x]  2 []  ? 3 unique laboratory, radiology, and/or diagnostic tests noted below    Reviewed []  1 []  2 []  ? 3 prior external notes and incorporated into patient assessment    []  Discussed management or test interpretation with external provider(s) as noted      PROBLEM -  Low complexity   1 acute uncomplicated illness was addressed during this encounter.  DATA COMPLEXITY - Low complexity - review tests, order tests, review notes (2/3) OR indep historian  RISK - Low risk   Low risk of morbidity from diagnostic testing or treatment.

## 2023-02-03 ENCOUNTER — Ambulatory Visit: Payer: Self-pay

## 2023-02-03 LAB — Lipid Panel: CHOLESTEROL,LDL,CALCULATED: 108 mg/dL — ABNORMAL HIGH (ref >50–<100)

## 2023-02-03 LAB — HIV-1/2 Ag/Ab 4th Generation with Reflex Confirmation: HIV-1/2 AG/AB 4TH GENERATION WITH REFLEX CONFIRMATION: NONREACTIVE

## 2023-02-03 LAB — Cardiolipin Ab Quant: CARDIOLIPIN IGG (BF): <20 (ref <=20.0)

## 2023-02-03 LAB — UA,Microscopic: RBCS HPF: 0 {cells}/[HPF] (ref 0–2)

## 2023-02-03 LAB — UA,Dipstick: KETONES: NEGATIVE (ref 5.0–8.0)

## 2023-02-03 LAB — Comprehensive Metabolic Panel
POTASSIUM: 4.2 mmol/L (ref 3.6–5.3)
SODIUM: 137 mmol/L (ref 135–146)

## 2023-02-03 LAB — RPR with TP-PA Confirm: RPR WITH TP-PA CONFIRMATION: NONREACTIVE

## 2023-02-03 LAB — Chlamydia trachomatis/Neisseria gonorrhoeae PCR: CHLAMYDIA TRACHOMATIS PCR: NEGATIVE

## 2023-02-03 LAB — Dilute Russell Viper Venom Time: DRVVT INTERPRETATION: NEGATIVE

## 2023-02-04 ENCOUNTER — Telehealth: Payer: Self-pay

## 2023-02-04 DIAGNOSIS — Z3169 Encounter for other general counseling and advice on procreation: Secondary | ICD-10-CM

## 2023-02-04 DIAGNOSIS — R760 Raised antibody titer: Secondary | ICD-10-CM

## 2023-02-04 LAB — Scleroderma (Scl-70) (ENA) Antibody, IgG: SCLERODERMA (SCL-70) (ENA) ANTIBODY, IGG: 0 [AU]/ml (ref 0–40)

## 2023-02-04 LAB — Antinuclear Ab: ANTINUCLEAR AB: <1:40 {titer}

## 2023-02-04 NOTE — Progress Notes
TELE-HEALTH VISIT  REI FOLLOW UP      [name] [name] is a 37 y.o. G0 following up to review plans for conception   They are leaning to start trying to conceive soon  Ideally would like to have 2-3 children    Currently using withdrawal method  Regular periods       Seen by rheumatologist   Elevated anticardiolipin antibodies and B2 glycoprotein antibodies in the past in addition to positive SSA antibodies  Has Raynaud's        Past GYN History:   History of dysmenorrhea:No.   History of oral contraceptive use: Yes   currently    History of sexually transmitted infections: HSV  History of abnormal pap smear: no   Last PAP smear: 2020. Negative         Prior Treatments for Infertility:   No      Past Obstetrical History:   G:0        Past Medical History:   Neg      Past Surgical History:   Wisdom teeth extraction      Meds:   None      Allergies:   NKDA        Family History:   Great MGM and MGM: breast cancer  MGM tested negative   Mother: Lupus  No FamHx of mental retardation or slow learners     Social History:   Cigarette use: denied  Alcohol intake: 0-4 drinks per week  Elicit drug use: None  Occupation: Warden/ranger   Ethnic Background: White/Middle-eastern        Transvaginal Gyn Ultrasound done 02/2022:    Retroverted uterus measures 6.8 x 4.1 x 4.7 cm.   Endometrial thicknesss measures 4.0 mm.   The right ovary measures 3.2 x 1.8 x 2.1 cm, with 12 antral follicles.   The left ovary measures 1.7 x 1.0 x 1.7 cm, with 8 antral follicles.         ASSESSMENT / PLAN    1. Plans for future pregnancy  Discussed preconception assessment   Will check immunity to rubella, measles and varicella  Check blood type and antibody screen  Partner to do semen analysis    If no pregnancy after 6 months of trying, will re-evaluate and assess with HSG    2. Discussed the negative effects of reproductive aging on fertility  Decreased quantity and quality of oocytes associated with decreased chances of pregnancy, increased chances of miscarriages and chromosomal abnormalities in offspring like Down's syndrome.  Consider fertility preservation options if planning for more than one child in the future    Discussed preimplantation genetic testing for aneuploidy and prenatal testing with invasive and non-invasive options    3. Positive antiphospholipid antibodies and SSA antibodies  Recommended assessment MFM consultation     4. Discussed screening for genetic mutations  Reviewed different ways of transmission for genetic disorders.  Offered expanded carrier genetic testing  Screening is for risk reducing not risk eliminating.  Recommended genetic counseling if carriers for conditions tested        45 minutes were spent personally by me today on this encounter which include today's pre-visit review of the chart, obtaining appropriate history, performing an evaluation, documentation and discussion of management with details supported within the note for today's visit. The time documented was exclusive of any time spent on the separately billed procedure.     Cheryl Flash, MD  Reproductive Endocrinology and Infertility    Patient Consent to Telehealth  The patient agreed to participate in the video visit prior to joining the visit.

## 2023-02-07 ENCOUNTER — Telehealth: Payer: Self-pay

## 2023-02-07 LAB — Beta-2-Glycoprotein Antibody Panel: BETA-2-GLYCOPROTEIN IGA: <10 SAU (ref <=20)

## 2023-02-07 LAB — Anti-Mullerian Hormone: ANTI-MULLERIAN HORMONE: 1.944 ng/mL (ref 0.176–11.705)

## 2023-02-07 NOTE — Telephone Encounter
Appointment Accommodation Request      Appointment Type: New Gyn    Reason for sooner request: Pt states that she was referred for a family planning consult by Dr. Sharman Cheek due to risks of getting pregnant. Pt is requesting to be contacted via mychart.    Date/Time Requested (If any): Open    Last seen by MD: Never    Any Symptoms:  []  Yes  [x]  No      If yes, what symptoms are you experiencing:   Duration of symptoms (how long):     Patient or caller was offered an appointment but declined.    Patient or caller was advised to seek emergency services if conditions are urgent or emergent.    Patient or caller has been notified of the turnaround time of 1-2 business (days).

## 2023-02-08 ENCOUNTER — Telehealth: Payer: Self-pay

## 2023-02-08 ENCOUNTER — Telehealth: Payer: Self-pay | Attending: Rheumatology

## 2023-02-08 DIAGNOSIS — R21 Rash and other nonspecific skin eruption: Secondary | ICD-10-CM

## 2023-02-08 DIAGNOSIS — I7300 Raynaud's syndrome without gangrene: Secondary | ICD-10-CM
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
## 2023-02-08 NOTE — Telephone Encounter
Pt was assisted and scheduled accordingly based on pt's time preference. Pt confirmed appt.

## 2023-02-08 NOTE — Telephone Encounter
Left a call back message to Pt to go over the pre-check in questions before today's video visit with Dr. Verdie Mosher

## 2023-02-09 ENCOUNTER — Ambulatory Visit: Payer: BLUE CROSS/BLUE SHIELD | Attending: Rheumatology

## 2023-02-10 ENCOUNTER — Telehealth: Payer: BLUE CROSS/BLUE SHIELD

## 2023-02-10 NOTE — Telephone Encounter
Appointment Accommodation Request      Appointment Type: Video Visit    Reason for sooner request: Please assist with rescheduling MFM consult on 08/28 to Friday 08/30.    Date/Time Requested (If any): 08/30 if possible    Last seen by MD: New    Any Symptoms:  []  Yes  [x]  No      If yes, what symptoms are you experiencing:   Duration of symptoms (how long):     Patient or caller was offered an appointment but declined.    Patient or caller was advised to seek emergency services if conditions are urgent or emergent.    Patient or caller has been notified of the turnaround time of 1-2 business (days).

## 2023-02-10 NOTE — Telephone Encounter
Left the pt a vm stating that we do not have any availability on 8/30 for a consultation; however, I had offered to reschedule her appointment to another date/time.

## 2023-02-11 NOTE — Telephone Encounter
Call Back Request      Reason for call back: Pt tried calling Richard back but she called after 5pm. Please call pt back to reschedule with MFM video visit     Any Symptoms:  []  Yes  [x]  No      If yes, what symptoms are you experiencing:    Duration of symptoms (how long):    Have you taken medication for symptoms (OTC or Rx):      If call was taken outside of clinic hours:    [] Patient or caller has been notified that this message was sent outside of normal clinic hours.     [] Patient or caller has been warm transferred to the physician's answering service. If applicable, patient or caller informed to please call us back if symptoms progress.  Patient or caller has been notified of the turnaround time of 1-2 business day(s).

## 2023-02-11 NOTE — Telephone Encounter
Left the pt a vm to cb in order to reschedule her consultation.

## 2023-02-11 NOTE — Telephone Encounter
Patient had called back and was transferred from [facility]. I had informed the patient that we do not have any availability on 8/30 for a consultation; however, I had offered to reschedule her appointment to another date/time. Patient decided on keeping her appointment as scheduled.

## 2023-02-17 MED ORDER — TRIAMCINOLONE ACETONIDE 0.025 % EX CREA
0 refills | Status: AC
Start: 2023-02-17 — End: ?

## 2023-02-21 ENCOUNTER — Ambulatory Visit: Payer: BLUE CROSS/BLUE SHIELD

## 2023-02-23 ENCOUNTER — Ambulatory Visit: Payer: BLUE CROSS/BLUE SHIELD

## 2023-02-23 ENCOUNTER — Telehealth: Payer: BLUE CROSS/BLUE SHIELD

## 2023-02-23 NOTE — Telephone Encounter
Left a vm informing pt that her appt has been cancelled as Dr. Ardelia Mems is unable to see her today for the video visit. Offered 09/03 with Dr. Cyndie Chime.

## 2023-02-23 NOTE — Consults
Maternal-Fetal Medicine Consultation Note    PATIENT: [name] [name]  MRN: [id number]  DOB: Apr 08, 1986  DATE OF SERVICE: 02/22/2023  REFERRING PROVIDER: Resa Miner., MD    CC: Preconception consultation for ***    HPI: [name] [name] is a 37 y.o. year-old No obstetric history on file. who is currently not pregnant. She is presenting for a pre-conceptional consultation secondary to ***    Raynaud's - SSA Ab positive  Anticardiolipin and beta-2 glycoprotein positive    [facility] - Dr. 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 No longer present    Seasonal allergies     Sexually transmitted infection     Herpes       Past Surgical History:  Past Surgical History:   Procedure Laterality Date    WISDOM TOOTH EXTRACTION         OB/GYN History:  OB History   No obstetric history on file.       Last menstrual period: No LMP recorded.  Menarche: age *** / Frequency: every *** days / Duration: *** days  Pap smear history:  Last Pap smear:  STIs:  Fibroids, cysts, endometriosis:    Medications:    Current Outpatient Medications:     ketoconazole 2% cream, Apply topically daily., Disp: 30 g, Rfl: 1    triamcinolone 0.025% cream, Apply to affected areas twice daily as needed. Avoid face, axillae and groin.., Disp: 80 g, Rfl: 0    valACYclovir 500 mg tablet, Take 1 tablet (500 mg total) by mouth two (2) times daily For 3 days., Disp: 12 tablet, Rfl: 2    Allergies:   Patient has no known allergies.    Family History:  Family History   Problem Relation Age of Onset    Lupus Mother     Arthritis Mother     Scleroderma Father     Arthritis Father     Breast cancer Maternal Grandmother 33    Gout Maternal Grandfather     Stroke Paternal Grandmother     Arthritis Paternal Grandmother     Skin cancer Paternal Grandfather Stroke Paternal Grandfather     Breast cancer Maternal Great-Grandmother        Denies history of congential anomalies, chromosomal or genetic abnormalities, developmental delay, bleeding/clotting disorders, diabetes, or HTN.  Denies history of breast, uterine, ovarian, colon, prostate, or pancreatic cancer.    Social History:  Social History     Socioeconomic History    Marital status: Single   Occupational History    Occupation: psychology fellow     Employer: [employer]   Tobacco Use    Smoking status: Never    Smokeless tobacco: Never   Substance and Sexual Activity    Alcohol use: Yes     Alcohol/week: 0.6 oz     Types: 1 Glasses of Wine (5 oz) per week     Comment: social     Drug use: No    Sexual activity: Yes     Partners: Male     Birth control/protection: Withdrawal / Coitus Interruptus   Other Topics Concern    Do you exercise at least a day, 3 or more days a week? Yes    Do you follow  a special diet? No    Vegan? No    Vegetarian? No    Pescatarian? No    Lactose Free? No    Gluten Free? No    Omnivore? No    Types of Exercise? (List in Comments) Yes     Comment: Walking/hiking, home workouts   Social History Narrative    Diet: omnivore. meat rarely. Fast food, none. Soda, none. +veggies +fruits.    Exercise: hiking, home workouts. 3+ days per week.    Alcohol: once weekly, 2 drinks on average.    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    Physical Exam:  There were no vitals taken for this visit.    Physical examination:  Gen:  CV:  Pulm:  Abd:  Ext:  Pelv:  ***Deferred secondary to telehealth consultation    Labs:  02/02/23  CBC: 12.9/38.3<249  Cr: 0.67  AST/ALT: 25/13  Anti-cardiolipin Ab: IgA 22.7 CU, IgG and IgM negative  LAC: negative  Beta-2 glycoprotein: IgM 23 CU, IgG and IgA negative    02/14/22  SSA Ab - positive (90 U)  SSB Ab - negative     02/14/22   Anti-cardiolipin Ab: IgA 24.4 CU, IgG and IgM negative  LAC: negative  Beta-2 glycoprotein: IgM 31 CU, IgG and IgA negative    P:Cr: 0.6 (02/09/22) --> 0.1 (02/19/22)    Imaging:  ***    Assessment/Plan: [name] [name] is a 37 y.o. year-old No obstetric history on file. who presents for preconception consultation for:    Counseling:  ***  - Diagnosed ***  - Flares:   - SSA/SSB ***  - APS ***  - Current meds: ***  Counseling:  - If SSA/SSB positive, neonatal lupus occurs in 1-2% due to passage of antibodies through the placenta. It is transient, lasting up to 14-16 weeks and manifestations include cutaneous, hematologic, hepatic, and cardiac.   - Risk of fetus developing congenital heart block is 2% in pregnancies positive for SSA/SSB antibodies. This risk increases to 10-15% if prior child with cutaneous lupus and 19% if prior child with congenital heart block/cardiac neonatal lupus manifestations. Most likely to occur between 18-24 weeks of gestation.   - Azathioprine and hydroxychloroquine are safe and effective in pregnancy. If stable on either of these medications without recent flares, it is recommended to continue them in pregnancy and postpartum. They can also be started in pregnancy as needed.  Pregnancy recommendations:  - ASA 81 mg daily for prevention of preeclampsia  - Fetal echocardiogram and serial PR interval surveillance, weekly from 16-26 weeks (if SSA/SSB positive ***)  - Serial growth ultrasounds in third trimester   - Weekly ANT starting at 32 weeks  - IOL at [redacted]w[redacted]d-[redacted]w[redacted]d for SLE, if no earlier indications/complications    2. Anti-beta2 glycoprotein Ab positive   - Reviewed with patient antiphospholipid syndrome (APS), an autoimmune disorder defined by the presence of characteristics clinical features and specified levels of circulating APS antibodies   - Diagnosis requires at least one clinical criteria and one laboratory criteria  Clinical criteria:      1. Vascular thrombosis: arterial, venous, or small vessel thrombosis in any tissue or organ      2. Pregnancy morbidity:           -  Unexplained IUFD of morphologically normal fetus >/= 10 weeks           - 3 or more unexplained consecutive SABs < 10 weeks            - Preterm birth < 34 weeks for preeclampsia with severe features or signs of placental insufficiency  Laboratory criteria: positive antibodies (lupus anticoagulant, anticardiolipin, beta-2 glycoprotein) on 2 occasions at least 12 weeks apart and within a 5 year time period   - Discussed risks associated with APS  Maternal complications of APS: venous and arterial thromboembolism, autoimmune thrombocytopenia, preeclampsia  Fetal complications of APS: pregnancy loss and fetal death, FGR, preterm birth  - For management of APS, we recommend prophylactic anticoagulation (Lovenox 40 mg daily) + ASA 81 mg daily throughout pregnancy and for 6 weeks postpartum  - Prophylactic anticoagulation decreases risk of VTE but has not been shown to decrease risk of pregnancy complications of APS   - Serial growth ultrasounds in third trimester  - ANT twice weekly starting at 32 weeks for APS       Ab positive  - Reviewed that patient currently does not meet full criteria for APS. Meets laboratory criteria with anticardiolipin Ab IgG positive but no positive clinical criteria. Although patient had 14 wk loss, it is not considered unexplained/morphologically normal fetus in setting of mono-di twins/evolving TTTS.   - Options for management: 1) Treat as presumed APS with Lovenox 40 mg daily + ASA 81 mg daily in pregnancy and 6 week postpartum, 2) Treat as APS antibody positive and monitor without prophylactic anticoagulation.   - We discussed risks and benefits of prophylactic anticoagulation in pregnancy. Benefits are primarily to decrease potential risk of VTE as has not been shown to decrease risk of adverse pregnancy outcomes. Risks of anticoagulation include vaginal bleeding, PPH, and epidural hematoma with neuraxial anesthesia placement.   - Neuraxial anesthesia can only be offered after 6-12 hours after last dose of prophylactic UFH or 12 hours after last dose of prophylactic Lovenox due to potential risk of epidural hematoma.   - If patient opts for treatment of APS, options for management of anticoagulation at time of delivery include: 1) Transitioning to UFH at 36-37 weeks due to its shorter half-life and potential decreased bleeding risks and increased likelihood of eligibility for neuraxial anesthesia, 2) Continue Lovenox with scheduled delivery at 39 weeks and holding Lovenox 12 hours prior to delivery.       Summary of Recommendations:  There are no absolute contraindications to attempting a pregnancy.  We discussed my strong recommendations that she not attempt to carry a pregnancy due to the risks mentioned above.     Before pregnancy:  Medications:  Labs:  Healthcare maintenance:    Once she conceives:   Medications    Referrals:    Labs:    Maternal surveillance    Surveillance schedule:  6-8 weeks: Early dating and viability.  11-13.[redacted] weeks GA: Nuchal translucency  20 weeks: Detailed anatomical survey  >__ weeks: Serial growth ultrasounds  >__ weeks: Antenatal testing  __ weeks: Delivery.     At the end of our discussion, [name] [name] indicated that her questions were answered and she seemed satisfied with our discussion.     At the end of our discussion, [name] [name] and her partner indicated that their questions were answered she seemed satisfied with our discussion.      {Blank single:19197::''10'',''25'',''15''} minutes were spent with the patient of which at least 50% was  spent on planning and coordination of care.    Casimer Russett C. Ardelia Mems, MD    Patient Consent to Telehealth   The patient agreed to participate in the video visit prior to joining the visit.

## 2023-02-23 NOTE — Telephone Encounter
Left a vm to move her VV today to 09/03 with Dr. Andrey Cota as Dr. Ardelia Mems will be covering Dr. Greig Right clinic.

## 2023-02-25 ENCOUNTER — Ambulatory Visit: Payer: Self-pay

## 2023-02-25 NOTE — Telephone Encounter
Left the pt a vm stating that Dr. Cyndie Chime will not be available on 9/3 at 2:00 pm; however, I am able to reschedule her to 3:00 pm with Dr. Katha Cabal. Otherwise, Dr. Cyndie Chime is also willing to accommodate the patient on 9/9 at 11:00 am.

## 2023-02-25 NOTE — Telephone Encounter
Patient had called back and her video visit was rescheduled to 9/9 at 11:00 am.

## 2023-03-01 ENCOUNTER — Ambulatory Visit: Payer: Self-pay

## 2023-03-07 ENCOUNTER — Telehealth: Payer: Self-pay

## 2023-03-07 NOTE — Consults
Date of Consult: 03/09/2023    Referring Provider: Cheryl Flash, MD    PCP: Resa Miner., MD    Reason for consult: preconception, has lab features of antiphospholipid syndrome, no clinical features. Known Raynaud's     [name] [name] is a 37 y.o. G0P0 who presents for consult regarding pregnancy in the setting of lab features of antiphospholipid syndrome (APLS) not no clinical features.     Currently, she reports feeling well. She and her partner will start trying to conceive soon.     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 No longer present    Seasonal allergies     Sexually transmitted infection     Herpes       Past Surgical History:   Procedure Laterality Date    WISDOM TOOTH EXTRACTION         OB History    No obstetric history on file.         PGYNH: HSV    Outpatient Medications Prior to Visit   Medication Sig    ketoconazole 2% cream Apply topically daily.    triamcinolone 0.025% cream Apply to affected areas twice daily as needed. Avoid face, axillae and groin..    valACYclovir 500 mg tablet Take 1 tablet (500 mg total) by mouth two (2) times daily For 3 days.     No facility-administered medications prior to visit.       No Known Allergies    Family History   Problem Relation Age of Onset    Lupus Mother     Arthritis Mother     Scleroderma Father     Arthritis Father     Breast cancer Maternal Grandmother 51    Gout Maternal Grandfather     Stroke Paternal Grandmother     Arthritis Paternal Grandmother     Skin cancer Paternal Grandfather     Stroke Paternal Grandfather     Breast cancer Maternal Great-Grandmother        Social History     Socioeconomic History    Marital status: Single   Occupational History    Occupation: psychology fellow     Employer: [employer]   Tobacco Use    Smoking status: Never    Smokeless tobacco: Never   Substance and Sexual Activity    Alcohol use: Yes     Alcohol/week: 0.6 oz     Types: 1 Glasses of Wine (5 oz) per week     Comment: social     Drug use: No    Sexual activity: Yes     Partners: Male     Birth control/protection: Withdrawal / Coitus Interruptus   Other Topics Concern    Do you exercise at least a day, 3 or more days a week? Yes    Do you follow a special diet? No    Vegan? No    Vegetarian? No    Pescatarian? No    Lactose Free? No    Gluten Free? No    Omnivore? No    Types of Exercise? (List in Comments) Yes     Comment: Walking/hiking, home workouts   Social History Narrative    Diet: omnivore. meat rarely. Fast food, none. Soda, none. +veggies +fruits.  Exercise: hiking, home workouts. 3+ days per week.    Alcohol: once weekly, 2 drinks on average.    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     There were no vitals filed for this visit.    Exam: Deferred 2/2 to video visit    Labs:  02/02/23  CBC: 12.9/38.3<249  Cr: 0.67  AST/ALT: 25/13  Anti-cardiolipin Ab: IgA 22.7 CU, IgG and IgM negative  LAC: negative  Beta-2 glycoprotein: IgM 23 CU, IgG and IgA negative    02/14/22  SSA Ab - positive (90 U)  SSB Ab - negative     02/14/22   Anti-cardiolipin Ab: IgA 24.4 CU, IgG and IgM negative  LAC: negative  Beta-2 glycoprotein: IgM 31 CU, IgG and IgA negative    P:Cr: 0.6 (02/09/22) --> 0.1 (02/19/22)    A/P: [name] [name] is a 37 y.o. G0P0 who presents for consult regarding potential pregnancy in the setting of lab features of APLS and known Raynaud's.     We discussed the following    1) Lab indicators of Antiphospholipid Syndrome, no clinical features - The patient has been tested twice at least 12 weeks apart. We discussed that she does not have any clinical manifestations of APLS but is at higher risk of issues/complications during pregnancy.     We discussed 2 options for management:     1) Treat as presumed APS with Lovenox 40 mg daily + ASA 81 mg daily in pregnancy and 6 week postpartum    2) Treat as APS antibody positive and monitor without prophylactic anticoagulation.     We discussed risks and benefits of prophylactic anticoagulation in pregnancy. Benefits are primarily to decrease potential risk of VTE as has not been shown to decrease risk of adverse pregnancy outcomes. Risks of anticoagulation include vaginal bleeding, PPH, and epidural hematoma with neuraxial anesthesia placement.     Neuraxial anesthesia can only be offered after 6-12 hours after last dose of prophylactic UFH or 12 hours after last dose of prophylactic Lovenox due to potential risk of epidural hematoma.     If the patient opts for no prophylactic anticoagulation, will consider postpartum anticoagulation if C/Section is mode of delivery.      If the patient opts for treatment of APS, options for management of anticoagulation at time of delivery include: 1) Transitioning to UFH at 36-37 weeks due to its shorter half-life and potential decreased bleeding risks and increased likelihood of eligibility for neuraxial anesthesia, 2) Continue Lovenox with scheduled delivery at 39 weeks and holding Lovenox 12 hours prior to delivery.     2) Raynaud's - SSA Ab Positive - RETEST WHEN PREGNANCY CONFIRMED. If positive, discussed the risks of cardiac arrhythmias and heart block due to the passage of SSA antibodies. If SSA/SSB positive, neonatal lupus occurs in 1-2% due to passage of antibodies through the placenta. It is transient, lasting up to 14-16 weeks and manifestations include cutaneous, hematologic, hepatic, and cardiac.  We discussed the risk of fetus developing congenital heart block is 2% in pregnancies positive for SSA/SSB antibodies. This risk increases with each pregnancy if the prior pregnancy was affected.     Given known Raynaud's and possible APLS, the patient should consider starting ASA 81mg  during conception. The latest she should start ASA 81mg  is a 12 weeks.  3) Advanced Maternal Age - Advanced maternal age - The patient is currently 37 y.o. years old. We discussed the effects of age alone on her pregnancy, including but not limited to increased risks of spontaneous abortion, ectopic pregnancy and chromosomal abnormalitiesand possible increased risks of congential anomalies. We discussed that NIPT could be done after pregnancy was confirmed, ultrasounds are standard and amniocentesis was also an option.   We also discussed late pregnancy issues including but not limited to coexisting medical problems, Raynaud's as above, diabetes, placental issues, preterm delivery, low birth weight, need for IOL or C/Section and stillbirth. We discussed the increased monitoring during pregnancy.     Consider Weekly ANT starting at 32 - 34 weeks.    Consider IOL at [redacted]w[redacted]d-[redacted]w[redacted]d if no earlier indications/complications    I spent 30 minutes with the patient. She acknowledged full understanding of all issues discussed and had appropriate time to ask questions.     Thank you for allowing me to participate in the care of your patient.     Please do not hesitate to contact me if there are any questions or concerns.     Nelia Shi, MD prophylactic UFH or 12 hours after last dose of prophylactic Lovenox due to potential risk of epidural hematoma.   - If patient opts for treatment of APS, options for management of anticoagulation at time of delivery include: 1) Transitioning to UFH at 36-37 weeks due to its shorter half-life and potential decreased bleeding risks and increased likelihood of eligibility for neuraxial anesthesia, 2) Continue Lovenox with scheduled delivery at 39 weeks and holding Lovenox 12 hours prior to delivery.

## 2023-03-07 NOTE — Consults
TTC     FH: autoimmune issues - mom with sle vs sjgrens, dad with scleradema    PMH: Raynaud's - brought on by cold. Even getting water from the fridge. Extremities.     No personal history of VTE

## 2023-03-10 DIAGNOSIS — R760 Raised antibody titer: Secondary | ICD-10-CM

## 2023-03-21 ENCOUNTER — Telehealth: Payer: BLUE CROSS/BLUE SHIELD

## 2023-03-21 ENCOUNTER — Ambulatory Visit: Payer: BLUE CROSS/BLUE SHIELD | Attending: Student in an Organized Health Care Education/Training Program

## 2023-03-21 DIAGNOSIS — L700 Acne vulgaris: Secondary | ICD-10-CM

## 2023-03-21 DIAGNOSIS — D229 Melanocytic nevi, unspecified: Secondary | ICD-10-CM

## 2023-03-21 DIAGNOSIS — D1801 Hemangioma of skin and subcutaneous tissue: Secondary | ICD-10-CM

## 2023-03-21 DIAGNOSIS — E755 Other lipid storage disorders: Secondary | ICD-10-CM

## 2023-03-21 MED ORDER — CLINDAMYCIN PHOSPHATE 1 % EX SOLN
4 refills | Status: AC
Start: 2023-03-21 — End: ?

## 2023-03-21 NOTE — Progress Notes
Dermatology Attending Note    PATIENT: [name] [name]  MRN: [id number]  DOB: March 04, 1986  DATE OF SERVICE: 03/21/2023    CHIEF COMPLAINT:   Chief Complaint   Patient presents with    General Skin Check       Subjective:     [name] [name] is a 37 y.o. female presenting for skin concerns.       Patient is here for evaluation of concerning areas on skin and a total body skin exam.     Breaking out on back recently.     History of recurrent itchy rash x 4 times, which resolved. Thinks the first one was ringworm.    Hx of positive SSA, B2-glycoprotein and anti-cardiolipin   PMH: No personal hx of skin cancer    Medications that the patient states to be currently taking   Medication Sig    valACYclovir 500 mg tablet Take 1 tablet (500 mg total) by mouth two (2) times daily For 3 days.     No Known Allergies    Objective:     Full skin exam performed except genitalia per patient request.  (Offered chaperone but patient declined.)  All areas examined were within normal limits with the following exceptions:    Upper back with inflammatory papules  Head/neck, trunk, extremities with scattered 2-31mm brown macules and papules with uniform color, symmetry, regular borders and regular pigment network under dermoscopy  Cherry-colored papules scattered on trunk and extremities  Right palm with yellow-colored papules    Assessment/Plan     # Acne vulgaris  - clindamycin 1% external solution; APPLY SPARINGLY INTO AFFECTED AREA(S) on back TWICE DAILY.  Dispense: 60 mL; Refill: 4      # Cherry angioma  - trunk and extremities  - benign  - reassured    # Benign nevus  - multiple on trunk and extremities  - Discussed signs of skin cancer,  - Counseled on UV protection    # Sebaceous cyst vs xanthoma  - right palm  - will monitor    # Acral nevus  - right heel  - photo taken  - will monitor  --------------------------------------------------------------------------------------------------------------------------------------------  The above diagnosis and treatment options were reviewed with the patient.  We recommended sunscreen and sun-protective clothing on a daily basis.  The patient was informed of the major side effects associated with the above medication(s) and recommended to read the package inserts. Education was provided on the correct use of the medications and over the counter products, and the need to use these on a regular basis in order to achieve the best results.         RTC 12 months , or as needed sooner for any new, changing, evolving, or concerning lesions or symptoms.        Gevena Cotton, MD, PhD 03/21/2023 1:48 PM

## 2023-03-21 NOTE — Telephone Encounter
Reviewed GC kit testing with pt. Kits prepared for both patients while in office.

## 2023-03-21 NOTE — Telephone Encounter
Call Back Request      Reason for call back: patient is requesting to speak with Marylu Lund regarding a message sent to her on 02/25/23 regarding the genetic testing. Patient would like to move forward with that and would like to speak with Marylu Lund today. Please advise. Thank you     Any Symptoms:  []  Yes  [x]  No      If yes, what symptoms are you experiencing:    Duration of symptoms (how long):    Have you taken medication for symptoms (OTC or Rx):      If call was taken outside of clinic hours:    [] Patient or caller has been notified that this message was sent outside of normal clinic hours.     [] Patient or caller has been warm transferred to the physician's answering service. If applicable, patient or caller informed to please call us back if symptoms progress.  Patient or caller has been notified of the turnaround time of 1-2 business day(s).

## 2023-04-04 ENCOUNTER — Ambulatory Visit: Payer: Self-pay

## 2023-04-04 DIAGNOSIS — A6004 Herpesviral vulvovaginitis: Secondary | ICD-10-CM

## 2023-04-04 DIAGNOSIS — E7800 Pure hypercholesterolemia, unspecified: Secondary | ICD-10-CM

## 2023-04-04 DIAGNOSIS — K5909 Other constipation: Secondary | ICD-10-CM

## 2023-04-04 DIAGNOSIS — R233 Spontaneous ecchymoses: Secondary | ICD-10-CM

## 2023-04-04 DIAGNOSIS — K644 Residual hemorrhoidal skin tags: Secondary | ICD-10-CM

## 2023-04-04 DIAGNOSIS — Z0000 Encounter for general adult medical examination without abnormal findings: Secondary | ICD-10-CM

## 2023-04-04 MED ORDER — HYDROCORTISONE (PERIANAL) 2.5 % EX CREA
Freq: Three times a day (TID) | RECTAL | 3 refills | Status: AC | PRN
Start: 2023-04-04 — End: ?

## 2023-04-04 MED ORDER — VALACYCLOVIR HCL 500 MG PO TABS
500 mg | ORAL_TABLET | Freq: Two times a day (BID) | ORAL | 2 refills | Status: AC
Start: 2023-04-04 — End: ?

## 2023-04-04 NOTE — Assessment & Plan Note
04/04/2023 Stable. Sometimes up to 5 days. Managing with fiber supp and occasional laxatives. She will schedule with GI as ordered by Dr. Alanson Aly.

## 2023-04-04 NOTE — Assessment & Plan Note
04/04/2023 Noted on prior exam 12/2021. Occasionally inflamed. Start hydrocortisone rectal prn.  Discussed behavioral modifications.

## 2023-04-04 NOTE — Progress Notes
[facility]  [address], [address]  [place] [address]  Phone: [phone]  Fax: [phone]    Internal Medicine Annual Wellness Visit  Provider/Author: Leta Baptist, MD  Patient's Name: [name] [name]  MRN: [id number]  DOB: [birth year]  Visit Date:04/04/2023    Chief Complaint: Annual Exam    HPI     ''I feel like I get sick often.''  Sore throat over the weekend. Improved with a nap.  Resolved today.    Easy bruising.  Occasional gum bleeding.    ASSESSMENT AND PLAN:     1. Wellness examination    2. Pure hypercholesterolemia    3. Chronic constipation    4. External hemorrhoid    5. Easy bruising    6. Herpes simplex vulvovaginitis         Problem List Items Addressed This Visit       Hyperlipidemia     Diet controlled. Not in statin benefit group  - low cholesterol, moderate carb, heart healthy diet, and exercise at least 150 min/week           Chronic constipation     04/04/2023 Stable. Sometimes up to 5 days. Managing with fiber supp and occasional laxatives. She will schedule with GI as ordered by Dr. Alanson Aly.         External hemorrhoid     04/04/2023 Noted on prior exam 12/2021. Occasionally inflamed. Start hydrocortisone rectal prn.         Relevant Medications    hydrocortisone 2.5% rectal cream    Herpes genitalis    Relevant Medications    valACYclovir 500 mg tablet     Other Visit Diagnoses       Wellness examination    -  Primary    Easy bruising        Relevant Orders    Ferritin    Iron & Iron Binding Capacity    Factor V Leiden Mutation            Follow Up: Return in about 1 year (around 04/03/2024) for or sooner as needed.    The above plan of care including diagnosis, orders, and follow-up were discussed with the patient, who expressed understanding and agreement. The patient was given the opportunity to ask questions which were answered satisfactorily during the visit.    ANNUAL WELLNESS VISIT   Chandni Gagan is a 37 y.o. female who is here for their annual exam:    Social History Social History Narrative    Diet: omnivore. meat rarely. Fast food, none. Soda, none. +veggies +fruits.    Exercise: electric biking. Walking. VR workouts. home workouts. 3+ days per week.    Alcohol: 1-2 weekly on weekends, 2 drinks on average.    Occupation: psychologist at [employer]    Sleep: improved, but still some difficulties, mostly regarding temperature      Blood pressure: acceptable  Weight: acceptable. Weight Change (lbs): -1.8      Vaccinations discussed. Defers Flu and COVID vaccination today.  Cancer Screening discussed.  Labs/imaging/procedures/vaccinations are as ordered.    Counseling/Screening:  I counseled on diet and exercise.  I counseled on routine dental care.  I counseled on sunscreen use.    PHQ-9 total score: Total Score: 0 (04/03/23 1837)  GAD-7 total score: Total Score: 0 (04/03/23 1837)  DAST total score:Total Score: 0 (04/03/23 1837)  Audit-C total score: Total Score: 2 (04/03/23 1837)     Health Maintenance  Topic Date Due    Influenza Vaccine (1) 02/27/2023    COVID-19 Vaccine(Tracks primary and booster doses, not sup/immunocomp) (4 - 2024-25 season) 02/27/2023    Cervical Cancer Screening: HPV Testing  04/29/2024    Cervical Ca Screening: PAP Smear  04/29/2024    Tdap/Td Vaccine (2 - Td or Tdap) 12/28/2026    Hepatitis B Screening  Completed    Hepatitis C Screening  Completed    HIV Screening  Completed        Prob List     Patient Active Problem List   Diagnosis    Raynaud phenomenon    Globus sensation    Heat intolerance    Lumbar disc disease with radiculopathy    Hyperlipidemia    Herpes genitalis    Paresthesia of arm    Anti-cardiolipin antibody positive    Chronic constipation    External hemorrhoid       PMH     Past Medical History:   Diagnosis Date    Chicken pox     GERD (gastroesophageal reflux disease)     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 No longer present Seasonal allergies     Sexually transmitted infection     Herpes       PSxH     Past Surgical History:   Procedure Laterality Date    WISDOM TOOTH EXTRACTION         ALL   No Known Allergies    MEDS     Medications that the patient states to be currently taking   Medication Sig    clindamycin 1% external solution APPLY SPARINGLY INTO AFFECTED AREA(S) on back TWICE DAILY.    [DISCONTINUED] valACYclovir 500 mg tablet Take 1 tablet (500 mg total) by mouth two (2) times daily For 3 days.       [facility]     Family History   Problem Relation Age of Onset    Lupus Mother     Arthritis Mother     Scleroderma Father     Arthritis Father     Breast cancer Maternal Grandmother 65    Gout Maternal Grandfather     Stroke Paternal Grandmother     Arthritis Paternal Grandmother     Skin cancer Paternal Grandfather     Stroke Paternal Grandfather     Breast cancer Maternal Great-Grandmother      Social History     Tobacco Use    Smoking status: Never    Smokeless tobacco: Never   Substance Use Topics    Alcohol use: Yes     Alcohol/week: 0.6 oz     Types: 1 Glasses of Wine (5 oz) per week     Comment: social        ROS   []  10-point ROS reviewed: normal unless stated in HPI            []  reviewed questionnaire on 04/04/2023 (updated in chart)        04/03/2023     6:38 PM 04/07/2022     8:42 AM 04/10/2020    10:06 AM   Review of Systems   Fevers No No No   Unusual fatigue No No No   Very sudden vision change No No No   Eye pain or irritation No No No   Runny nose No No No   Sore throat Yes  No No   Chest pain No No No   Swelling of legs/ankles No No No   Cough Yes No No   Shortness of breath No No No   Nausea No No No   Vomiting No No No   Abdominal pain No No No   Diarrhea No No No   Constipation Yes Yes No   Genital lesions No No No   Unusual discharge No No No   Pain on urination No No No   Body aches No Yes No   Joint aches No Yes No   Skin lesions No No No   Rash No No No   Weakness No No No   Numbness No Yes No   Depression No No No   Anxiety No No No   Weight change No No No   Hair loss No No No   Unusually frequent urination No No No   Bleeding No No No   Swollen lymph nodes (glands) No No No   Allergies No No No   Unusually frequent infections No No No        PHYSICAL EXAM     Vitals:    04/04/23 1530   BP: 111/77   Pulse: 71   Temp: 37.1 ?C (98.7 ?F)   SpO2: 98%   Weight: 126 lb (57.2 kg)   Height: 5' 3'' (1.6 m)     Body mass index is 22.32 kg/m?Marland Kitchen    Physical Exam  Vitals and nursing note reviewed.   Constitutional:       General: She is awake. She is not in acute distress.     Appearance: Normal appearance. She is well-developed. She is not ill-appearing.   HENT:      Head: Normocephalic and atraumatic.      Right Ear: Hearing normal.      Left Ear: Hearing normal.      Nose: No congestion or rhinorrhea.      Mouth/Throat:      Mouth: Mucous membranes are moist.      Pharynx: Oropharynx is clear.   Eyes:      General: Lids are normal.      Extraocular Movements: Extraocular movements intact.      Conjunctiva/sclera: Conjunctivae normal.      Pupils: Pupils are equal, round, and reactive to light.   Neck:      Thyroid: No thyroid mass, thyromegaly or thyroid tenderness.      Trachea: Trachea normal.   Cardiovascular:      Rate and Rhythm: Normal rate and regular rhythm.      Pulses:           Radial pulses are 2+ on the right side and 2+ on the left side.      Heart sounds: Normal heart sounds.   Pulmonary:      Effort: Pulmonary effort is normal.      Breath sounds: Normal breath sounds.   Abdominal:      General: Bowel sounds are normal.      Palpations: Abdomen is soft.   Musculoskeletal:         General: No deformity. Normal range of motion.      Cervical back: Normal range of motion and neck supple.      Thoracic back: No deformity or bony tenderness.      Lumbar back: No deformity or bony tenderness.      Right lower leg: No edema.      Left lower leg:  No edema.   Lymphadenopathy:      Cervical: No cervical adenopathy. Upper Body:      Right upper body: No supraclavicular adenopathy.      Left upper body: No supraclavicular adenopathy.   Skin:     General: Skin is warm and dry.      Findings: No rash.   Neurological:      General: No focal deficit present.      Mental Status: Mental status is at baseline.      Cranial Nerves: Cranial nerves 2-12 are intact.   Psychiatric:         Mood and Affect: Mood and affect normal.         Behavior: Behavior normal.         Cognition and Memory: Cognition normal.       LABS/STUDIES      Lab Studies:  Lab Visit on 03/21/2023   Component Date Value Ref Range Status    Measles Ab Immune Status 03/21/2023 Positive  Negative Final    SUGGESTS IMMUNE STATUS      Rubella Ab Immune Status 03/21/2023 Positive  Negative Final    Suggests immune status      VZV Ab Immune Status 03/21/2023 Positive  Negative Final    SUGGESTS IMMUNE STATUS      State Law 03/21/2023 Notify patient   Final    State law requires that the woman tested be informed of the Rhesus (Rh or D) typing test results.    ABORh 03/21/2023 B Positive   Final    Antibody Screen 03/21/2023 Negative   Final    Specimen Draw / Processing 03/21/2023 Collected   Final   Office Visit on 02/02/2023   Component Date Value Ref Range Status    HIV-1/2 Ag/Ab Screen 4th Generation 02/02/2023 Nonreactive  Nonreactive Final    This test was performed using the Elecsys HIV Duo assay on a Roche e801 analyzer.    HIV-1/2 Ag/Ab Screen 4th Generation 02/02/2023 Nonreactive  Nonreactive Final    This test was performed using the Elecsys HIV Duo assay on a Roche e801 analyzer.    HCV Ab Screen 02/02/2023 Nonreactive  Nonreactive Final    HBs Ag 02/02/2023 Nonreactive  Nonreactive Final    Antinuclear Ab 02/02/2023 <1:40  <1:40 titer Final      Antinuclear Antibody performed by IFA method.      ANA is a screening test for detection of autoantibodies  against various cell nuclear proteins. Some of these  antibodies appear to have diagnostic and/or prognostic  significance in Systemic Lupus Erythematosus, progressive  systemic sclerosis, mixed connective tissue disease,  Sjogren's syndrome, polymyositis, and/or rheumatoid  arthritis.  Titers of 1:40 - 1:80 may be found in healthy adults.    Scleroderma (Scl-70) (ENA) Antibod* 02/02/2023 0  0 - 40 AU/mL Final    Comment: INTERPRETIVE INFORMATION: Scleroderma (Scl-70) (ENA) Ab, IgG      29 AU/mL or Less ............. Negative    30 - 40 AU/mL ................ Equivocal    41 AU/mL or Greater .......... Positive    The presence of Scl-70 antibodies (also referred to as   topoisomerase I, topo-I or ATA) is considered diagnostic for   systemic sclerosis (SSc). Scl-70 antibodies alone are detected in   about 20 percent of SSc patients and are associated with the   diffuse form of the disease, which may include specific organ   involvement and poor prognosis. Scl-70 antibodies have also been  reported in a varying percentage of patients with systemic lupus   erythematosus (SLE). Scl-70 (topo-1) is a DNA binding protein and   anti-DNA/DNA complexes in the sera of SLE patients may bind to   topo-I, leading to a false-positive result. The presence of Scl-70   antibody in sera may also be due to contamination of recombinant   Scl-70 with DNA derived from cellular material used in   immunoassays. Strong clinical                            correlation is recommended if both   Scl-70 and dsDNA antibodies are detected.     Negative results do not necessarily rule out the presence of SSc.   If clinical suspicion remains, consider further testing for   centromere, RNA polymerase III and U3-RNP, PM/Scl, or Th/To   antibodies.  Performed By: [facility]  [address]  [place], [place] [address]  Laboratory Director: Evans Lance, MD, PhD  CLIA Number: [id number]    RPR with TP-PA Confirmation 02/02/2023 Nonreactive  Nonreactive Final    TSH 02/02/2023 1.1  0.3 - 4.7 mcIU/mL Final    TSH is normal, no further Thyroid tests were performed.    If applicable TSH pregnancy reference intervals:   First trimester (10-[redacted] weeks gestation): 0.03- 4.0 mcIU/mL  Second trimester (14-[redacted] weeks gestation): 0.19- 4.0 mcIU/mL      Hgb A1c 02/02/2023 5.3  <5.7 % Final    For patients with diabetes, an A1c less than (<) or equal (=) to 7.0% is recommended for most patients, however the goal may be higher or lower depending on age and/or other medical problems.     For a diagnosis of diabetes, A1c greater than (>) or equal(=) to 6.5% indicates diabetes; values between 5.7% and 6.4% may indicate an increased risk of developing diabetes.    Hemoglobin A1c is determined with Roche Cobas immunoassay. The presence of abnormal hemoglobin variants, Thalassemia, and increased concentration of HbF (>7%), may result in lower HbA1c values.    Cholesterol 02/02/2023 180  See Comment mg/dL Final    The significance of total cholesterol depends on the values of LDL, HDL, triglycerides and the clinical context. A patient-provider discussion may be considered.        Cholesterol,LDL,Calc 02/02/2023 108 (H)  <100 mg/dL Final    If LDL value falls outside of the designated range AND if  included in any of the following categories, a  patient-provider discussion is recommended.     Statin therapy is recommended for individuals:  1. with clinical atherosclerotic cardiovascular disease     irrespective of LDL levels;  2. with LDL > or = 190 mg/dL;  3. with diabetes, aged 40-75 years, with LDL between 70 and     189 mg/dL;  4. without any of the above but who have LDL between 70 and     189 mg/dL and an estimated 88-CZYS risk of     atherosclerotic cardiovascular disease > or = 7.5%     (consider statin therapy if estimated 10-year risk > or =     5.0%) (ACC/AHA 2013 Guidelines).    Cholesterol, HDL 02/02/2023 65  >50 mg/dL Final    If HDL cholesterol level falls outside of the designated  range, a patient-provider discussion is recommended    Triglycerides 02/02/2023 37  <150 mg/dL Final    If Triglyceride level falls outside of the designated  range,  a patient-provider discussion is recommended.      Non-HDL,Chol,Calc 02/02/2023 115  <130 mg/dL Final    If Non-HDL cholesterol level falls outside of the designated  range, a patient-provider discussion is recommended.    Sodium 02/02/2023 137  135 - 146 mmol/L Final    Potassium 02/02/2023 4.2  3.6 - 5.3 mmol/L Final    Chloride 02/02/2023 101  96 - 106 mmol/L Final    Total CO2 02/02/2023 25  20 - 30 mmol/L Final    Anion Gap 02/02/2023 11  8 - 19 mmol/L Final    Anion gap (AG) is calculated using AG = Na -(Cl+CO2). Variations in the measurements of sodium, chloride, and CO2 can affect AG results.   In patients with hypoalbuminemia, the AG can be ''corrected'' by adding 2.5 to the calculated anion gap for every 1 g/dL decrease in albumin concentration.    Glucose 02/02/2023 77  65 - 99 mg/dL Final    Creatinine 16/03/9603 0.67  0.60 - 1.30 mg/dL Final    Estimated GFR 02/02/2023 >89  See GFR Additional Information mL/min/1.21m2 Final    GFR Additional Information 02/02/2023 See Comment   Final    GFR >89.........Marland KitchenNormal   GFR 60 - 89...Marland KitchenMarland KitchenNormal to mildly decreased   GFR 45 - 59...Marland KitchenMarland KitchenMildly to moderately decreased   GFR 30 - 44...Marland KitchenMarland KitchenModerately to severely decreased   GFR 15 - 29...Marland KitchenMarland KitchenSeverely decreased   GFR <15.........Marland KitchenKidney failure   The 2021 CKD-EPI creatinine equation was used to calculate the estimated GFR and assumes stable creatinine concentrations.   Results are in mL/min/1.73 square meters.  The patient's eGFR MAY need to be adjusted for drug dosing.   For drug dosing, utilize eGFR or eCrCl.   If using the eGFR in very large or very small patients, then multiply the reported eGFR by the estimated BSA and divide by 1.73 m2, in order to obtain eGFR in units of mL/min.    Urea Nitrogen 02/02/2023 16  7 - 22 mg/dL Final    Calcium 54/02/8118 9.6  8.6 - 10.4 mg/dL Final    Total Protein 02/02/2023 7.2  6.1 - 8.2 g/dL Final    Albumin 14/78/2956 4.2  3.9 - 5.0 g/dL Final    Bilirubin,Total 02/02/2023 0.4  0.1 - 1.2 mg/dL Final    Alkaline Phosphatase 02/02/2023 50  37 - 113 U/L Final    Aspartate Aminotransferase 02/02/2023 25  13 - 62 U/L Final    Alanine Aminotransferase 02/02/2023 13  8 - 70 U/L Final    Beta-2-Glycoprotein IgG 02/02/2023 <10  <=20 SGU Final    Beta-2-Glycoprotein IgM 02/02/2023 23 (H)  <=20 SMU Final    Comment: INTERPRETIVE INFORMATION: B2Glycoprotein I, IgG and IgM Antibody    The persistent presence of IgG and/or IgM beta 2 glycoprotein I   (B2GPI) antibodies is a laboratory criterion for the diagnosis of   antiphospholipid syndrome (APS). Persistence is defined as   moderate or high levels of IgG and/or IgM B2GPI antibodies   detected in two or more specimens drawn at least 12 weeks apart (J   Throm Haemost. 2006;4:295-306). B2GPI results greater than 20 SGU   (IgG) and/or SMU (IgM) are considered positive based on the cutoff   values established for this test. International reference   materials and consensus units for anti-B2GPI antibodies have not   been established (Clin Chim Acta. 2012;413(1-2):358-60; Arthritis   Rheum. 2012;64(1):1-10.); results can be variable between   different commercial immunoassays and cannot be compared.  Strong   clinical correlation is recommended for a diagnosis of APS. Low   positive IgG and IgM B2GPI antibody levels should be interpreted   in light of                            APS-specific clinical manifestations and/or other   criteria phospholipid antibody tests.    Beta-2-Glycoprotein IgA 02/02/2023 <10  <=20 SAU Final    Performed By: [facility]  [address]  [place], [place] [address]  Laboratory Director: Evans Lance, MD, PhD  CLIA Number: [id number]    DRVVT Interpretation 02/02/2023 Negative  Negative Final    Interpret with caution if patient is on anticoagulants, anti-Xa or direct thrombin inhibitors.    Cardiolipin IgG 02/02/2023 <20.0  <=20.0 CU Final    Cardiolipin IgM 02/02/2023 <20.0  <=20.0 CU Final    Cardiolipin IgA 02/02/2023 22.7 (H)  <=20.0 CU Final    Anti-Mullerian Hormone 02/02/2023 1.944  0.176 - 11.705 ng/mL Final    Comment: INTERPRETIVE INFORMATION: Anti-Mullerian Hormone    FEMALE:  6 months - 14 years: 0.256 - 6.345 ng/mL  15-17 years:         0.861 - 10.451 ng/mL  18-29 years:         0.401 - 16.015 ng/mL  30-39 years:         0.176 - 11.705 ng/mL  40-45 years:         6.282 ng/mL or less  46-50 years:         0.064 ng/mL or less  Post-menopausal:     0.003 ng/mL or less    FEMALE:  6-11 months:         56.677 - 495.299 ng/mL  1-6 years:           33.442 - 342.450 ng/mL  7-9 years:           20.245 - 189.781 ng/mL  10-12 years:         2.903 - 178.243 ng/mL  13 years and older:  2.079 - 30.656 ng/mL    This test was developed and its performance characteristics   determined by [facility]. It has not been cleared or   approved by the [place] Food and Drug Administration. This test was   performed in a CLIA certified laboratory and is intended for   clinical purposes.  Performed By: [facility]  [address]  [place], [place] [address]  Laboratory Director: Evans Lance, MD, PhD  CLIA                            Number: [id number]    Chlamydia trachomatis PCR 02/02/2023 Negative  Negative Final    Neisseria gonorrhoeae PCR 02/02/2023 Negative  Negative Final    Specimen Type 02/02/2023 Urine   Final    Urine Color 02/02/2023 Light-Yellow    Final    Specific Gravity 02/02/2023 1.011  1.005 - 1.030 Final    pH,Urine 02/02/2023 6.0  5.0 - 8.0 Final    Blood 02/02/2023 1+ (A)  Negative Final    Bilirubin 02/02/2023 Negative  Negative Final    Ketones 02/02/2023 Negative  Negative Final    Glucose 02/02/2023 Negative  Negative Final    Protein 02/02/2023 Negative  Negative Final    Leukocyte Esterase 02/02/2023 Negative  Negative Final    Nitrite 02/02/2023 Negative  Negative Final    RBC per uL 02/02/2023 2  0 - 11 cells/uL Final    WBC per uL 02/02/2023 3  0 - 22 cells/uL Final    RBC per HPF 02/02/2023 0  0 - 2 cells/HPF Final    WBC per HPF 02/02/2023 1  0 - 4 cells/HPF Final    Squamous Epi Cells 02/02/2023 2  0 - 17 cells/uL Final    White Blood Cell Count 02/02/2023 5.25  4.16 - 9.95 x10E3/uL Final    Red Blood Cell Count 02/02/2023 4.40  3.96 - 5.09 x10E6/uL Final    Hemoglobin 02/02/2023 12.9  11.6 - 15.2 g/dL Final    Hematocrit 16/03/9603 38.3  34.9 - 45.2 % Final    Mean Corpuscular Volume 02/02/2023 87.0  79.3 - 98.6 fL Final    Mean Corpuscular Hemoglobin 02/02/2023 29.3  26.4 - 33.4 pg Final    MCH Concentration 02/02/2023 33.7  31.5 - 35.5 g/dL Final    Red Cell Distribution Width-SD 02/02/2023 40.5  36.9 - 48.3 fL Final    Red Cell Distribution Width-CV 02/02/2023 12.7  11.1 - 15.5 % Final    Platelet Count, Auto 02/02/2023 249  143 - 398 x10E3/uL Final    Mean Platelet Volume 02/02/2023 10.7  9.3 - 13.0 fL Final    Nucleated RBC%, automated 02/02/2023 0.0  No Ref. Range % Final    Percent Reference Range Not Reported per accrediting agency    Absolute Nucleated RBC Count 02/02/2023 0.00  0.00 - 0.00 x10E3/uL Final    Neutrophil Abs (Prelim) 02/02/2023 2.39  See Absolute Neut Ct. x10E3/uL Final    This is a preliminary result.  If automated differential see Absolute Neut  Count or if manual differential see Absolute Neut Ct, Manual for final result.    Neutrophil Percent, Auto 02/02/2023 45.5  No Ref. Range % Final    Percent reference range not reported per accrediting agency.      Lymphocyte Percent, Auto 02/02/2023 42.3  No Ref. Range % Final    Percent reference range not reported per accrediting agency.      Monocyte Percent, Auto 02/02/2023 8.4  No Ref. Range % Final    Percent reference range not reported per accrediting agency.      Eosinophil Percent, Auto 02/02/2023 2.5  No Ref. Range % Final    Percent reference range not reported per accrediting agency.      Basophil Percent, Auto 02/02/2023 1.1  No Ref. Range % Final    Percent reference range not reported per accrediting agency.      Immature Granulocytes% 02/02/2023 0.2  No Reference Range % Final    Percent reference range not reported per accrediting agency.      Absolute Neut Count 02/02/2023 2.39  1.80 - 6.90 x10E3/uL Final    Absolute Lymphocyte Count 02/02/2023 2.22  1.30 - 3.40 x10E3/uL Final    Absolute Mono Count 02/02/2023 0.44  0.20 - 0.80 x10E3/uL Final    Absolute Eos Count 02/02/2023 0.13  0.00 - 0.50 x10E3/uL Final    Absolute Baso Count 02/02/2023 0.06  0.00 - 0.10 x10E3/uL Final    Absolute Immature Gran Count 02/02/2023 0.01  0.00 - 0.04 x10E3/uL Final        10-year ASCVD risk  cannot be calculated because at least one required variable is not available in [facility]. as of 4:03 PM on 04/04/2023  10-year ASCVD risk with optimal risk factors cannot be calculated.  2018 ACC/AHA  guidelines recommends that patient is not in statin benefit group. Encourage adherence to heart-healthy lifestyle.      Imaging Studies:   Last mammogram:  Results for orders placed during the hospital encounter of 10/27/21    Mammo tomosynthesis, screening, bilat breast 10/27/2021 10/28/2021    Impression  There is no mammographic evidence of malignancy.    Routine screening mammogram at age 19 is recommended.    BI-RADS Category 1:  Negative        Report Electronically Signed by: Angelena Sole 10/28/2021    Technologist: Mosetta Anis      Author:  Waverly Ferrari. Dub Mikes, MD 04/04/2023 4:03 PM  Assistant Clinical Professor  [employer] [facility] [place]  Department of [facility]    This note was in part dictated. Please excuse any typos as a result.

## 2023-04-04 NOTE — Patient Instructions
It was a pleasure to see you for your Annual Preventative Exam today. Remember that what is part of the covered preventative exam is actually limited. Chronic and new health conditions are often not covered and if we discussed many of these other things it may result in a co-pay, contingent on your insurance.    Things to do that we discussed during today's visit:  Do COVID boosters.  Do Flu shot.   You can call [phone] to schedule to schedule with Dr Lucia Estelle for GI. They are located at [address], [address] in [place]. Their is free parking for 2 hours in the city parking garages around the corner on [address].   Use hydrocortisone rectal cream for inflamed hemorrhoids up to 2 weeks per month.    Hemorrhoids:  hemorrhoids, also known as piles, are swollen veins in the lower rectum and anus. They're similar to varicose veins, but they're located in the rectum. The causes and risk factors include constipation, straining during bowel movements, sitting for extended periods, obesity, and pregnancy.    It is best to try a multi-modal approach to treating:  It is important for the stools to be soft and easy to pass. Any constipation or straining makes hemorrhoids worse. Anything that increases pressure in the abdomen such as weight lifting and running can also make hemorrhoids larger and symptoms worse.  Do NOT spend more than 3-5 minutes on the toilet. If it takes longer than this get up and come back later when you are ready.  Please use fiber supplementation (2 fiber capsules twice a day OR 1-2 tablespoons of powder once a day, Metamucil or any other brand with psyllium husk; avoid methycellulose) with at least 8 ounces of water.  Stool softener once or twice a day (Dulcolax or similar) if needed.  You can also use sitz baths 2-3 times per day for discomfort and pain. These are available over the counter at the pharmacy.  You can use Preparation H (active ingredient phenylepherine - see image below) per the instructions on the packaging.  You can also use hydrocortisone cream 2-3 times per day for up to two weeks, I have sent a prescription for this.   Try and Squatty potty to align the GI track for easy bowel movements.     - Your time is important to me. I do my best to see you at your appointment time, although oftentimes delays are out of my control. In order to maximize your time with me and as a courtesy to other patients and staff please arrive at least 10-15 minutes EARLY to all your appointments. This allows time for the check-in process: vitals, verifying medications and having the medical assistant ready everything for your visit.    - Please be patient with test results. Labs will be released to you automatically as soon as they are done. This means that you will see them before I do. I will typically review them and place comments on the labs themselves or send a message with a summary within a few days of ALL of the labs being completed. Imaging and other types of results are not automatically released and must be released manually. If you haven't heard from me a week after having the labs done don't hesitate to contact the office for updates.     -If you haven't already, don't forget to sign up for the [facility] patient portal so that you can see your lab results and send messages to  our office.    -Due to the increasingly complex nature of the MyChart messaging system, our physicians have adopted the [facility] policy. When a physician responds to an inquiry sent on MyChart via message or telephone call, the patient's insurance may be billed for the communication encounter. These charges may or may not be covered by your insurance provider. If your physician determines that your concern is too complex for this format of communication, they may recommend scheduling a visit with [place]. If your message results in a scheduled visit or falls within seven (7) days of an upcoming/completed visit, you will not be billed for the communication.

## 2023-04-04 NOTE — Assessment & Plan Note
Diet controlled. Not in statin benefit group  - low cholesterol, moderate carb, heart healthy diet, and exercise at least 150 min/week

## 2023-04-06 ENCOUNTER — Ambulatory Visit: Payer: Self-pay

## 2023-04-06 ENCOUNTER — Telehealth: Payer: Self-pay

## 2023-04-08 ENCOUNTER — Telehealth: Payer: BLUE CROSS/BLUE SHIELD

## 2023-04-08 NOTE — Telephone Encounter
Pt informed of GC results per Dr. Sharman Cheek. Will coordinate appointment with Liane Comber.Marland Kitchen

## 2023-04-08 NOTE — Telephone Encounter
Pt scheduled  

## 2023-04-08 NOTE — Telephone Encounter
Call Back Request    Reason for call back: Per Dr. Sharman Cheek, patient needs to schedule an appt with Dr. Carmin Muskrat to discuss genetic counseling. Patient is seeking VV. Please assist.     Any Symptoms:  []  Yes  [x]  No      If yes, what symptoms are you experiencing:    Duration of symptoms (how long):    Have you taken medication for symptoms (OTC or Rx):      If call was taken outside of clinic hours:    [] Patient or caller has been notified that this message was sent outside of normal clinic hours.     [] Patient or caller has been warm transferred to the physician's answering service. If applicable, patient or caller informed to please call us back if symptoms progress.  Patient or caller has been notified of the turnaround time of 1-2 business day(s).     Appointment Accommodation Request      Appointment Type: RTN    Reason for sooner request: Dr. Sharman Cheek advised patient to contact office to schedule follow up visit to discuss results but next available VV and in person visit is 06/2023. Patient seeking VV.     Date/Time Requested (If any): Within the next couple of weeks.     Last seen by MD: 02/04/23 -Dr. Mindi Junker- Nathanial Rancher.    Any Symptoms:  []  Yes  [x]  No      If yes, what symptoms are you experiencing:   Duration of symptoms (how long):     Patient or caller was offered an appointment but declined.    Patient or caller was advised to seek emergency services if conditions are urgent or emergent.    Patient or caller has been notified of the turnaround time of 1-2 business (days).

## 2023-04-10 ENCOUNTER — Ambulatory Visit: Payer: Self-pay

## 2023-04-11 NOTE — Telephone Encounter
LV 9/23    Assessment/Plan      # Acne vulgaris  - clindamycin 1% external solution; APPLY SPARINGLY INTO AFFECTED AREA(S) on back TWICE DAILY.  Dispense: 60 mL; Refill: 4        # Cherry angioma  - trunk and extremities  - benign  - reassured     # Benign nevus  - multiple on trunk and extremities  - Discussed signs of skin cancer,  - Counseled on UV protection     # Sebaceous cyst vs xanthoma  - right palm  - will monitor     # Acral nevus  - right heel  - photo taken  - will monitor

## 2023-04-11 NOTE — Telephone Encounter
Please assist with an in person or video visit.  Thanks

## 2023-04-12 ENCOUNTER — Telehealth: Payer: Self-pay | Attending: MS"

## 2023-04-12 ENCOUNTER — Ambulatory Visit: Payer: Self-pay

## 2023-04-12 DIAGNOSIS — Z148 Genetic carrier of other disease: Secondary | ICD-10-CM

## 2023-04-12 DIAGNOSIS — Z315 Encounter for genetic counseling: Secondary | ICD-10-CM

## 2023-04-12 NOTE — Progress Notes
REPRODUCTIVE GENETICS CONSULTATION NOTE  Department of Obstetrics and Gynecology   [address]~[place], [place] [address]    PATIENT:[name] [name]   DOB:09-01-1985  DZH:[id number]   DATE OF SERVICE:04/12/2023    REFERRING CLINICIAN: Al-Safi, Stefanie Libel., MD  INDICATION FOR CONSULTATION: Both partners possible CAH carriers  Dear Dr. Sharman Cheek,     It was a pleasure to meet [name] [name], and her partner, [name] [name]  today. We obtained a detailed three-generation family pedigree (scanned in medical records) and additional history was available from review of medical records.    HISTORY  1. [name] [name] is  a 37 y.o., G0 and of Iranian/[place]ian (paternal) and [place] (maternal) descent. Her partner is 36 -y.o and of Iranian/Persian descent.     2. The following history was reported:  [name] [name]'s parents are generally well. Her mom, now 54, was reported to be diagnosed with Lupus in her 30s. More recently, [name] [name] reported her mom's testing started to suggest Sjogren's. [name] [name]'s dad, now 77, has a personal history of scleroderma (diagnosed ~50s).  [name] [name]'s parent's are generally well. His mom, now 69, has Type 2 Diabetes and his dad has eczema.  [name] [name] has a maternal second-cousin with ''severe autism''. He reported that this might have been related to pregnancy itself, and that he believes there was ''water in the brain''.    3.  The family history is unremarkable for malformations, known genetic disorders, fetal wastage, intellectual disabilities, and consanguinity.    4. Per previous consult notes/Questionnaire, no infectious disease exposures, fever, rash, signs of miscarriage, or other complications have occurred.  She denied exposure to alcohol, tobacco or other drugs during this pregnancy.      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  Specifically,    RISK ASSESSMENT   1. We reviewed the inheritance pattern of Congenital Adrenal Hyperplasia (CAH) and the ''possible carrier'' result. 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 We discussed that this pathogenic variant and the duplication are often found in cis (on the same chromosome) and that this configuration is associated with normal gene function (i.e. does not pose a carrier risk to a pregnancy).      Phasing of the variant and the duplication is not possible with this level of testing. Instead, family studies (specifically, testing of their parents) may help to resolve their  true carrier status. They plan to discuss with [name] [name] parents to see if they will be willing to pursue testing, then follow-up with [place].      2. At the couple's request, we also talked about the clinical characteristics of CAH and the spectrum of presentations:    Congenital Adrenal Hyperplasia (CAH) is an autosomal recessive disorder most often caused by 21-hydroxylase deficiency. CAH affects the adrenal glands which produce a variety of hormones that regulate many essential functions in the body. In people with CAH, the adrenal glands produce excess androgens (female sex hormones). There are three types of CAH 21-hydroxylase deficiency: 1) Classic, salt-wasting; 2) Classic, simple virilizing; 3) non-classic type. The salt-wasting type is the most severe, the simple virilizing type is less severe, and the non-classic type is the least severe form.     Approximately 75% of individuals with classic 21-hydroxylase deficiency have the salt-wasting type. Hormone production is extremely low in this form of the disorder. Affected individuals lose large amounts of sodium in their urine, which can be life-threatening in early infancy. Babies with the salt-wasting type can experience poor feeding, weight loss, dehydration, and vomiting. Individuals with the simple virilizing form do not experience salt loss.     Males and females with either classic form of 21-hydroxylase deficiency tend to have an early growth spurt, but their final adult height is usually shorter than others in their family. Additionally, affected individuals may have a reduced ability to have biological children (decreased fertility). Females may also develop excessive body hair growth (hirsutism), female pattern baldness, and irregular menstruation. They may also have genitalia that do not appear clearly female or female (''ambiguous genitalia'').      3.   A) We also briefly discussed the options of pre-implantation genetic testing (PGT) and prenatal diagnosis. This is useful if both partners are found to be true carriers of CAH and they plan to pursue IVF conception.  There are three types of preimplantation genetic testing (PGT, formerly known as PGD or preimplantation genetic diagnosis). All require in vitro fertilization (IVF); biopsy of an embryo, or less commonly polar body/bodies (I and II), for genetic testing; and then transfer of selected fresh or frozen-thawed embryos into the uterus based on the results of genetic testing.   Preimplantation genetic testing for aneuploidy (PGT-A): The goal of PGT-A is to identify embryos with de novo aneuploidy, including subchromosomal deletions and additions (duplications), in embryo(s) of parents presumed to be chromosomally normal. Theoretically, avoiding transfer of these embryos will reduce the risk of miscarriage and complications related to pregnancy failure and improve the probability of conceiving a viable pregnancy.  Preimplantation genetic testing for monogenic (single-gene) disorders (PGT-M)*: The goal of PGT-M is to establish a pregnancy that is unaffected by specific genetic characteristics, such as a known heritable pathogenic variant carried by one or both biological parents. Since cells have already been biopsied and analyzed, PGT-A is also commonly performed.  Preimplantation genetic testing for structural rearrangements (PGT-SR): The goal of PGT-SR is to establish a pregnancy that is unaffected by a structural chromosomal abnormality (translocation) in a couple with a balanced translocation or deletion/duplication. Contemporary technology may actually distinguish normal non-carrier embryos from balanced carriers.  *If both partners are found to be true CAH carriers, PGT-M can help to determine affected status of embryos.    B) We also briefly discussed diagnostic testing during pregnancy to know affected status, if both partners were true carriers:  CVS and standard amniocentesis allow sampling of fetal  cells that can be tested to definitively diagnose or rule out aneuploidies or copy number variants such as microdeletions or microduplications. However, neither a normal karyotype nor microarray can detect or rule out all birth defects. Moreover, the microarray may reveal variants of unknown significance, that is, of uncertain prognostic value for postnatal health. The rate of miscarriage is < 1/500 above the background risk following CVS and < 06/998 following amniocentesis.  Risk of limb reduction defects with CVS is 06/2898, according to the CDC.  There is a 1-2% chance that a mosaic result may occur with CVS in which case additional diagnostic procedures may be indicated to further define the results.  Neural tube defects are not detected by CVS and maternal serum AFP screening is recommended to be drawn by the OB provider at 15-20 weeks.  Ultrasound combined with amniocentesis detects approximately 98% of open neural tube defects.       By the end of our conversation, Mazi and Nelta Numbers wish to try and resolve their carrier status to know whether they are true carriers or not. They plan to ask [name] [name]'s parents if they would be willing to pursue testing then follow-up with [place] to confirm. If they agree, we can coordinate [employer] saliva kits be shipped for sample collection, then follow-up with an interpretation once results are received. The couple intends to pursue natural conception and prefer to start trying to conceive within the next month. Knowing their true carrier status will help them in their family planning.      SUMMARY OF PLAN  1. Couple to confirm with their parents that they are willing to participate in family studies. They will confirm with [place] the decision, and we will coordinate saliva kits be sent.    Thank you for referring this patient.  Please do not hesitate to contact us at [phone] for any questions or concerns.  60 minutes were spent personally by me today on this encounter which include today's pre-visit review of the chart, obtaining appropriate history, performing an evaluation, documentation and discussion of management with details supported within the note for today's visit. The time documented was exclusive of any time spent on the separately billed procedure.      Sincerely,     Coy Saunas, M.S., C.G.C.  Genetic Counselor  Department of Obstetrics and Gynecology

## 2023-04-14 ENCOUNTER — Telehealth: Payer: Self-pay | Attending: Student in an Organized Health Care Education/Training Program

## 2023-04-14 DIAGNOSIS — L309 Dermatitis, unspecified: Secondary | ICD-10-CM

## 2023-04-14 DIAGNOSIS — L2389 Allergic contact dermatitis due to other agents: Secondary | ICD-10-CM

## 2023-04-14 NOTE — Progress Notes
Dermatology Attending Note    PATIENT: [name] [name]  MRN: [id number]  DOB: Jul 06, 1985  DATE OF SERVICE: 04/14/2023    CHIEF COMPLAINT:   No chief complaint on file.      Subjective:     Kaleiah Hardey is a 37 y.o. female presenting on a video visit for skin concerns.     Notes a new rash on forehead. First developed about a week ago with cluster of itchy bumps.  Has been resolving after applying hydrocortisone.  Reports similar rash on arms in the past.     Hx of positive SSA, B2-glycoprotein and anti-cardiolipin   PMH: No personal hx of skin cancer    No outpatient medications have been marked as taking for the 04/14/23 encounter (Appointment) with Gevena Cotton, MD, PhD.     No Known Allergies    Objective:     Limited exam on video:  Forehead with an erythematous plaque    Assessment/Plan       1. Allergic contact dermatitis due to other agents  - Ddx: HSV given the presence of small vesicles  - resolving on exam today  - discussed in depth that a viral swab needs to be sent in case the lesions recur to rule out HSV  - explained that HSV on face needs to be treated and monitored due to the risk of facial nerve involvement.    Will refer for patch testing  - Referral to Dermatology, Patch Testing    R/o HSV    # Dermatitis  - as above    -------------------------------------------------------------------------------------------------------------------------  The above diagnosis and treatment options were reviewed with the patient.  We recommended sunscreen and sun-protective clothing on a daily basis.  The patient was informed of the major side effects associated with the above medication(s) and recommended to read the package inserts. Education was provided on the correct use of the medications and over the counter products, and the need to use these on a regular basis in order to achieve the best results.         RTC PRN , or as needed sooner for any new, changing, evolving, or concerning lesions or symptoms.        Gevena Cotton, MD, PhD 04/14/2023 3:11 PM

## 2023-04-26 ENCOUNTER — Telehealth: Payer: Self-pay

## 2023-04-26 NOTE — Consults
TELE-HEALTH VISIT  REI FOLLOW UP        [name] [name] is a 37 y.o. G0 following up to review plans for conception   They are leaning to start trying to conceive soon  Ideally would like to have 2-3 children     Currently using withdrawal method  Regular periods        Seen by rheumatologist   Elevated anticardiolipin antibodies and B2 glycoprotein antibodies in the past in addition to positive SSA antibodies  Has Raynaud's   Seen by Dr. 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   History of oral contraceptive use: Yes   currently    History of sexually transmitted infections: HSV  History of abnormal pap smear: no   Last PAP smear: 2020. Negative         Prior Treatments for Infertility:   No      Past Obstetrical History:   G:0        Past Medical History:   Neg      Past Surgical History:   Wisdom teeth extraction      Meds:   None      Allergies:   NKDA        Family History:   Great MGM and MGM: breast cancer  MGM tested negative   Mother: Lupus  No FamHx of mental retardation or slow learners     Social History:   Cigarette use: denied  Alcohol intake: 0-4 drinks per week  Elicit drug use: None  Occupation: Warden/ranger   Ethnic Background: White/Middle-eastern           Transvaginal Gyn Ultrasound done 02/2022:     Retroverted uterus measures 6.8 x 4.1 x 4.7 cm.   Endometrial thicknesss measures 4.0 mm.   The right ovary measures 3.2 x 1.8 x 2.1 cm, with 12 antral follicles.   The left ovary measures 1.7 x 1.0 x 1.7 cm, with 8 antral follicles.         Component      Latest Ref Rng 02/02/2023  9:39 AM 02/02/2023  9:40 AM 03/21/2023  3:05 PM   Beta-2-Glycoprotein IgG      <=20 SGU <10      Beta-2-Glycoprotein IgM      <=20 SMU 23 (H)      Beta-2-Glycoprotein IgA      <=20 SAU <10      Cardiolipin IgG      <=20.0 CU <20.0      Cardiolipin IgM      <=20.0 CU <20.0      Cardiolipin IgA      <=20.0 CU 22.7 (H)      Chlamydia trachomatis PCR Negative   Negative     Neisseria gonorrhoeae PCR      Negative   Negative     Specimen Type  Urine     State Law   Notify patient    Blood Type (ABO/Rh)   B Positive    Antibody Screen   Negative    HIV-1/2 Ag/Ab Screen 4th Generation      Nonreactive  Nonreactive      HCV Antibody Screen      Nonreactive  Nonreactive      HBS Antigen      Nonreactive  Nonreactive      RPR with TP-PA Confirmation  Nonreactive  Nonreactive      TSH      0.3 - 4.7 mcIU/mL 1.1      Hemoglobin A1C      <5.7 % 5.3      DRVVT Interpretation      Negative  Negative      Anti-Mullerian Hormone AssessR      0.176 - 11.705 ng/mL 1.944      Measles Ab Immune Status      Negative    Positive    Rubella Ab Immune Status      Negative    Positive    VZV Ab Immune Status      Negative    Positive       Legend:  (H) High       Component      Latest Ref Rng 04/01/2023  2:36 PM   Concentration (Manual)      15 M/ml 9.7 !    Total Motile PR + NP (%) (Manual)      40 % 53    Progressive PR (Manual)      32 % 39    Non Progressive NP (Manual)      % 14    Immotile IM (Manual)      % 47    Normal Forms (Auto%) (Manual)      4 % 9    Motile Sperm Conc. (Manual)      M/ml 5.1    Prog. Motile Sperm Conc. (Manual)      M/ml 3.8    Functional Sperm Conc. (Manual)      M/ml 0.8    Velocity (Manual)      mic/sec 33    Sperm Motility Index (Manual) 27    Sperm # (Manual)      39 M/ejac 19.4 !    MOTILE SPERM(M/EJAC) 10.3    Prog. Motile Sperm  (Manual)      M/ejac 7.6    Functional Sperm (Manual)      M/ejac 1.6    Morph Normal Sperm (Manual)      M/ejac 1.7    Volume (Manual)      M/ml 2.00    pH (Manual) 8.0    WBC Conc. (Manual)      M/ml <1    Debris/Round Cells (Manual) none/few       Legend:  ! Abnormal  ASSESSMENT / PLAN     1. Plans for future pregnancy  Discussed preconception assessment   Will check immunity to rubella, measles and varicella  Check blood type and antibody screen  Partner to do semen analysis     If no pregnancy after 6 months of trying, will re-evaluate and assess with HSG     2. Discussed the negative effects of reproductive aging on fertility  Decreased quantity and quality of oocytes associated with decreased chances of pregnancy, increased chances of miscarriages and chromosomal abnormalities in offspring like Down's syndrome.  Consider fertility preservation options if planning for more than one child in the future     Discussed preimplantation genetic testing for aneuploidy and prenatal testing with invasive and non-invasive options     3. Positive antiphospholipid antibodies and SSA antibodies  Seen by Dr. Cyndie Chime / MFM  Plan to start *** Aspirin during pregnancy and postpartum       4. 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 The time documented was exclusive of any time spent on the separately billed procedure.      Cheryl Flash, MD  Reproductive Endocrinology and Infertility

## 2023-04-28 DIAGNOSIS — Z3169 Encounter for other general counseling and advice on procreation: Secondary | ICD-10-CM

## 2023-04-28 NOTE — Progress Notes
TELE-HEALTH VISIT  REI FOLLOW UP        [name] [name] is a 37 y.o. G0 following up to review plans for conception   They are leaning to start trying to conceive soon  Ideally would like to have 2-3 children    Regular periods      Seen by rheumatologist   Elevated anticardiolipin antibodies and B2 glycoprotein antibodies in the past in addition to positive SSA antibodies  Has Raynaud's   Seen by Dr. 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   History of oral contraceptive use: Yes   currently    History of sexually transmitted infections: HSV  History of abnormal pap smear: no   Last PAP smear: 2020. Negative         Prior Treatments for Infertility:   No      Past Obstetrical History:   G:0        Past Medical History:   Neg      Past Surgical History:   Wisdom teeth extraction      Meds:   None      Allergies:   NKDA        Family History:   Great MGM and MGM: breast cancer  MGM tested negative   Mother: Lupus  No FamHx of mental retardation or slow learners     Social History:   Cigarette use: denied  Alcohol intake: 0-4 drinks per week  Elicit drug use: None  Occupation: Warden/ranger   Ethnic Background: White/Middle-eastern           Transvaginal Gyn Ultrasound done 02/2022:     Retroverted uterus measures 6.8 x 4.1 x 4.7 cm.   Endometrial thicknesss measures 4.0 mm.   The right ovary measures 3.2 x 1.8 x 2.1 cm, with 12 antral follicles.   The left ovary measures 1.7 x 1.0 x 1.7 cm, with 8 antral follicles.         Component      Latest Ref Rng 02/02/2023  9:39 AM 02/02/2023  9:40 AM 03/21/2023  3:05 PM   Beta-2-Glycoprotein IgG      <=20 SGU <10        Beta-2-Glycoprotein IgM      <=20 SMU 23 (H)        Beta-2-Glycoprotein IgA      <=20 SAU <10        Cardiolipin IgG      <=20.0 CU <20.0        Cardiolipin IgM      <=20.0 CU <20.0        Cardiolipin IgA      <=20.0 CU 22.7 (H)        Chlamydia trachomatis PCR      Negative    Negative Neisseria gonorrhoeae PCR      Negative    Negative      Specimen Type   Urine      State Law     Notify patient    Blood Type (ABO/Rh)     B Positive    Antibody Screen     Negative    HIV-1/2 Ag/Ab Screen 4th Generation      Nonreactive  Nonreactive        HCV Antibody Screen      Nonreactive  Nonreactive  HBS Antigen      Nonreactive  Nonreactive        RPR with TP-PA Confirmation      Nonreactive  Nonreactive        TSH      0.3 - 4.7 mcIU/mL 1.1        Hemoglobin A1C      <5.7 % 5.3        DRVVT Interpretation      Negative  Negative        Anti-Mullerian Hormone AssessR      0.176 - 11.705 ng/mL 1.944        Measles Ab Immune Status      Negative      Positive    Rubella Ab Immune Status      Negative      Positive    VZV Ab Immune Status      Negative      Positive       Legend:  (H) High        Component      Latest Ref Rng 04/01/2023  2:36 PM   Concentration (Manual)      15 M/ml 9.7 !    Total Motile PR + NP (%) (Manual)      40 % 53    Progressive PR (Manual)      32 % 39    Non Progressive NP (Manual)      % 14    Immotile IM (Manual)      % 47    Normal Forms (Auto%) (Manual)      4 % 9    Motile Sperm Conc. (Manual)      M/ml 5.1    Prog. Motile Sperm Conc. (Manual)      M/ml 3.8    Functional Sperm Conc. (Manual)      M/ml 0.8    Velocity (Manual)      mic/sec 33    Sperm Motility Index (Manual) 27    Sperm # (Manual)      39 M/ejac 19.4 !    MOTILE SPERM(M/EJAC) 10.3    Prog. Motile Sperm  (Manual)      M/ejac 7.6    Functional Sperm (Manual)      M/ejac 1.6    Morph Normal Sperm (Manual)      M/ejac 1.7    Volume (Manual)      M/ml 2.00    pH (Manual) 8.0    WBC Conc. (Manual)      M/ml <1    Debris/Round Cells (Manual) none/few       Legend:  ! Abnormal  ASSESSMENT / PLAN     1. Partner with oligospermia  Discussed findings and impact on chances  Recommended repeating semen analysis and consultation with Urology  Recommended fertility treatments if continued with abnormal semen analysis  IUI can be attempted with mild abnormalities  IVF/ICSI for severe abnormalities    If no pregnancy after 6 months of trying, will re-evaluate and assess with HSG     2. Discussed the negative effects of reproductive aging on fertility  Decreased quantity and quality of oocytes associated with decreased chances of pregnancy, increased chances of miscarriages and chromosomal abnormalities in offspring like Down's syndrome.  Consider fertility preservation options if planning for more than one child in the future  Discussed preimplantation genetic testing for aneuploidy and prenatal testing with invasive and non-invasive options     3. Positive antiphospholipid antibodies and SSA  antibodies  Seen by Dr. Cyndie Chime / MFM  Plan to start Aspirin during pregnancy   Given option to start Lovenox as well during pregnancy and postpartum      4. 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 The time documented was exclusive of any time spent on the separately billed procedure.      Cheryl Flash, MD  Reproductive Endocrinology and Infertility

## 2023-05-24 ENCOUNTER — Telehealth: Payer: Self-pay

## 2023-05-24 ENCOUNTER — Ambulatory Visit: Payer: Self-pay

## 2023-05-24 NOTE — Telephone Encounter
Appointment Accommodation Request      Appointment Type: NEW OB     Reason for sooner request: patient seeking any female provider in [place] or [facility] preferred) - LMP 04/22/23. [place] out of [place] and asks office please reach out via [employer], thank you    Date/Time Requested (If any): 12/13 - 12/27    Last seen by MD: NOB    Any Symptoms:  []  Yes  [x]  No      If yes, what symptoms are you experiencing:   Duration of symptoms (how long):     Patient or caller was offered an appointment but declined.    Patient or caller was advised to seek emergency services if conditions are urgent or emergent.    Patient or caller has been notified of the turnaround time of 1-2 business (days).

## 2023-05-30 ENCOUNTER — Ambulatory Visit: Payer: Self-pay

## 2023-05-31 ENCOUNTER — Telehealth: Payer: Self-pay

## 2023-05-31 DIAGNOSIS — Z3A01 Less than 8 weeks gestation of pregnancy: Secondary | ICD-10-CM

## 2023-05-31 DIAGNOSIS — J069 Acute upper respiratory infection, unspecified: Secondary | ICD-10-CM

## 2023-05-31 NOTE — Progress Notes
[facility] INTERNAL MEDICINE & PEDIATRICS - Office Note    Video (telemedicine) visit     Provider Location: [facility] ([address], [address], [place], [place] [address])  Patient Location: home    PATIENT: [name] [name]  MRN: [id number]  DOB: 1986/01/07  DATE OF SERVICE: 05/31/2023    Subjective:   CC:  Cough (Dry cough. Notice Red mucous secretion. About X 10 Days start with Sore throat, later running nose and now cough.) and Follow-up (Pt is [redacted] weeks pregnant.)     HISTORY OF PRESENT ILLNESS  [name] [name] is a 37 y.o. female presents for:     Has a cough, also pregnant (4w).  Symptoms started 10d ago, initially sore throat than stuffy nose/runny nose, that cleared and became sneezing and cough.  Cough has gotten better, dry now and a lot of throat clearing.  This AM feels better, jetlag (just out of country).  A little red in mucus this AM (first with some dark brown then with a red streak).  No fevers.  No medications other than asa as recommended by MFM for elevated ab.  First gen OB appt in 3 weeks, had questions.  (click to expand/collapse)      Review of Systems  All review of systems negative except as noted in the history section.    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 No longer present    Seasonal allergies     Sexually transmitted infection     Herpes       Medications/Supplements  No outpatient medications have been marked as taking for the 05/31/23 encounter (Telemedicine) with Thayer Dallas., MD.      (click to expand/collapse)    Objective:   Physical Exam  Constitutional - alert, no distress noted on the electronic communication with the patient  Pulm - did not appear to have WOB or tachypnea on the video visit     Assessment/Plan:   [name] [name] is a a 37 y.o. female presenting via video visit for:       ICD-10-CM    1. Viral URI with cough  J06.9       2. Less than [redacted] weeks gestation of pregnancy  Z3A.01         Sounds viral and resolving.  A little bit of blood ok.  Discussed OTC meds safe in preg.    Discussed early preg counseling including starting PNV, avoiding substances, food and med safety.  Provided [employer].    The above plan of care, diagnosis, orders, and follow-up were discussed with the patient.  Questions related to this recommended plan of care were answered.    30 minutes were spent personally by me today on this encounter which include today's pre-visit review of the chart, obtaining appropriate history, performing an evaluation, documentation and discussion of management with details supported within the note for today's visit. The time documented was exclusive of any time spent on the separately billed procedure.          Return if symptoms worsen or fail to improve.     Thayer Dallas, MD  05/31/2023 at 3:38 PM      Patient Consent to  Telehealth Questionnaire   - I agree  to be treated via a video visit and acknowledge that I may be liable for any relevant copays or coinsurance depending on my insurance plan.  - I understand that this video visit is offered for my convenience and I am able to cancel and reschedule for an in-person appointment if I desire.  - I also acknowledge that sensitive medical information may be discussed during this video visit appointment and that it is my responsibility to locate myself in a location that ensures privacy to my own level of comfort.  - I also acknowledge that I should not be participating in a video visit in a way that could cause danger to myself or to those around me (such as driving or walking).  If my provider is concerned about my safety, I understand that they have the right to terminate the visit.

## 2023-06-01 ENCOUNTER — Ambulatory Visit: Payer: Self-pay

## 2023-06-02 ENCOUNTER — Ambulatory Visit: Payer: BLUE CROSS/BLUE SHIELD

## 2023-06-02 DIAGNOSIS — S60551A Superficial foreign body of right hand, initial encounter: Secondary | ICD-10-CM

## 2023-06-03 ENCOUNTER — Ambulatory Visit: Payer: Self-pay | Attending: Student in an Organized Health Care Education/Training Program

## 2023-06-03 NOTE — Progress Notes
Immediate Care Note      Date of Service: 06/02/2023    Subjective:     HPI: [name] [name] is a 37 y.o. female here for:  Complain of splinters in her right hand little finger for about 1 week from a rope or pipe while on excursion in [place].  Patient states that she took out a few splinters from her hand but still sees few small blackheads in her hand and feels pickles from the splinters.  No pus exudates or swelling of the hand.  No fever chills nausea vomiting headache.  Patient is about [redacted] weeks pregnant.    No symptoms except for the above.    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    Past Surgical History:  She has a past surgical history that includes Wisdom tooth extraction.    Medication and Supplements:  Outpatient Medications Prior to Visit   Medication Sig    clindamycin 1% external solution APPLY SPARINGLY INTO AFFECTED AREA(S) on back TWICE DAILY. (Patient not taking: Reported on 05/31/2023)    hydrocortisone 2.5% rectal cream Place rectally three (3) times daily as needed for Hemorrhoids. (Patient not taking: Reported on 05/31/2023)    valACYclovir 500 mg tablet Take 1 tablet (500 mg total) by mouth two (2) times daily For 3 days. (Patient not taking: Reported on 05/31/2023)     No facility-administered medications prior to visit.       Allergies:  No Known Allergies    Social History:  She reports that she has never smoked. She has never used smokeless tobacco. She reports current alcohol use of about 0.6 oz of alcohol per week. She reports that she does not use drugs.    Review of Symptoms:  ROS Negative except for the above.    Objective:     Physical Exam  There were no vitals taken for this visit.    General: alert, well appearing, and in no distress, not ill-appearing, toxic-appearing or diaphoretic  Head: Atraumatic, normocephalic  Ears: hearing grossly normal  Eyes: EOM intact, none icteric  Mouth/Throat: mucous membranes moist  Neck: supple  CV: Normal HR, Normal pulse  Lung: Unlabored breathing  Skin:  Right hand:  Right lateral finger, multiple very small dark presumed foreign bodies on the medial aspect of the little finger, no pus exudates, no redness or swelling.  Neuro: alert, oriented, normal speech, no focal findings or movement disorder noted  Ext: FROM     Assessment/plan:   1. Splinter of right hand  Procedure:  Removal of splinter from right hand:  The right hand was cleansed with alcohol and the small splinters were scraped out of with a dermal curette superficially scraping the skin and taking out all the splinters.  Patient feels better.  No bleeding.  Antibiotic ointment applied.    Keep the area clean.  Can wash with mild soap and water, blot dry  Return sooner if worse, signs of infection or new symptoms  Wound check in 2-3 days  Symptoms of Wound Infection:  -Increase pain  -Increase redness  -Swelling  -Red streaks   -feels warm to touch  -Pus or cloudy fluid draining from wound  -Fever/chills  -Malaise (not feeling well)  Pt is stable.  Patient agrees with and understands plan.  RTC here, f/u with PCP or ER prn.  If develop any redness increase pain swelling streaking/rash or numbness tingling or weakness in the affected area, go to ER immediately. Pt voiced understanding.    Risks and benefits of medications/treatments discussed and acknowledged  Follow up with PCP  Strict return/ED precautions given. Instructed to return to urgent/ED if symptoms persist or worsen.  The above plan of care, diagnosis, order, and follow up were discussed with the patient, and the patient voiced understanding.  All patient's questions were answered satisfactory and agreed with this recommended plan of care. See AVS  for additional information and counseling materials provided to the patient.

## 2023-06-06 ENCOUNTER — Ambulatory Visit: Payer: Self-pay | Attending: Student in an Organized Health Care Education/Training Program

## 2023-06-16 DIAGNOSIS — N912 Amenorrhea, unspecified: Secondary | ICD-10-CM

## 2023-06-17 NOTE — H&P
NEW OB HISTORY & PHYSICAL    PATIENT:  [name] [name]  MRN:  [id number]  DOB:  1986-05-29  DATE OF SERVICE:  06/20/2023  LMP:  Patient's last menstrual period was 04/22/2023.  EDC:  01/27/2024    CC: amenorrhea/confirmation of pregnancy    HPI:  [name] [name] is a 37 y.o. G1P0 at [redacted]w[redacted]d by LMP who presents for initial prenatal appointment.     Cramping: mild     Vaginal bleeding: No     Nausea: no  Breast tenderness: yes  Fatigue: yes  Desired/undesired: desired  Congenital medical problems:  none  Edinburgh Postnatal Depression Scale (EPDS) score: 0    Pregnancy complicated by   1)Pos antiphospholipid antibodies and SSA antibodies: no clinical features of APS, seen by MFM, options to treat as APS with lovenox 40 mg daily and LDASA or monitor, already on LDASA, rec lovenox  2)AMA: [ ]  ANT 32 weeks  3)Lumbar disc herniation with sciatica  4)HSV: [ ]  acyclovir at 36 weeks  5)Raynauds: SSA Ab pos in past, [ ] retest in pregnancy    Patient Active Problem List   Diagnosis    Raynaud phenomenon    Globus sensation    Heat intolerance    Lumbar disc disease with radiculopathy    Hyperlipidemia    Herpes genitalis    Paresthesia of arm    Anti-cardiolipin antibody positive    Chronic constipation    External hemorrhoid    Supervision of high-risk pregnancy of elderly primigravida    Antiphospholipid antibody positive         Denies fevers, chills, vomiting, dysuria, hematuria, flank pain, or abnormal vaginal discharge.    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 No longer present    Seasonal allergies     Sexually transmitted infection     Herpes        PAST SURGICAL HISTORY:  Past Surgical History:   Procedure Laterality Date    WISDOM TOOTH EXTRACTION         OB/GYN HISTORY:  OB History   Gravida Para Term Preterm AB Living   1 SAB IAB Ectopic Multiple Live Births                  # Outcome Date GA Lbr Len/2nd Weight Sex Type Anes PTL Lv   1 Current                MEDICATIONS:    Current Outpatient Medications:     clindamycin 1% external solution, APPLY SPARINGLY INTO AFFECTED AREA(S) on back TWICE DAILY. (Patient not taking: Reported on 05/31/2023), Disp: 60 mL, Rfl: 4    valACYclovir 500 mg tablet, Take 1 tablet (500 mg total) by mouth two (2) times daily For 3 days. (Patient not taking: Reported on 05/31/2023), Disp: 24 tablet, Rfl: 2     ALLERGIES:  Patient has no known allergies.     FAMILY HISTORY:  Family History   Problem Relation Age of Onset    Skin cancer Paternal Grandfather     Stroke Paternal Grandfather     Stroke Paternal Grandmother     Arthritis Paternal Grandmother     Breast cancer Maternal Grandmother 24    Gout Maternal Grandfather  Scleroderma Father     Arthritis Father     Lupus Mother     Arthritis Mother     Breast cancer Maternal Great-Grandmother     Colon cancer Neg Hx     Endometrial cancer Neg Hx     Ovarian cancer Neg Hx        No known history of congenital or genetic abnormalities, developmental delays, bleeding disorders, clotting disorders, diabetes, or HTN.     SOCIAL HISTORY:  Social History     Socioeconomic History    Marital status: Single   Occupational History    Occupation: psychology fellow     Employer: [employer]   Tobacco Use    Smoking status: Never    Smokeless tobacco: Never   Substance and Sexual Activity    Alcohol use: Yes     Alcohol/week: 0.6 oz     Types: 1 Glasses of Wine (5 oz) per week     Comment: social     Drug use: No    Sexual activity: Yes     Partners: Male     Birth control/protection: Pregnant   Other Topics Concern    Do you exercise at least a day, 3 or more days a week? Yes    Do you follow a special diet? No    Vegan? No    Vegetarian? No    Pescatarian? No    Lactose Free? No    Gluten Free? No    Omnivore? No    Types of Exercise? (List in Comments) Yes     Comment: Walking/hiking, home workouts   Social History Narrative    Diet: omnivore. meat rarely. Fast food, none. Soda, none. +veggies +fruits.    Exercise: electric biking. Walking. VR workouts. home workouts. 3+ days per week.    Alcohol: 1-2 weekly on weekends, 2 drinks on average.    Occupation: psychologist at [employer] [place]    Sleep: improved, but still some difficulties, mostly regarding temperature        REVIEW OF SYSTEMS:  14 point review of system negative, unless mentioned in the HPI above. See patient intake questionnaire form for any additional ROS.    PHYSICAL EXAM:   BP 106/69  ~ Pulse 84  ~ Wt 123 lb (55.8 kg)  ~ LMP 04/22/2023  ~ BMI 21.79 kg/m?       General Appearance: alert, well appearing and in no distress  Abdomen: soft, nontender, nondistended, no visible scars  Pelvic Exam:             Vulva: normal appearing, no lesions or masses            Vagina: no lesions, physiologic discharge, no blood in vault             Cervix: nulliparous, no lesions, non-tender            Uterus: gravid, consistent with 8 weeks, non-tender            Adnexae: no palpable masses, non-tender  Extremities: warm and well-perfused, no LE edema or calf erythema/TTP  Skin: normal coloration and turgor, no rashes  Neurological: A&O x3, normal mood and affect    A certified chaperone was present for all sensitive exams.     ULTRASOUND:   POCUS Obstetrics 06/20/2023    Narrative  06/20/2023 Transvaginal Ultrasound:  A real-time transvaginal pelvic ultrasound was performed and revealed the  following findings:    Ultrasound limitations: none  Uterus normal    Fibroids: none    Right Ovary: normal    Left Ovary: normal    Number of gestations: 1  Gestational sac: present  Yolk sac: present  Embryonic pole: present  Crown-rump length:  2.05 cm  Estimated gestational age by crown-rump length:  8 week(s) 3 day(s)  Fetal cardiac activity: present    The uterus is anteverted with homogenous echotexture of the myometrium. A  single intrauterine pregnancy was noted within the endometrial cavity.  There is normal flow to both ovaries. No adnexal cysts or masses are seen.  No pelvic free fluid is seen.    Impression: A viable singleton intrauterine pregnancy is noted. Estimated  gestational age by crown-rump length is 8 week(s) 5 day(s). This is  consistent with the estimated gestational age by last menstrual period of  8 week(s) 3 day(s). Estimated date of delivery 01/27/2024.    Recommended follow-up: See plan below.          ASSESSMENT/PLAN:  [name] [name] is a 37 y.o. G1P0 at [redacted]w[redacted]d who presents to establish prenatal care.    -Nausea- Discussed options for treatment including avoiding triggers and medications such as vitamin B6, zofran, etc. Declines meds.  -Discussed breast feeding and options for further education re breast feeding, classes etc. Educated re lactation specialist in hospital  -Advanced maternal age- discussed recommendations for genetic counseling and option of CVS/Amnio/NIPT testing, [x]  LDASA   -Reviewed normal 1st trimester symptoms.   -Reviewed standard prenatal care including timing of prenatal visits and ultrasounds, expected weight gain, healthy diet/exercise. Reviewed [place] state screening with NIPT at 10 weeks and 2nd trimester screening. Offered genetics appointment for amnio if desired.   -Discussed involvement of medical students and residents on labor and delivery and rotating nature of attending call schedule on labor and delivery  -Vaccines: Recommend flu vaccine, recommend TDAP at 28 weeks  -Discussed safety of over-the-counter and prescription medications in pregnancy. Reviewed patient's current medication list. . With many medications, safety in pregnancy is not known 100%, must weigh fetal risks/maternal benefits. Avoid all unnecessary supplements/medications, avoid homeopathic medications.   -Pap up to date, last 04/2019, repeat postpartum  -Prenatal labs today  -RTC 4 weeks for ROB  -Recommend nuchal translucency ultrasound at 11 4/7 weeks to 13 4/7 weeks  -Recommend anatomy ultrasound at 19-[redacted] weeks gestation  -SAB precautions    Patient Active Problem List    Diagnosis Date Noted    Supervision of high-risk pregnancy of elderly primigravida 06/20/2023     EDC: by LMP c/w 8 week scan    Partner: Orvilla Fus    Prenatal Labs:   Blood T&S: B pos  Rubella: immune  VZV: immune  Measles: immune  HBsAg  HIV  RPR  GC/CT  MTB gold  CBC (1st tri)  Urine culture  Pap  HbA1c  1h GTT (24-28 wks)  CBC (3rd tri)  GBS (~36 wks)    Genetic Panel:   CF carrier screen  SMA carrier screen   NIPT screen  NT (11+2 to 14+2 wks)  2nd tri screen (15+0 to 20+0 wks)  Anatomy scan    Ultrasounds:  06/20/2023: [redacted]w[redacted]d by LMP, [redacted]w[redacted]d by scan.     ppBCM: discuss at viability    Vaccines: Tdap>27 weeks, influenza during season    Delivery Plan: anticipate SVD    Counseling:  06/20/2023 [redacted]w[redacted]d Discussed group practice and resident involvement in care, healthy weight gain and nutrition during pregnancy, upcoming genetic screening and 1st  trimester [place], exercise and sexual activity during pregnancy and travel and related precautions.  Provided QR code for [employer] information.  NIPT, prenatal labs ordered today. Patient is a candidate for baby ASA: Yes. HgA1c ordered: No.   SAB Precautions. Discussed APLS - recommendation for LDASA (already on) and lovenox ppx. Confirmed with MFM recommendation. Discussed HSV in pregnancy. Discussed testing SSA/SSB antibodies.          Antiphospholipid antibody positive 06/20/2023     Prepregnancy MFM consult  Options for presumptive treatment with LDASA and PPX lovenox vs monitoring.  Already on LDASA  [ ]  lovenox      Chronic constipation 04/04/2023     04/04/2023 Stable. Sometimes up to 5 days. Managing with fiber supp and occasional laxatives. She will schedule with GI as ordered by Dr. Alanson Aly.      External hemorrhoid 04/04/2023     04/04/2023 Noted on prior exam 12/2021. Occasionally inflamed. Start hydrocortisone rectal prn. Discussed behavioral modifications.      Paresthesia of arm 04/07/2022     04/07/2022 New onset in the past 6-12 months. 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 Also with positive SSA. Due for labs ordered by Rheumatology. Will draw today. F/u with Rheumatology.  Rheumatology: Dr Harrie Foreman      Herpes genitalis 01/12/2022     04/07/2022 Recent recurrence. Resolved with round of valacyclovir.  - continue valacyclovir 500mg  BID for 3 days prn recurrences    [ ]  36 week acyclovir suppression      Hyperlipidemia 04/26/2021     04/07/2022 Diet controlled. Not in statin benefit group  - low cholesterol, moderate carb, heart healthy diet, and exercise at least 150 min/week        Lumbar disc disease with radiculopathy 01/04/2020    Raynaud phenomenon 04/20/2018     04/07/2022  Stable. Extensive workup with Rheumatology positive for SSA and anticardiolipin, but no signs of connective tissue disease on history/exam.  Rheumatology: Dr Harrie Foreman  - consider CCB if more bothersome  - c/w Rheumatology    [ ]  SSA/SSB antibodies ordered 06/20/2023 in 1st trimester of pregnancy      Globus sensation 04/20/2018    Heat intolerance 04/20/2018       Orders Placed This Encounter    Bacterial Culture Urine    Chlamydia trachomatis/Neisseria gonorrhoeae PCR, Genital    POCUS Obstetrics    US ob <14 weeks 1 gestation+nuchal lucency    US ob >14 weeks 1 gestation    US ob detailed fetal anatomy 1 gestation    Maternal Draw OP-MSAFP    Maternal Draw OP-Natera (cfDNA)    MTB-Quantiferon-Gold Plus ELISA    CBC    RPR, Prenatal    Cystic Fibrosis (CFTR) Expanded Variant Panel    SSA/SSB Antibodies    Obstetric blood type and antibody screen    HBS Antigen    HCV Antibody Screen with Reflex to Quantitative PCR/Genotype    HIV-1/2 Ag/Ab 4th Generation with Reflex Confirmation    Spinal Muscular Atrophy (SMA) Copy Number Analysis       50 minutes were spent personally by me today on this encounter which include today's pre-visit review of the chart, obtaining appropriate history, performing an evaluation, documentation and discussion of management with details supported within the note for today's visit. The time documented was exclusive of  any time spent on the separately billed procedure.    No follow-ups on file. Sooner if needed.     She has our contact information for any questions or concerns.    She asked appropriate questions, these were answered to her satisfaction.    Future Appointments   Date Time Provider Department Center   07/13/2023  9:20 AM Modena Nunnery., MD [facility]   08/11/2023  9:15 AM Meda Klinefelter., MD [facility] [address] [place]   08/30/2023  1:30 PM Hipolito Bayley., MD [facility] [address] [place]   09/09/2023  9:20 AM Nelia Shi., MD [facility]   10/10/2023  8:15 AM Meda Klinefelter., MD [facility] [address] [place]   11/07/2023 10:00 AM Meda Klinefelter., MD [facility] [address] [place]/[facility]   12/05/2023  9:15 AM Meda Klinefelter., MD [facility] [address] [place]   12/05/2023  9:40 AM Modena Nunnery., MD [facility] [address] [place]/[facility]   12/19/2023 10:15 AM Meda Klinefelter., MD [facility] [address] [place]   01/02/2024  9:15 AM Meda Klinefelter., MD [facility] [address] [place]   01/16/2024 10:15 AM Meda Klinefelter., MD [facility] [address] [place]   01/23/2024 10:30 AM Meda Klinefelter., MD [facility] [address] [place]/[facility]   01/27/2024  1:15 PM Meda Klinefelter., MD [facility] [address] [place]   04/09/2024 10:15 AM Meda Klinefelter., MD [facility] [address] [address] [place]       Jaedin Trumbo A. Okey Dupre, MD

## 2023-06-20 ENCOUNTER — Ambulatory Visit: Payer: Self-pay

## 2023-06-20 ENCOUNTER — Other Ambulatory Visit: Payer: Self-pay | Attending: Student in an Organized Health Care Education/Training Program

## 2023-06-20 DIAGNOSIS — R760 Raised antibody titer: Secondary | ICD-10-CM

## 2023-06-20 DIAGNOSIS — A6004 Herpesviral vulvovaginitis: Secondary | ICD-10-CM

## 2023-06-20 DIAGNOSIS — O09519 Supervision of elderly primigravida, unspecified trimester: Secondary | ICD-10-CM

## 2023-06-20 DIAGNOSIS — I7300 Raynaud's syndrome without gangrene: Secondary | ICD-10-CM

## 2023-06-28 LAB — Chlamydia trachomatis/Neisseria gonorrhoeae PCR: NEISSERIA GONORRHOEAE PCR: NEGATIVE

## 2023-06-29 NOTE — L&D Delivery Note
 Delivery Summary      OB History       Gravida   1    Para   1    Term   1    Preterm   0    AB   0    Living   1         SAB   0    IAB   0    Ectopic   0    Multiple   0    Live Births   1          1   Outcome: Term       Date: 01/26/24      GA: [redacted]w[redacted]d      Sex: F    Type: Vag-Spont     Living: LIV         Name: [name] [name]  [name] [name]        Labor/2nd: / 2h 4m    Weight: 7 lb 4.4 oz (3.3 kg)    Anes: Epidural    A1: 7    A5: 9    [facility]: [facility]    Delivering Clinician: Raguel Eleanor [name]., MD               Prenatal Results      1st Trimester       Test Value Reference Range Date Time    Blood Type  B Positive   01/25/24 1748    Cord Blood Type        Antibody Screen  Negative   01/25/24 1748       Negative   07/02/23 1124    CBC & Platelet Count  Abnormal   (See Report)    01/25/24 1748       Abnormal   (See Report)    10/26/23 0906       Abnormal   (See Report)    08/18/23 1010       (See Report)    07/02/23 1124    HCT  37.5 % 34.9 - 45.2 07/02/23 1124    HGB  12.7 g/dL 88.3 - 84.7 98/95/74 8875    Rubella Immune Status  Positive  Negative 03/21/23 1505    RPR Screen  Nonreactive  Nonreactive 01/25/24 1817       Nonreactive  Nonreactive 07/02/23 1124    Urinalysis, Routine  Abnormal   (See Report)    02/02/23 0940    HBsAg  Nonreactive  Nonreactive 07/02/23 1124    HIV1/HIV2 Antibody Screen        HIV-1/2 Ag/Ab 4th Generation with Reflex Confirmation  Nonreactive  Nonreactive 07/02/23 1124    Gonorrhea        Chlamydia        TSH  1.1 mcIU/mL 0.3 - 4.7 02/02/23 0939    MTB-Quantiferon        External MTB-Quantiferon  Negative  Negative 08/18/23 1010    Pap        Platelet Count  289 x10E3/uL 143 - 398 07/02/23 1124    Varicella Immune Status  (See Report)    03/21/23 1505          2nd Trimester       Test Value Reference Range Date Time    HCT  32.6 % 34.9 - 45.2 10/26/23 0906       36.3 % 34.9 - 45.2 08/18/23 1010    HGB  10.6 g/dL 88.3 -  15.2 10/26/23 0906       12.1 g/dL 88.3 - 84.7 97/79/74 8989    Platelet Count  283 x10E3/uL 143 - 398 10/26/23 0906       275 x10E3/uL 143 - 398 08/18/23 1010    Glucose, Fasting              3rd Trimester       Test Value Reference Range Date Time    GBS PCR, Vaginal/Rectum  Not Detected  Not Detected 01/06/24 0928    GBS Culture Urine  No Growth at 1:1000 dilution   07/02/23 1058    RPR with TP-PA Confirmation  Nonreactive  Nonreactive 02/02/23 0939    TP-PA        TP-PA ([facility])              Genetic Screening       Test Value Reference Range Date Time    BUN  8 mg/dL 7 - 22 97/79/74 8989    Cystic Fibrosis        Canavan        Tay-Sachs        SMA  (See Report)    07/02/23 1124    Fragile X Mutation              Legend    ^: [name] [name], [name] [name]  [place] [[id number]]      Anesthesia    Method: Epidural  For 1% Lidocaine  use code:              Labor Events      Dilation Complete Date: 01/26/24 Dilation Complete Time: 1246 PDT   Rupture Date: 01/25/24  Rupture Time: 1200  Rupture type: SROM, Intact  Fluid color: Clear  Induction: Misoprostol , Oxytocin   Augmentation: None       Cervical Ripening       ROM to Delivery              ROM - Delivery:  1d 03h 64m          Shoulder Dystocia    Shoulder dystocia present: No        Time interval between delivery of fetal head and body:         McRoberts maneuver:        Suprapubic pressure:        Rubin maneuver:        Woods screw maneuver:        Gaskin maneuver:        Delivery of posterior arm:        Fetal clavicular fracture:        Symphysiotomy:        Zavanelli maneuver:     This SmartForm includes extensive scripting. If you would like to customize this form, please contact your [employer] representative       Labor Length       h  m 2nd Stage: 2 h 22 m 3rd Stage: 0 h 4 m          Delivery/C-Section Indication             Delivery date/time:  01/26/24 1508   Delivery type: Vaginal, Spontaneous   C-Section Details:            Print Group Title    Forceps attempted?: No  Vacuum extractor attempted?: No  Additional  forcep details:                  Document Additional Attempt         Document Additional Attempt          Additional vacuum details:                  Initial Stabilization    Respiration: Spontaneous  Treatment: Dry, Warmth, Vigorous Stimulation, Bulb Suction  Orogastric Suction?: No       Assessment    Living status: Living      Skin color:   Heart rate:   Reflex irritability:   Muscle tone:   Respiratory effort:   Total:            1 Minute:    1    2    1    2    1    7         5  Minute:    1    2    2    2    2    9         10  Minute:         15 Minute:         20 Minute:           Apgars assigned by: VALENCIA RN       Newborn Measurements    Weight: 3300 g  Length: 53 cm  Head circumference: 35 cm       Cord    Vessels: 3 vessels  Complications: None  Delayed cord clamping >= 30 seconds?: Yes  Cord clamped date/time: 01/26/2024  1:09 PM  Cord blood disposition: Refrigerator  Gases sent?: No  Stem cell collection?: No       Delivery Providers    Delivering clinician: Raguel Eleanor [name]., MD   Other personnel:  Provider Role   Shin, [place] G., MD Primary - Intern   [name] Eleanor An, RN Delivery Nurse   [name] Damien [place], RN Registered Nurse   [name] Ask, MD Consult - Chief Resident             Vaginal Delivery Counts      Instrument Tray #: [id number]        Counts:  Start:  Added/Final:          Instruments: 18 =18         Laps: 10 =10   Needles: 1 +3/4   Miscellaneous: 0 =0     Initial counts verified by [name] Cathlean Petit, RN    Hazdovac, Ludie Soja, RN         Final Count Review:   Final counts correct?: Yes   Physician notified?: Yes   Final counts verified by [name] Eleanor An, RN    Shin, [place] G., MD                Lacerations    Episiotomy: None  Perineal lacerations: 1st Repaired: Yes     Labial laceration: right Repaired: No     Sulcus laceration: left Repaired: Yes     Vaginal laceration: No      Cervical laceration: No    Vaginal delivery blood loss (mL): 150  Repair suture: 2-0 Vicryl CT-1  Number of repair packets: 1       Presentation    Presentation: Vertex  Position:  Occiput Anterior          Placenta  Date/time: 01/26/2024 1512  Removal: Spontaneous  Appearance: Intact  Disposition: Discarded            24 Hour QBL from Time of Delivery   Intrapartum & Postpartum: 01/26/24   0308 - 01/27/24 1334    Delivery Admission: 01/25/24 1327 - 01/27/24 1334       Intrapartum & Postpartum Delivery Admission    Blood Output Hospital Encounter 150 mL 150 mL    Total  150 mL 150 mL        Department of Obstetrics and Gynecology  Delivery Note    01/27/2024  [name] [name] is a 38 y.o. G1P1001 at [redacted]w[redacted]d who was admitted on 01/25/2024 for induction of labor. Pregnancy was complicated by hx beta-2 glycoprotein I +, HSV, IDA, lumbar disc herniation/mild scoliosis.  Pt underwent induction of labor with misoprostol  and pitocin .  FHT notable for rare variable decels right before she was complete, but overall category 1 tracing. She progressed normally to complete. She had a normal spontaneous vaginal delivery of a live female infant in ROA position. Baby was delivered without difficulty with Apgars of 7 and 9. No nuchal cord. Delivery was uncomplicated.  The infant was vigorous at delivery so cord was clamped and cut after 60s delay. Infant was resuscitated on the maternal abdomen. Cord gases were not sent.  Placenta delivered with gentle downward traction.   After delivery the uterine fundus was noted to be firm and the lower segment was cleared of clot. Pt received pitocin . 1st degree perineal laceration that was hemostatic without repair. Left peri-clitoral laceration as repaired with a figure of 8 with 3-0 Vicryl. R sulcal laceration repaired with one simple interrupted stitch with 3-0 Vicryl and simple running stitch with 2-0 Vicryl. Estimated blood loss 150 cc. Instrument and gauze counts correct at end of procedure.   The attending provider, Dr. Raguel, was present for the delivery and repair.    Tobias Hawthorn, MD, PhD 01/27/2024 1:34 PM  Maternal Fetal Medicine Fellow ADDENDUM ------------------------------------------------  I was present and scrubbed for the entire procedure. I agree with the findings as documented in the resident's note along with my additions and/or corrections.     Eleanor Raguel, MD   Tobias Hawthorn, MD, PhD

## 2023-06-30 ENCOUNTER — Other Ambulatory Visit: Payer: Self-pay

## 2023-07-04 ENCOUNTER — Other Ambulatory Visit: Payer: Self-pay

## 2023-07-05 ENCOUNTER — Ambulatory Visit: Payer: Self-pay | Attending: Rheumatology

## 2023-07-05 ENCOUNTER — Other Ambulatory Visit: Payer: Self-pay

## 2023-07-05 ENCOUNTER — Telehealth: Payer: Self-pay

## 2023-07-05 DIAGNOSIS — I7300 Raynaud's syndrome without gangrene: Secondary | ICD-10-CM

## 2023-07-05 MED ORDER — HYDROXYCHLOROQUINE SULFATE 200 MG PO TABS
200 mg | ORAL_TABLET | Freq: Two times a day (BID) | ORAL | 2 refills | Status: AC
Start: 2023-07-05 — End: ?
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
## 2023-07-05 NOTE — Telephone Encounter
 Called and spoke to patient to schedule 3 month follow up.

## 2023-07-11 ENCOUNTER — Other Ambulatory Visit: Payer: Self-pay

## 2023-07-13 ENCOUNTER — Telehealth: Payer: Self-pay

## 2023-07-13 ENCOUNTER — Other Ambulatory Visit: Payer: Self-pay

## 2023-07-13 ENCOUNTER — Ambulatory Visit: Payer: Self-pay

## 2023-07-13 DIAGNOSIS — Z3682 Encounter for antenatal screening for nuchal translucency: Secondary | ICD-10-CM

## 2023-07-13 DIAGNOSIS — Z3689 Encounter for other specified antenatal screening: Secondary | ICD-10-CM

## 2023-07-13 NOTE — Telephone Encounter
Scheduled accordingly

## 2023-07-13 NOTE — Telephone Encounter
-----   Message from Modena Nunnery, MD sent at 07/13/2023 10:01 AM PST -----  MFM team - please reach out to schedule the following:    16-28 weeks:  Every 2 weeks   32, 36 weeks:  Growth [place]   20-22 weeks:  Fetal echo     MDR team - Please help her move her prenatal visits to MDR Obs under any provider.

## 2023-07-13 NOTE — Telephone Encounter
Spoke to pt to assist in rescheduling her prenatal visits, pt has been scheduled out all the way until her 36week with Dr.Goldrath. Was unable to reschedule the remainder of 37-40 & PPV appts due to Mds scheduled not being released for the remainder of the year after June. Practice manager Katrina has been notified regarding Mds schedule. Advised pt we may assist in rescheduling the remainder of her appts when schedule becomes available.

## 2023-07-13 NOTE — Telephone Encounter
-----   Message from [facility] [place] sent at 07/13/2023 10:27 AM PST -----  Hi Dr. Janee Morn,     All appts with MFM set. For 28 wks though she will be out of town  from 05/08- 05/18. I booker her in at 27 weeks but she did ask if her 32 week appointment should be moved to 30 weeks to ensure all is well.  ----- Message -----  From: Modena Nunnery., MD  Sent: 07/13/2023  10:02 AM PST  To: Obgyn Mfm Admin Staff; Obgyn Mdr Admin Staff    MFM team - please reach out to schedule the following:    16-28 weeks:  Every 2 weeks   32, 36 weeks:  Growth [place]   20-22 weeks:  Fetal echo     MDR team - Please help her move her prenatal visits to MDR Obs under any provider.

## 2023-07-14 ENCOUNTER — Telehealth: Payer: Self-pay

## 2023-07-14 NOTE — Telephone Encounter
 Called patient and left a voicemail to call [phone] to schedule fetal echo.

## 2023-07-20 NOTE — Telephone Encounter
Called patient to schedule fetal echo. Informed her of Dr. Estanislado Emms availability said she needs a morning appointment 08/18/23. Informed her I would give her a call back tomorrow morning once I talk to Dr. Estanislado Emms.

## 2023-07-21 ENCOUNTER — Other Ambulatory Visit: Payer: Self-pay

## 2023-07-22 ENCOUNTER — Other Ambulatory Visit: Payer: Self-pay

## 2023-07-27 NOTE — Telephone Encounter
Called patient and scheduled fetal echo with Dr. Estanislado Emms for 08/18/23

## 2023-08-02 ENCOUNTER — Other Ambulatory Visit: Payer: Self-pay

## 2023-08-10 DIAGNOSIS — I7300 Raynaud's syndrome without gangrene: Secondary | ICD-10-CM

## 2023-08-10 DIAGNOSIS — A6004 Herpesviral vulvovaginitis: Secondary | ICD-10-CM

## 2023-08-10 NOTE — Progress Notes
 OB OUTPATIENT PROGRESS NOTE    [name] [name] is a 38 y.o. G1P0 at [redacted]w[redacted]d here for routine prenatal care.     Pregnancy complicated by   1)Pos antiphospholipid antibodies and SSA antibodies: no clinical features of APS, seen by MFM, options to treat as APS with lovenox 40 mg daily and LDASA or monitor, already on LDASA, rec lovenox, hesitant, opted for no lovenox   2)AMA: [x]  LDASA  3)Lumbar disc herniation with sciatica  4)HSV: [ ]  acyclovir at 36 weeks  5)SSA Ab [id number], - endo rec to start HCQ, MFM said okay to wait, q2week scans, [ ]  fetal echo, [ ]  NST at 34 weeks, [ ]  IOL at 39 weeks      Patient Active Problem List   Diagnosis    Raynaud phenomenon    Globus sensation    Heat intolerance    Lumbar disc disease with radiculopathy    Hyperlipidemia    Herpes genitalis    Paresthesia of arm    Anti-cardiolipin antibody positive    Chronic constipation    External hemorrhoid    Supervision of high-risk pregnancy of elderly primigravida    Antiphospholipid antibody positive       SUBJECTIVE: [name] [name] has no unusual complaints. Denies fevers, chills, vomiting, dysuria, flank pain, or abnormal vaginal discharge.    notes constipation.     Cramping: No   Vaginal bleeding: No   Leakage of fluid: No   Fetal movement: No     OBJECTIVE:   BP 92/60  ~ Pulse 80  ~ Temp 36.2 ?C (97.1 ?F)  ~ Wt 127 lb 3.2 oz (57.7 kg)  ~ LMP 04/22/2023  ~ BMI 22.53 kg/m?     General: NAD  Extremities: LE edema: No, calf erythema/TTP: No     FHR: 150s bpm  FH: n/a    ASSESSMENT/PLAN:  [name] [name] is a 38 y.o. G1P0 at [redacted]w[redacted]d.     Patient Active Problem List    Diagnosis Date Noted    Supervision of high-risk pregnancy of elderly primigravida 06/20/2023     EDC: by LMP c/w 8 week scan    Partner: [name] [name]    Prenatal Labs:   Blood T&S: B pos  Rubella: immune  VZV: immune  Measles: immune  HBsAg: neg  Hep C: neg  HIV: neg  RPR: neg  GC/CT: neg/neg  MTB gold: neg  CBC (1st tri): 12.7/37.5<289  Urine culture: neg  Pap: 04/2019, [ ]  PP  1h GTT (24-28 wks)  CBC (3rd tri)  GBS (~36 wks)    Genetic Panel:   CF carrier screen neg  SMA carrier screen neg  NIPT screen: neg  NT (11+2 to 14+2 wks): 1.32 mm  2nd tri screen (15+0 to 20+0 wks)  Anatomy scan    Ultrasounds:  06/20/2023: [redacted]w[redacted]d by LMP, [redacted]w[redacted]d by scan.   07/13/2023: [redacted]w[redacted]d by LMP, [redacted]w[redacted]d by scan. NT 1.43mm.     ppBCM: discuss at viability    Vaccines: Tdap>27 weeks, influenza during season    Delivery Plan: anticipate SVD    Counseling:  06/20/2023 [redacted]w[redacted]d Discussed group practice and resident involvement in care, healthy weight gain and nutrition during pregnancy, upcoming genetic screening and 1st trimester [place], exercise and sexual activity during pregnancy and travel and related precautions.  Provided QR code for [employer] information.  NIPT, prenatal labs ordered today. Patient is a candidate for baby ASA: Yes. HgA1c ordered: No.   SAB Precautions. Discussed APLS - recommendation for LDASA (already  on) and lovenox ppx. Confirmed with MFM recommendation. Discussed HSV in pregnancy. Discussed testing SSA/SSB antibodies.  08/11/2023 [redacted]w[redacted]d Decided against lovenox. Screening scheduled or SSA pos. 2nd tri screen today. Anatomy scan ordered for next visit. Discussed medications for constipation. SAB prec reviewed. Transferring to MDR due to proximity to house. Would like CNM involvement in delivery if possible. We also discussed doula.          Antiphospholipid antibody positive 06/20/2023     Prepregnancy MFM consult  Options for presumptive treatment with LDASA and PPX lovenox vs monitoring.  Already on LDASA  [ ]  lovenox      Chronic constipation 04/04/2023     04/04/2023 Stable. Sometimes up to 5 days. Managing with fiber supp and occasional laxatives. She will schedule with GI as ordered by Dr. Alanson Aly.      External hemorrhoid 04/04/2023     04/04/2023 Noted on prior exam 12/2021. Occasionally inflamed. Start hydrocortisone rectal prn. Discussed behavioral modifications.      Paresthesia of arm 04/07/2022 04/07/2022 New onset in the past 6-12 months. 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 Also with positive SSA. Due for labs ordered by Rheumatology. Will draw today. F/u with Rheumatology.  Rheumatology: Dr Harrie Foreman      Herpes genitalis 01/12/2022     04/07/2022 Recent recurrence. Resolved with round of valacyclovir.  - continue valacyclovir 500mg  BID for 3 days prn recurrences    [ ]  36 week acyclovir suppression      Hyperlipidemia 04/26/2021     04/07/2022 Diet controlled. Not in statin benefit group  - low cholesterol, moderate carb, heart healthy diet, and exercise at least 150 min/week        Lumbar disc disease with radiculopathy 01/04/2020    Raynaud phenomenon 04/20/2018     04/07/2022  Stable. Extensive workup with Rheumatology positive for SSA and anticardiolipin, but no signs of connective tissue disease on history/exam.  Rheumatology: Dr Harrie Foreman  - consider CCB if more bothersome  - c/w Rheumatology    [ ]  SSA/SSB antibodies ordered 06/20/2023 in 1st trimester of pregnancy      Globus sensation 04/20/2018    Heat intolerance 04/20/2018       No orders of the defined types were placed in this encounter.      No follow-ups on file. Sooner if needed.     Future Appointments   Date Time Provider Department Center   08/17/2023  8:15 AM MDROB [place] 01-OB/GYN MDRMFM [facility]   08/17/2023  8:20 AM Modena Nunnery., MD [facility]   08/18/2023  8:00 AM MP2 FETAL ECHO 01 RM 330-44 CI MP2PEDS [place]/Cen   09/01/2023  8:40 AM Modena Nunnery., MD OBGYNMDR [facility]   09/14/2023  8:40 AM Modena Nunnery., MD [facility] [place]   09/28/2023  8:40 AM Modena Nunnery., MD [facility] [place]   10/04/2023  9:00 AM Harrie Foreman, MD [facility] [place]/Cen   10/10/2023  9:00 AM Avis Epley., MD [facility]   10/13/2023  8:20 AM Nelia Shi., MD [facility] 10/26/2023  8:00 AM Modena Nunnery., MD OBGYNMDR [facility]   11/02/2023  8:00 AM Modena Nunnery., MD [facility] [place]   11/07/2023  8:15 AM Avis Epley., MD OBGYNMDR [place]   12/05/2023  9:00 AM  Avis Epley., MD OBGYNMDR [facility]   12/07/2023  8:00 AM Modena Nunnery., MD [facility] [place]   12/19/2023  9:15 AM Avis Epley., MD [facility]   01/02/2024  9:15 AM Meda Klinefelter., MD OBG MP2 [address]/Cen   01/16/2024 10:15 AM Meda Klinefelter., MD OBG MP2 [address]/Cen   01/23/2024 10:30 AM Meda Klinefelter., MD OBG MP2 [address]/Cen   01/27/2024  1:15 PM Meda Klinefelter., MD OBG MP2 [address] [place]/Cen   04/09/2024 10:15 AM Meda Klinefelter., MD OBG MP2 [address]/Cen       Yunis Voorheis A. Okey Dupre, MD

## 2023-08-11 ENCOUNTER — Ambulatory Visit: Payer: BLUE CROSS/BLUE SHIELD | Attending: Student in an Organized Health Care Education/Training Program

## 2023-08-16 ENCOUNTER — Other Ambulatory Visit: Payer: BLUE CROSS/BLUE SHIELD

## 2023-08-17 ENCOUNTER — Ambulatory Visit: Payer: BLUE CROSS/BLUE SHIELD

## 2023-08-17 DIAGNOSIS — R760 Raised antibody titer: Secondary | ICD-10-CM

## 2023-08-17 DIAGNOSIS — Z3689 Encounter for other specified antenatal screening: Secondary | ICD-10-CM

## 2023-08-18 ENCOUNTER — Inpatient Hospital Stay: Payer: BLUE CROSS/BLUE SHIELD

## 2023-08-18 DIAGNOSIS — O359XX0 Maternal care for (suspected) fetal abnormality and damage, unspecified, not applicable or unspecified: Secondary | ICD-10-CM

## 2023-08-20 ENCOUNTER — Other Ambulatory Visit: Payer: BLUE CROSS/BLUE SHIELD

## 2023-08-30 ENCOUNTER — Ambulatory Visit: Payer: BLUE CROSS/BLUE SHIELD

## 2023-09-01 ENCOUNTER — Ambulatory Visit: Payer: Self-pay

## 2023-09-09 ENCOUNTER — Ambulatory Visit: Payer: Self-pay

## 2023-09-13 ENCOUNTER — Ambulatory Visit: Payer: BLUE CROSS/BLUE SHIELD

## 2023-09-14 ENCOUNTER — Ambulatory Visit: Payer: BLUE CROSS/BLUE SHIELD

## 2023-09-14 DIAGNOSIS — I7300 Raynaud's syndrome without gangrene: Secondary | ICD-10-CM

## 2023-09-21 ENCOUNTER — Other Ambulatory Visit: Payer: Self-pay

## 2023-09-28 ENCOUNTER — Ambulatory Visit: Payer: Self-pay

## 2023-10-04 ENCOUNTER — Ambulatory Visit: Payer: Self-pay | Attending: Rheumatology

## 2023-10-04 ENCOUNTER — Other Ambulatory Visit: Payer: Self-pay

## 2023-10-10 ENCOUNTER — Other Ambulatory Visit: Payer: Self-pay

## 2023-10-10 ENCOUNTER — Ambulatory Visit: Payer: Self-pay

## 2023-10-10 ENCOUNTER — Ambulatory Visit: Payer: Self-pay | Attending: Student in an Organized Health Care Education/Training Program

## 2023-10-10 DIAGNOSIS — I7300 Raynaud's syndrome without gangrene: Secondary | ICD-10-CM

## 2023-10-10 DIAGNOSIS — Z1371 Encounter for nonprocreative screening for genetic disease carrier status: Secondary | ICD-10-CM

## 2023-10-10 NOTE — Progress Notes
 Department of Obstetrics and Gynecology  Outpatient Obstetrics Progress Note    DATE OF VISIT: 10/10/2023    CC: Prenatal follow-up    Subjective:   [name] [name] is doing well, denies UCs, LOF, VB, VD, dysuria, and RUQ pain. +FM    Transfer from Dr. Jolly Needle, lives much closer here  One time episode of vision changes and dizziness that she messaged Dr. Barkley Boot about previously on 10/03/23 that resolved. Has been monitoring her BP since then.       No current outpatient medications on file.     No current facility-administered medications for this visit.      No Known Allergies    Objective:   BP 92/59  ~ Pulse 90  ~ Wt 135 lb 9.6 oz (61.5 kg)  ~ LMP 04/22/2023  ~ BMI 24.02 kg/m?   Wt Readings from Last 3 Encounters:   10/10/23 135 lb 9.6 oz (61.5 kg)   09/28/23 133 lb 12.8 oz (60.7 kg)   09/14/23 132 lb 12.8 oz (60.2 kg)       FH: WNL, see Ob vitals flowsheet  FHT: WNL, see Ob vitals flowsheet    Physical Exam: deferred      Imaging:    Assessment/Plan:   [name] is a 38 y.o. G1P0 at [redacted]w[redacted]d who presents for follow-up prenatal visit.    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 We discussed that this pathogenic variant and the duplication are often found in cis (on the same chromosome) and that this configuration is associated with normal gene function (i.e. does not pose a carrier risk to a pregnancy).  -declined amniocentesis, defer to neonate testing by peds      Raised antibody titer 08/17/2023    Supervision of high-risk pregnancy of elderly primigravida 06/20/2023     EDC: by LMP c/w 8 week scan    Partner: Ruffus Couch    Prenatal Labs:   Blood T&S: B pos  Rubella: immune  VZV: immune  Measles: immune  HBsAg: neg  Hep C: neg  HIV: neg  RPR: neg  GC/CT: neg/neg  MTB gold: neg  CBC (1st tri): 12.7/37.5<289  Urine culture: neg  Pap: 04/2019, [ ]  PP  1h GTT (24-28 wks)  CBC (3rd tri)  GBS (~36 wks)    Genetic Panel:   CF carrier screen neg  SMA carrier screen neg  NIPT screen: neg  NT (11+2 to 14+2 wks): 1.32 mm  2nd tri screen (15+0 to 20+0 wks) negative  Anatomy scan: wnl    Ultrasounds:  06/20/2023: [redacted]w[redacted]d by LMP, [redacted]w[redacted]d by scan.   07/13/2023: [redacted]w[redacted]d by LMP, 100w6d by scan. NT 1.18mm.   09/28/2023 22w 5d 56.8 71% 208.3 45% 183.7 57% 40 45% 38.1 65% 560 59%     ppBCM: discuss at viability    Vaccines: Tdap>27 weeks, influenza during season    Delivery Plan: anticipate SVD    Counseling:  06/20/2023 [redacted]w[redacted]d Discussed group practice and resident involvement in care, healthy weight gain and nutrition during pregnancy, upcoming genetic screening and 1st trimester US , exercise and sexual activity during pregnancy  and travel and related precautions.  Provided QR code for [employer] information.  NIPT, prenatal labs ordered today. Patient is a candidate for baby ASA: Yes. HgA1c ordered: No.   SAB Precautions. Discussed APLS - recommendation for LDASA (already on) and lovenox ppx. Confirmed with MFM recommendation. Discussed HSV in pregnancy. Discussed testing SSA/SSB antibodies.  08/11/2023 [redacted]w[redacted]d Decided against lovenox. Screening scheduled or SSA pos. 2nd tri screen today. Anatomy scan ordered for next visit. Discussed medications for constipation. SAB prec reviewed. Transferring to MDR due to proximity to house. Would like CNM involvement in delivery if possible. We also discussed doula.          Antiphospholipid antibody positive 06/20/2023     Prepregnancy MFM consult  Options for presumptive treatment with LDASA and PPX lovenox vs monitoring.  Already on LDASA  [ ]  lovenox      Chronic constipation 04/04/2023     04/04/2023 Stable. Sometimes up to 5 days. Managing with fiber supp and occasional laxatives. She will schedule with GI as ordered by Dr. Steven Elam.      External hemorrhoid 04/04/2023     04/04/2023 Noted on prior exam 12/2021. Occasionally inflamed. Start hydrocortisone  rectal prn. Discussed behavioral modifications.      Paresthesia of arm 04/07/2022     04/07/2022 New onset in the past 6-12 months. 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 Also with positive SSA. Due for labs ordered by Rheumatology. Will draw today. F/u with Rheumatology.  Rheumatology: Dr Velora Ghent      Herpes genitalis 01/12/2022     04/07/2022 Recent recurrence. Resolved with round of valacyclovir .  - continue valacyclovir  500mg  BID for 3 days prn recurrences    [ ]  36 week acyclovir suppression      Hyperlipidemia 04/26/2021     04/07/2022 Diet controlled. Not in statin benefit group  - low cholesterol, moderate carb, heart healthy diet, and exercise at least 150 min/week        Lumbar disc disease with radiculopathy 01/04/2020    Raynaud phenomenon 04/20/2018     04/07/2022  Stable. Extensive workup with Rheumatology positive for SSA and anticardiolipin, but no signs of connective tissue disease on history/exam.  Rheumatology: Dr Velora Ghent  - consider CCB if more bothersome  - c/w Rheumatology    [x]  SSA/SSB antibodies ordered 06/20/2023 in 1st trimester of pregnancy-elevated 06/2023      Globus sensation 04/20/2018    Heat intolerance 04/20/2018       -Anatomy scan results reviewed  -1hr PG, CBC today  -Patient counseled regarding general pregnancy issues including:  labor course discussed at length, option of childbirth classes, recommendations for pediatricians: prenatal visit recommended, labor and delivery tour, and preterm labor precautions.    Today is my first day seeing this patient and discussed diagnosis of APLS given lab findings. Patient is on bASA during the pregnancy. Asked what plan was for postpartum, patient reports she was not told she would need AC. I discussed my recommendation for ppx AC especially if the patient has a C/S. Also discussed recommendation for IOL at 39 wks given auto immune disease, patient was also not aware of this. Will discuss with Dr. Barkley Boot as well.   She also had questions about the ''possible carrier of CAH'' for both her and her husband, I offered an amniocentesis but she said if there  is nothing to do at delivery then she would wait for the baby to be tested    RTC 4 weeks    Orders Placed This Encounter    Glucose Tol., Gest. 1h 50g    CBC & Platelet Count    RPR       Future Appointments   Date Time Provider Department Center   10/13/2023  8:20 AM Vernard Goldberg., MD [facility]   10/26/2023  8:00 AM Martell Skinner., MD OBGYNMDR [facility]   11/02/2023  8:00 AM Martell Skinner., MD [facility]   11/14/2023  7:45 AM Reginia Caprice., MD [facility]NMDR [facility]   12/05/2023  9:00 AM Leslie Rasp., MD [facility]   12/07/2023  8:00 AM Martell Skinner., MD [facility]   12/19/2023  9:15 AM Leslie Rasp., MD [facility]NM[facility]   01/03/2024  8:15 AM Leslie Rasp., MD [facility]DR [facility]   01/10/2024  2:15 PM Leslie Rasp., MD [facility]   01/23/2024 10:15 AM Leslie Rasp., MD [facility]   01/30/2024 10:15 AM Leslie Rasp., MD [facility]   04/09/2024  9:15 AM Leslie Rasp., MD [facility]       @SIGN @

## 2023-10-13 ENCOUNTER — Ambulatory Visit: Payer: Self-pay

## 2023-10-26 ENCOUNTER — Telehealth: Payer: Self-pay

## 2023-10-26 ENCOUNTER — Ambulatory Visit: Payer: Self-pay

## 2023-10-26 DIAGNOSIS — I7300 Raynaud's syndrome without gangrene: Secondary | ICD-10-CM

## 2023-10-26 LAB — CBC: HEMATOCRIT: 32.6 % — ABNORMAL LOW (ref 34.9–45.2)

## 2023-10-26 LAB — Glucose Tol., Gest. 1h 50g: GLUCOSE,1 HOUR GEST1HR50G: 81 mg/dL

## 2023-10-26 NOTE — Telephone Encounter
 Called pt per pt request in regards to scheduling remainder utz appts. Lm to call back our office to coordinate appt. Pt is already scheduled up to 32wks will just need 36wks can offer 7/11 with dr.Han. Pending cb.

## 2023-10-26 NOTE — Telephone Encounter
 Call Back Request      Reason for call back: Patient called in to schedule her remaining appointments with Dr. Barkley Boot. Per patient MD advised to schedule 32 and 36 week appointments. Patient inquired if she should be scheduled for her 40th week as well. Patient would like Wed or Thursdays first thing in the morning with Dr. Barkley Boot. Please advise. Thank you.     Patient advised she is 28 weeks - next week.     Any Symptoms:  []  Yes  [x]  No      If yes, what symptoms are you experiencing:    Duration of symptoms (how long):    Have you taken medication for symptoms (OTC or Rx):      If call was taken outside of clinic hours:    [] Patient or caller has been notified that this message was sent outside of normal clinic hours.     [] Patient or caller has been warm transferred to the physician's answering service. If applicable, patient or caller informed to please call us  back if symptoms progress.  Patient or caller has been notified of the turnaround time of 1-2 business day(s).

## 2023-10-26 NOTE — Telephone Encounter
 Pt called per pt request and offered appt. Pt confirmed for 7/15 but pt would be 37wks+5. Pt was concerned that pt would be 37wks then but pt stated she will confirm with MD to confirm if okay to keep or to move sooner appt. Pt confirmed current appt.

## 2023-10-27 ENCOUNTER — Ambulatory Visit: Payer: Self-pay

## 2023-10-27 MED ORDER — FERROUS SULFATE 325 (65 FE) MG PO TBEC
325 mg | ORAL_TABLET | ORAL | 1 refills | 30.00 days | Status: AC
Start: 2023-10-27 — End: ?

## 2023-10-28 ENCOUNTER — Other Ambulatory Visit: Payer: Self-pay

## 2023-11-02 ENCOUNTER — Ambulatory Visit: Payer: Self-pay

## 2023-11-02 ENCOUNTER — Other Ambulatory Visit: Payer: Self-pay

## 2023-11-02 DIAGNOSIS — I7300 Raynaud's syndrome without gangrene: Secondary | ICD-10-CM

## 2023-11-07 ENCOUNTER — Ambulatory Visit: Payer: Self-pay | Attending: Student in an Organized Health Care Education/Training Program

## 2023-11-07 ENCOUNTER — Ambulatory Visit: Payer: Self-pay

## 2023-11-14 ENCOUNTER — Ambulatory Visit: Payer: Self-pay

## 2023-11-14 ENCOUNTER — Other Ambulatory Visit: Payer: Self-pay

## 2023-11-14 ENCOUNTER — Other Ambulatory Visit: Payer: BLUE CROSS/BLUE SHIELD

## 2023-11-14 MED ADMIN — TETANUS-DIPHTH-ACELL PERTUSSIS 5-2.5-18.5 LF-MCG/0.5 IM SUSY: .5 mL | INTRAMUSCULAR | @ 16:00:00 | Stop: 2023-11-14

## 2023-11-14 NOTE — Progress Notes
 Department of Obstetrics and Gynecology  Outpatient Obstetrics Progress Note    DATE OF VISIT: 11/14/2023    CC: Prenatal follow-up    Subjective:   [name] [name] is doing well, denies UCs, LOF, VB, VD, dysuria, HA, RUQ pain, and vision changes. +FM    Was having some significant cramping in pelvis yesterday that lasted all day that she thinks was correlated with picking up something/bending.  Also epigastric pain yesterday that was stabbing that has since resolved. Sometimes RUQ pain with walking.  Has multiple questions regarding L&D, midwife care, will plan to bring in birth plan to discuss.   Asked necessity of repeat CBC because she is a hard stick, currently on iron supplements  Also questions regarding need for NST for fetus as recommended in Dr. Mackie Sayre note.    Current Outpatient Medications   Medication Sig    ferrous sulfate 325 (65 FE) mg EC tablet Take 1 tablet (325 mg total) by mouth every other day.    Prenatal Vit-Fe Fumarate-FA (PRENATAL VITAMIN) tablet Take 1 tablet by mouth daily.     Current Facility-Administered Medications   Medication Dose Route Frequency    [COMPLETED] Tdap vaccine (Boostrix) inj (Age 37 years and older) 0.5 mL  0.5 mL Intramuscular Once      No Known Allergies    Objective:   BP 95/65  ~ Pulse 89  ~ Wt 138 lb 12.8 oz (63 kg)  ~ LMP 04/22/2023  ~ BMI 24.59 kg/m?   Wt Readings from Last 3 Encounters:   11/14/23 138 lb 12.8 oz (63 kg)   11/02/23 137 lb 12.8 oz (62.5 kg)   10/26/23 139 lb (63 kg)       FH: WNL, see Ob vitals flowsheet  FHT: WNL, see Ob vitals flowsheet    Physical Exam: deferred    Imaging:      Assessment/Plan:   [name] is a 38 y.o. G1P0 at [redacted]w[redacted]d who presents for follow-up prenatal visit.    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 We discussed that this pathogenic variant and the duplication are often found in cis (on the same chromosome) and that this configuration is associated with normal gene function (i.e. does not pose a carrier risk to a pregnancy).  -declined amniocentesis, defer to neonate testing by peds      Raised antibody titer 08/17/2023    Supervision of high-risk pregnancy of elderly primigravida 06/20/2023     EDC: by LMP c/w 8 week scan    Partner: Ruffus Couch    Prenatal Labs:   Blood T&S: B pos  Rubella: immune  VZV: immune  Measles: immune  HBsAg: neg  Hep C: neg  HIV: neg  RPR: neg  GC/CT: neg/neg  MTB gold: neg  CBC (1st tri): 12.7/37.5<289  Urine culture: neg  Pap: 04/2019, [ ]  PP  1h GTT (24-28 wks)  CBC (3rd tri)  GBS (~36 wks)  Genetic Panel:   CF carrier screen neg  SMA carrier screen neg  NIPT screen: neg  NT (11+2 to 14+2 wks): 1.32 mm  2nd tri screen (15+0 to 20+0 wks) negative  Anatomy scan: wnl    Ultrasounds:  Exam date        GA              BPD (mm)          HC (mm)              AC (mm)               FL (mm)             HL (mm)            EFW (g)  07/13/2023        11w 6d         19.7                   66.5                    53.4                     5.7                     7.8  08/17/2023        16w 6d        36     58%           135.7    49%        107    41%           20.4     17%        22.8    64%        156     20%  09/01/2023          18w 6d        41.5    38%         157.6    32%        131    38%           29.4     51%        27.9    55%        258     41%  09/14/2023        20w 5d        49.6     62%        184.1    44%        159.9     57%        35.1    54%        33.6     73% 396    63%  09/28/2023          22w 5d        56.8    71%         208.3    45%        183.7    57%         40    45%           38.1    65%        560     59%  10/13/2023  24w 6d        62.8     64%        231.9    42%        203.7     45%        49.6    88%        44.7     89%        837    75%  10/26/2023        26w 5d        66.8     46%        249.8    36%        225.1     48%        49.3    32%        46     59%           989    42%  11/02/2023          27w 5d        69.8    49%         260.2    38%        233    40%           52.9     47%        48.8    71%        1143     44%    ppBCM: discuss at viability    Vaccines: Tdap>27 weeks done 11/14/23, influenza during season    Delivery Plan: anticipate SVD  Per MFM:  Growth: q 4 weeks  NST: start at 34-36 weeks  Delivery: 39+ weeks or sooner if additional indications    Counseling:  06/20/2023 [redacted]w[redacted]d Discussed group practice and resident involvement in care, healthy weight gain and nutrition during pregnancy, upcoming genetic screening and 1st trimester US , exercise and sexual activity during pregnancy and travel and related precautions.  Provided QR code for [employer] information.  NIPT, prenatal labs ordered today. Patient is a candidate for baby ASA: Yes. HgA1c ordered: No.   SAB Precautions. Discussed APLS - recommendation for LDASA (already on) and lovenox ppx. Confirmed with MFM recommendation. Discussed HSV in pregnancy. Discussed testing SSA/SSB antibodies.  08/11/2023 [redacted]w[redacted]d Decided against lovenox. Screening scheduled or SSA pos. 2nd tri screen today. Anatomy scan ordered for next visit. Discussed medications for constipation. SAB prec reviewed. Transferring to MDR due to proximity to house. Would like CNM involvement in delivery if possible. We also discussed doula.          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 Eloy Half preconception   Opted for LDASA  [ ]  lovenox postpartum for 6 weeks in setting of c section       Chronic constipation 04/04/2023     04/04/2023 Stable. Sometimes up to 5 days. Managing with fiber supp and occasional laxatives. She will schedule with GI as ordered by Dr. Steven Elam.      External hemorrhoid 04/04/2023     04/04/2023 Noted on prior exam 12/2021. Occasionally inflamed. Start hydrocortisone rectal prn. Discussed behavioral modifications.      Paresthesia of arm 04/07/2022     04/07/2022 New onset in the past 6-12 months. 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 Also with positive SSA. Due for labs ordered by Rheumatology. Will draw today. F/u with Rheumatology.  Rheumatology: Dr Velora Ghent      Herpes genitalis 01/12/2022     04/07/2022 Recent recurrence. Resolved with round of valacyclovir.  - continue valacyclovir 500mg  BID for 3 days prn recurrences    [ ]  36 week acyclovir suppression      Hyperlipidemia 04/26/2021     04/07/2022 Diet controlled. Not in statin benefit group  - low cholesterol, moderate carb, heart healthy diet, and exercise at least 150 min/week        Lumbar disc disease with radiculopathy 01/04/2020     Known hx of scoliosis and herniated discs  [ ]  referral to PEP-C for OB anesthesia given hx and APLS antibodies      Raynaud phenomenon 04/20/2018     04/07/2022  Stable. Extensive workup with Rheumatology positive for SSA and anticardiolipin, but no signs of connective tissue disease on history/exam.  Rheumatology: Dr Velora Ghent  - consider CCB if more bothersome  - c/w Rheumatology    [x]  SSA/SSB antibodies ordered 06/20/2023 in 1st trimester of pregnancy-elevated 06/2023  - declined STOP BLOQ  - s/p PR interval monitoring in pregnancy      Globus sensation 04/20/2018    Heat intolerance 04/20/2018     -Discussed glucose screening results  -Discussed CBC results, plan for repeat  -Rh status:  no RhoGAM indicated..  -Patient counseled regarding general pregnancy issues including:  labor course discussed at length, option of childbirth classes, recommendations for pediatricians: prenatal visit recommended, anesthetic options, labor and delivery tour, and preterm labor precautions.    RTC 4 weeks    Orders Placed This Encounter    US  ob amniotic fluid index+NST    CBC & Platelet Count    Ferritin    Referral to [facility] Pre-Anes Clinic Visit    Tdap vaccine (Boostrix) inj (Age 28 years and older) 0.5 mL       Future Appointments   Date Time Provider Department Center   12/05/2023  9:00 AM Leslie Rasp., MD [facility]   12/07/2023  7:55 AM MDROB US  01-OB/GYN MDRMFM [facility]   12/07/2023  8:00 AM Martell Skinner., MD [facility]   12/19/2023  9:15 AM Leslie Rasp., MD [facility]   01/03/2024  8:15 AM Leslie Rasp., MD [facility]   01/10/2024  9:40 AM Martell Skinner., MD OBG[facility]NMDR [facility]   01/10/2024  2:15 PM Leslie Rasp., MD [facility]   01/23/2024 10:15 AM Leslie Rasp., MD [facility]   01/30/2024 10:15 AM Leslie Rasp., MD [facility]   04/09/2024  9:15 AM Leslie Rasp., MD [facility]       @SIGN @

## 2023-11-15 DIAGNOSIS — D508 Other iron deficiency anemias: Secondary | ICD-10-CM

## 2023-11-15 DIAGNOSIS — I7300 Raynaud's syndrome without gangrene: Secondary | ICD-10-CM

## 2023-11-16 ENCOUNTER — Telehealth: Payer: BLUE CROSS/BLUE SHIELD

## 2023-11-16 NOTE — Telephone Encounter
 Left message for pt to call back [phone] and schedule [facility] Anesthesia consultation.    ----- Message from Leslie Rasp, MD sent at 11/14/2023  8:44 AM PDT -----  Regarding: Order for Ouch,Nyleah    Patient Name: OZHYQM,VHQIO([id number])  Sex: Female  DOB: [birth year]      PCP: [facility].    Center: ADMINISTRATIVE USE ONLY     Types of orders made on 11/14/2023: Outpatient Referral    Order Date:11/14/2023  Ordering Lynder Sanger [[id number]]  Encounter Provider:Goldrath, Liddie Reel., MD [phone]  Authorizing Provider: Leslie Rasp., MD [phone]  Department:OBGYN MDR [id number]]    Order Specific Information  Order: Referral to [facility] Pre-Anes Clinic Visit [Custom: [id number]]  Order #:          [id number] Qty: 1    Priority: Routine    Comment:Pregnant/ob anesthesia, history of antiphospholipid syndrome,             scoliosis    Associated Diagnoses      R76.0 Anti-cardiolipin antibody positive      Patient/Surgeon prefers: -> No preference         Surgeon/Referring provider: -> GOLDRATH, KATHRYN E.         Proposed surgery (CPT code or Procedure name): -> cesarean section         Preop diagnosis (ICD10 code): -> Antiphospholipid antibody positive         Anticipated date of surgery (or date range): -> 01/27/2024         Plan for anesthesia: -> Regional Anesthesia         Please select reason for referral (select all that apply): -> Other (Please                                                                  fill out                                                                  comments)         Procedure location: -> [facility]           Priority: Routine  Class: Normal    Comment:Pregnant/ob anesthesia, history of antiphospholipid syndrome,             scoliosis    Associated Diagnoses      R76.0 Anti-cardiolipin antibody positive      Patient/Surgeon prefers: -> No preference         Surgeon/Referring provider: -> GOLDRATH, KATHRYN E.         Proposed surgery (CPT code or Procedure name): -> cesarean section         Preop diagnosis (ICD10 code): -> Antiphospholipid antibody positive         Anticipated date of surgery (or date range): -> 01/27/2024         Plan for anesthesia: -> Regional Anesthesia         Please select reason for referral (select all that apply): -> Other (Please  fill out                                                                  comments)         Procedure location: -> [facility]

## 2023-11-17 ENCOUNTER — Telehealth: Payer: Self-pay

## 2023-11-17 NOTE — Telephone Encounter
 Call Back Request      Reason for call back: Pt states she'd like to transfer care from Dr.Goldrath to Dr.Stoddard, currently 30 weeks, please assist.     Any Symptoms:  []  Yes  [x]  No      If yes, what symptoms are you experiencing:    Duration of symptoms (how long):    Have you taken medication for symptoms (OTC or Rx):      If call was taken outside of clinic hours:    [] Patient or caller has been notified that this message was sent outside of normal clinic hours.     [] Patient or caller has been warm transferred to the physician's answering service. If applicable, patient or caller informed to please call us  back if symptoms progress.  Patient or caller has been notified of the turnaround time of 1-2 business day(s).

## 2023-11-22 NOTE — Telephone Encounter
 LVM:    (Pt already transferred her care from Dr. Jolly Needle to MDR in the beginning of pregnancy).     WW has availability with Dr. Kavi if pt is okay to see a female provider. (June 10th)

## 2023-11-23 NOTE — Telephone Encounter
 Per management ok to transfer [facility] from [facility] to [facility]   spoke with pt to scheduled sooner apt with Dr Kavi on 12/23/23  Offer appointment on 12/05/23 with Dr Merlyn Starring patient declined  Advise patient we can scheduled the rest or her ROB appointment on 12/23/23 pt has to see MD fist  And pt can schedule rest of ob at checkout.  Pt verbalized understood

## 2023-11-30 ENCOUNTER — Other Ambulatory Visit: Payer: BLUE CROSS/BLUE SHIELD

## 2023-12-01 DIAGNOSIS — M79661 Pain in right lower leg: Secondary | ICD-10-CM

## 2023-12-02 ENCOUNTER — Inpatient Hospital Stay: Payer: BLUE CROSS/BLUE SHIELD

## 2023-12-02 DIAGNOSIS — M79661 Pain in right lower leg: Secondary | ICD-10-CM

## 2023-12-05 ENCOUNTER — Ambulatory Visit: Payer: BLUE CROSS/BLUE SHIELD

## 2023-12-05 ENCOUNTER — Ambulatory Visit: Payer: BLUE CROSS/BLUE SHIELD | Attending: Student in an Organized Health Care Education/Training Program

## 2023-12-06 ENCOUNTER — Ambulatory Visit: Payer: Self-pay

## 2023-12-07 ENCOUNTER — Ambulatory Visit: Payer: Self-pay

## 2023-12-07 ENCOUNTER — Ambulatory Visit: Payer: BLUE CROSS/BLUE SHIELD

## 2023-12-07 ENCOUNTER — Telehealth: Payer: BLUE CROSS/BLUE SHIELD

## 2023-12-07 ENCOUNTER — Other Ambulatory Visit: Payer: BLUE CROSS/BLUE SHIELD

## 2023-12-07 DIAGNOSIS — O09523 Supervision of elderly multigravida, third trimester: Secondary | ICD-10-CM

## 2023-12-07 MED ORDER — VALACYCLOVIR HCL 500 MG PO TABS
500 mg | ORAL_TABLET | Freq: Two times a day (BID) | ORAL | 1 refills | 18.50000 days | Status: AC
Start: 2023-12-07 — End: ?

## 2023-12-07 NOTE — Telephone Encounter
 Spoke with Patient [name] [name] letting her know I am unable to schedule her future ROB appointments with Dr. Adolfo Ahr till she completes her 06/27 NOB/TOC appointment and I sincerely apologize because I did offer her dates and times for those appointments. Patient understood and asked to be contacted if a sooner appointment with Dr. Adolfo Ahr for NOB/TOC because she would like to call and make her NST appointments on those same dates

## 2023-12-07 NOTE — Telephone Encounter
 Call Back Request      Reason for call back: Patient saw MFM today and switched appts to Dr Kavi. She said they went over each appt and switching and none have updated even though she was told it would take a few minutes    Unsure if appts are on hold and I informed her it might be until she has her first appt with him (NOB end of month) but she said they discussed each appt and she can't schedule her NST testing until she has those dates  She is requesting a call or a mychart message of the appt changes so she can work around scheduling    Any Symptoms:  []  Yes  []  No      If yes, what symptoms are you experiencing:    Duration of symptoms (how long):    Have you taken medication for symptoms (OTC or Rx):      If call was taken outside of clinic hours:    [] Patient or caller has been notified that this message was sent outside of normal clinic hours.     [] Patient or caller has been warm transferred to the physician's answering service. If applicable, patient or caller informed to please call us  back if symptoms progress.  Patient or caller has been notified of the turnaround time of 1-2 business day(s).

## 2023-12-16 ENCOUNTER — Ambulatory Visit: Payer: Self-pay

## 2023-12-16 NOTE — Patient Instructions
 [facility] Discharge Instructions    Thank you for visiting the [facility] today. An anesthesiologist is a doctor who provides care to patients like you during surgery. During your visit today, your medical condition and medications have been carefully reviewed by one of our anesthesiologists. Your anesthesiologist asks that you follow the following instructions pertaining to your upcoming procedure:    Medications:    Please DO TAKE the following medications before your surgery or procedure:    1. All medications    You may use enough water to comfortably swallow any pills on the morning of surgery.    Please DO NOT TAKE the following medications before your surgery or procedure:    Medication:     Last Dose on:  N/a     Hydration:  It is also important to remain well hydrated before surgery. Please drink clear liquids up until 2 hours prior to your arrival time at [facility]. Clear liquids include any liquid you can see completely through: water, pedialyte, gatorade, black coffee, tea no milk.      Diagnostic Studies:  N/A    Consultations:  N/A    Smoking:  N/A    Nutrition:  N/A    Exercise:  It is important that you remain as active as possible prior to your procedure.     Other Instructions:  Please follow the ''Important Instructions Regarding Your Surgery'' that was given to you today.

## 2023-12-19 ENCOUNTER — Ambulatory Visit: Payer: Self-pay

## 2023-12-19 ENCOUNTER — Ambulatory Visit: Payer: BLUE CROSS/BLUE SHIELD | Attending: Student in an Organized Health Care Education/Training Program

## 2023-12-19 ENCOUNTER — Other Ambulatory Visit: Payer: BLUE CROSS/BLUE SHIELD

## 2023-12-21 ENCOUNTER — Other Ambulatory Visit: Payer: Self-pay

## 2023-12-21 MED ORDER — KETOCONAZOLE 2 % EX CREA
Freq: Two times a day (BID) | TOPICAL | 2 refills | 30.00000 days | Status: AC
Start: 2023-12-21 — End: ?

## 2023-12-22 NOTE — Telephone Encounter
 Message to Practice/Provider      Message: Paper work needs to be submitted by 06/27.    Return call is not being requested by the patient or caller.    Patient or caller has been notified that their message will be reviewed within 1-2 business days.

## 2023-12-23 ENCOUNTER — Other Ambulatory Visit: Payer: Self-pay

## 2023-12-23 ENCOUNTER — Ambulatory Visit: Payer: BLUE CROSS/BLUE SHIELD

## 2023-12-23 DIAGNOSIS — O0993 Supervision of high risk pregnancy, unspecified, third trimester: Principal | ICD-10-CM

## 2023-12-23 NOTE — Progress Notes
 [facility] NOTE    CC:  Return OB visit.     HPI: 38 y.o. G1P0 at [redacted]w[redacted]d here for routine OB visit.  Patient reports no unusual complaints.   Transfer of care from Dr. Berneice to [facility].    Reports active fetal movement.      ROS: denies bleeding, leaking of fluid, regular uterine contractions, or preeclampsia symptoms.      BP 99/67  ~ Pulse (!) 105  ~ Temp 36.8 ?C (98.3 ?F)  ~ Wt 142 lb 9.6 oz (64.7 kg)  ~ LMP 04/22/2023 (Exact Date)  ~ BMI 25.26 kg/m?  ~   ~      TWG = 20 lb 9.6 oz (9.344 kg)     PHYSICAL EXAM:    Vitals:    12/16/23 1104   BP: 99/67   Pulse: (!) 105   Temp: 36.8 ?C (98.3 ?F)   Weight: 142 lb 9.6 oz (64.7 kg)       Body mass index is 25.26 kg/m?SABRA    GENERAL: NAD, A&O x 4.    HEENT: No apparent trauma, no thyromegaly.    CV: Regular rate and rhythm.    RESP: Normal breathing rate. Normal respiratory effort.    ABDOMEN: Soft.  Gravid.  Non-tender fundus.  No other palpable masses. No hepatosplenomegaly.    EXT: No clubbing, cyanosis, edema bilaterally. Negative Homan's sign bilaterally    Fundal Height:  35  FHT: 150s  TAUS: Cephalic.  EFW today in clinic 5 lbs 8 oz.  Adequate AFI.  Active baby    Discussed labor process and when to call the hospital.     A/P       1)  Normally progressing pregnancy at [redacted]w[redacted]d.       - Fetal well being: overall reassuring, continue fetal kick counts    - Discussed OB triage and ER precautions.  Patient has after-hours contact information.  - Prenatal labs: reviewed  - Up-to-date with vaccines  - labor precautions and kick counts discussed.  - GBS next visit    2) APLS  - on low dose ASA until delivery  - if C/S, will have Lovenox 6 wk PP  - delivery by EDD  - NSTs starting 36 weeks- orders placed    3) SSA positive    4)  Mild scoliosis  - s/p PEP-C consult on 6/20    5)  HSV positive  - start Valtrex  at 36 weeks.  Rx in place and patient picked up medications      Return to clinic in 2 weeks       Electronically Signed by,    Ruel Heck, DO, JD, MPH, [facility]  Attending Physician, Obstetrics & Gynecology  [facility], Clinic [address]  12/23/2023 10:57 AM

## 2023-12-23 NOTE — Telephone Encounter
Disability has been completed.

## 2023-12-23 NOTE — Telephone Encounter
 Per patient transferring care to Dr. Kavi in [facility].     Patient est 12/23/23.

## 2023-12-28 ENCOUNTER — Inpatient Hospital Stay: Payer: Self-pay

## 2023-12-28 DIAGNOSIS — O0993 Supervision of high risk pregnancy, unspecified, third trimester: Secondary | ICD-10-CM

## 2023-12-28 DIAGNOSIS — O09513 Supervision of elderly primigravida, third trimester: Principal | ICD-10-CM

## 2023-12-28 NOTE — Procedures
 Fetal Diagnostics Unit  After Visit Summary Note    Patient: [name] [name]  MRN: [id number]  Date: 12/28/2023    Overview:     Date of Birth: 01/15/1986  Age:  38 y.o.  Estimated Delivery Date: 01/27/2024, by Last Menstrual Period  GA: [redacted]w[redacted]d    OB History       Gravida   1    Para        Term        Preterm        AB        Living             SAB        IAB        Ectopic        Multiple        Live Births                   Allergies: No Known Allergies  Prior to Admission Medications: (Not in a hospital admission)       General Visit Information:     Vitals:    12/28/23 0906   BP: 100/63   Pulse: 93   Resp: 18   Temp: 36.4 ?C (97.5 ?F)   TempSrc: Oral                    Assessment  Fetal movement: Yes  Pre-eclampsia symptoms : No  PTL symptoms: No  Labor symptoms: No       Fetal Diagnostics Testing Results:     NST Objective Findings    Nonstress Test  Variability: Moderate  Decelerations: Absent  Accelerations: Present  Baseline: 140 BPM  Uterine Irritability: No  Contractions: Not present                     (AFI) Amniotic Fluid Index     AFI  Multi Gestation?: No  RLQ: 4.69  RUQ: 8.52  LUQ: 3.13  LLQ: 4.33   Total Score (Calculated - cm): 20.67 cm  Fetal Presentation: Vertex/Cephalic  Placenta Location: Posterior Wall   (BPP) Biophysical Profile                                Interpretation:     Interpretation  NST Interpretation: Reactive                       Comments:  Covid screen negative. FKC, preeclampsia, and ptl precautions reviewed. Anteantal testing scheduled 7/11. Patient verbalizes understanding and all questions answered. CANDIE Cheese, RN.     Reviewed the NST results performed by RN.  Agree with above.    Electronically Signed by,    Ruel Heck, DO, JD, MPH, [facility]  Attending Physician, Obstetrics & Gynecology  [facility], Clinic [address]  12/29/2023 6:08 PM

## 2023-12-29 ENCOUNTER — Ambulatory Visit: Payer: Self-pay

## 2023-12-30 ENCOUNTER — Other Ambulatory Visit: Payer: BLUE CROSS/BLUE SHIELD

## 2024-01-02 ENCOUNTER — Ambulatory Visit: Payer: BLUE CROSS/BLUE SHIELD | Attending: Student in an Organized Health Care Education/Training Program

## 2024-01-02 ENCOUNTER — Other Ambulatory Visit: Payer: Self-pay

## 2024-01-03 ENCOUNTER — Ambulatory Visit: Payer: BLUE CROSS/BLUE SHIELD

## 2024-01-06 ENCOUNTER — Inpatient Hospital Stay: Payer: Self-pay

## 2024-01-06 ENCOUNTER — Ambulatory Visit: Payer: Self-pay

## 2024-01-06 DIAGNOSIS — O0993 Supervision of high risk pregnancy, unspecified, third trimester: Principal | ICD-10-CM

## 2024-01-06 NOTE — Progress Notes
 [facility] NOTE    CC:  Return OB visit.     HPI: 38 y.o. G1P0 at [redacted]w[redacted]d here for routine OB visit.  Patient reports no unusual complaints.     Reports active fetal movement.      ROS: denies bleeding, leaking of fluid, regular uterine contractions, or preeclampsia symptoms.      BP 98/66  ~ Pulse 97  ~ Wt 145 lb 6.4 oz (66 kg)  ~ LMP 04/22/2023 (Exact Date)  ~ BMI 25.76 kg/m?  ~   ~      TWG = 23 lb 6.4 oz (10.6 kg)     PHYSICAL EXAM:    Vitals:    01/06/24 0920   BP: 98/66   Pulse: 97   Weight: 145 lb 6.4 oz (66 kg)       Body mass index is 25.76 kg/m?[name] [name]    GENERAL: NAD, A&O x 4.    HEENT: No apparent trauma, no thyromegaly.    CV: Regular rate and rhythm.    RESP: Normal breathing rate. Normal respiratory effort.    ABDOMEN: Soft.  Gravid.  Non-tender fundus.  No other palpable masses. No hepatosplenomegaly.    EXT: No clubbing, cyanosis, edema bilaterally. Negative Homan's sign bilaterally    Fundal Height:  37  FHT: 150s    GBS Collected today in standard fashion with female chaperone present and after obtaining patient's verbal consent.      Discussed labor process and when to call the hospital.     A/P       1)  Normally progressing pregnancy at [redacted]w[redacted]d.       - Fetal well being: overall reassuring, continue fetal kick counts    - Discussed OB triage and ER precautions.  Patient has after-hours contact information.  - Prenatal labs: reviewed  - Up-to-date with vaccines  - labor precautions and kick counts discussed.  - GBS collected    2) APLS  - on low dose ASA until delivery  - if C/S, will have Lovenox 6 wk PP  - delivery by EDD  - NSTs starting 36 weeks- orders placed     3) SSA positive     4)  Mild scoliosis  - s/p PEP-C consult on 6/20     5)  HSV positive  - started Valtrex  at 36 weeks.  Rx in place and patient picked up medications       Return to clinic in 1 weeks       Electronically Signed by,    Ruel Heck, DO, JD, MPH, [facility]  Attending Physician, Obstetrics & Gynecology  [facility], Clinic [address]  01/06/2024 9:42 AM

## 2024-01-06 NOTE — Procedures
 Fetal Diagnostics Unit  After Visit Summary Note    Patient: [name] [name]  MRN: [id number]  Date: 01/06/2024    Overview:     Date of Birth: 1985/10/17  Age:  38 y.o.  Estimated Delivery Date: 01/27/2024, by Last Menstrual Period  GA: [redacted]w[redacted]d    OB History       Gravida   1    Para   0    Term   0    Preterm   0    AB   0    Living   0         SAB   0    IAB   0    Ectopic   0    Multiple   0    Live Births   0               Allergies: No Known Allergies  Prior to Admission Medications: (Not in a hospital admission)       General Visit Information:     Vitals:    01/06/24 1026   BP: 100/63   Pulse: 88   Resp: 20   Temp: 36 ?C (96.8 ?F)   TempSrc: Oral            Education  Teaching - Select all that apply: Kick counts, Fluids/hydration, Falls precautions, Pre-eclampsia precautions, PTL precautions, Other (specify)  Specify: pt watched video: Successful Breastfeeding Begins At [employer]       Assessment  Fetal movement: Yes  Comment: pt reports daily fetal movement  Pre-eclampsia symptoms : No  PTL symptoms: No  Labor symptoms: No       Fetal Diagnostics Testing Results:     NST Objective Findings    Nonstress Test  Variability: Moderate  Decelerations: Absent  Accelerations: Present  Acoustic Stimulator: No  Baseline: 150 BPM (change of baseline to 150 bpm with accelerations in both baselines)  Uterine Irritability: No  Contractions: Irregular  Frequency of Contractions: pt denies feeling, denies bleeding and SROM                     (AFI) Amniotic Fluid Index     AFI  Multi Gestation?: No  RLQ: 6.44  RUQ: 2.94  LUQ: 5.79  LLQ: 5.34   Total Score (Calculated - cm): 20.51 cm  Fetal Presentation: Vertex/Cephalic  Placenta Location: Posterior Wall   (BPP) Biophysical Profile                                Interpretation:     Interpretation  NST Interpretation: Reactive                       Comments:   Fetal kick counts, labor precautions and pre-eclampsia precautions reviewed, denies any signs and symptoms of Covid -19.        Norvel Joshua Galeazzi, RN          Reviewed the NST results performed by RN.  Agree with above.    Electronically Signed by,    Ruel Heck, DO, JD, MPH, [facility]  Attending Physician, Obstetrics & Gynecology  [facility], Clinic [address]  01/11/2024 6:20 PM

## 2024-01-08 LAB — BETA STREP B GENITAL PC: BETA STREP B GENITAL PCR: NOT DETECTED

## 2024-01-10 ENCOUNTER — Ambulatory Visit: Payer: Self-pay

## 2024-01-10 ENCOUNTER — Ambulatory Visit: Payer: BLUE CROSS/BLUE SHIELD | Attending: Student in an Organized Health Care Education/Training Program

## 2024-01-10 ENCOUNTER — Ambulatory Visit: Payer: BLUE CROSS/BLUE SHIELD

## 2024-01-10 DIAGNOSIS — D229 Melanocytic nevi, unspecified: Secondary | ICD-10-CM

## 2024-01-10 DIAGNOSIS — Z7189 Other specified counseling: Principal | ICD-10-CM

## 2024-01-10 DIAGNOSIS — L821 Other seborrheic keratosis: Principal | ICD-10-CM

## 2024-01-10 NOTE — Patient Instructions
 Welcome to [facility] [place]!    We look forward to being your comprehensive medical home for all your health care needs.     THE BASICS:  [email or web address]    [address]  [place], [place] [address]  Phone [phone]  Fax [phone]    Office Hours:  8:00 am to 5:00 pm, Monday to Friday    APPOINTMENTS:  Our office sees patients by appointment. Appointments can be made by calling the office during business hours or via MyChart or [email or web address]. You will encounter less hold time if you call in the late morning or the early afternoon.     We can usually accommodate requests for same day urgent visits, but please call early, after 8:00 am when the phone lines open. Same-day appointments may also be available on MyChart or [email or web address], but if you do not see any openings online, please send us  a message on MyChart or [email or web address] or just call. We also offer video visits if this is preferred, except for physicals where you are required to come in person.      We try our best to keep to our scheduled appointments. We also try to accommodate requests for same day sick visits. Occasionally, these two goals will conflict and there may be long waiting times, and we apologize in advance. In order for us  to try to keep to our schedules, we do ask that you please arrive on time for your appointment. If you are more than 15 minutes late, you may be asked to reschedule the appointment. If you need to cancel or reschedule an appointment, we ask that you call at least 24 hours before your scheduled appointment. Appointment no shows or late cancellations may result in a charge.    CHECKUP SCHEDULE:  The routine well child visits are at: 1-3 days after going home after birth, then at ages 2 weeks, followed by 1, 2, 4, 6, 9, 12, 15, and 18 months, 2 years, 2.5 years, 3 years, and a yearly well visit thereafter. Flu vaccines are recommended starting at 82 months of age in early fall - usually early September. We follow the standard recommended immunization schedule, and discourage declining or significantly modifying childhood immunizations. If you have concerns or questions, we are happy to take time to discuss and provide you with resources for your own research.     PRACTICE PHILOSOPHY  We here at [facility] [place] believe in a preventative and evidence-based approach to medicine. We love having our children in for their checkups because encouraging healthy living is the key to long-term wellness. That also means that many checkups do come with vaccinations. In the reality of children's illnesses - from sniffles and fevers to more long-term health issues - we try our best to follow the facts and guidelines that science has laid out for us . This means that not every cough needs antibiotics, as we strive overall to minimize exposure to medications unless truly indicated. Lastly, we believe that a strong, mutually trusting relationship between a pediatrician and a family is crucial.     CONTACTING YOUR PEDIATRICIAN:  - Many common questions that parents have can be answered online from reputable sources. We recommend www.healthychildren.org - the official [employer] website for parents that covers topics at every age.   - For questions that can be answered by the front office staff, or for questions regarding billing or referrals, please message or call the  office during regular office hours. For pharmacy refills, please ask the pharmacy to fax the refill request to us  or send us  the refill request electronically.  - For questions that are not urgent, it is best to send the question via MyChart or [email or web address], or call the office and leave a message for your pediatrician. You should expect a call back within the next business day, but depending on the extent of the consult, you may be billed for a telephone or e-visit.  - After hours, you can call our office [phone]) where we have a pediatric-trained advice nurse available on standby to answer questions that cannot wait until the office opens. If necessary, this nurse may page the on-call [facility] pediatrician and he or she will call you back as soon as possible - typically within 30 minutes.    AFTER HOURS CARE:  [facility] maintains a wide network of clinics throughout greater [place] that are open during the evenings and weekends. We recommend the [place] [facility] ([address], couple blocks from our office), [facility] [address] or [facility] Immediate Care offices, or the [facility] pediatric urgent clinic. Hours can vary, so check [email or web address] for up-to-date hours and contact information. Please follow up at our office after any visit to urgent care centers within 2-3 days.    EMERGENCY CARE:  We recommend the [facility] Emergency Department or the [facility] Emergency Department if you should need to take your child to an emergency room. If your child has a life threatening illness, call 911 and the paramedics will automatically take your child to the nearest emergency department. Please follow up at our office after any visit to any ER on the next day our office is open.    HOSPITALIZATIONS:  In the event that your child requires hospitalization from one of the [place] ERs, your child will be hospitalized at either the [facility] or the [facility] in [place]. Please reach out to our office in the event that your child is hospitalized and keep us  updated. We request you bring your child for follow up in 2-3 days after discharge. We also have the same medical system as the hospital and will have access to all of their medical chart, including any labs or imaging.     REFERRALS:  We have a comprehensive group of specialists at [facility] clinics in [place], [place], and [place] who can help care for your child should he/she need it. For patients who have managed care insurance, a referral must be authorized. Please speak with us  to see which, if any, specialist is needed. Once a referral is submitted, you should be able to call for an appointment within a few days unless it is a complicated referral and needs special consideration or review. For patients who have non-managed care insurance, a formal referral process is not needed; however, please speak with us  regarding which, if any, specialist should be consulted.    OTHER IMPORTANT NUMBERS:  - Laboratories: [email or web address].info/ (Aside from these lab locations, we can also do some blood draws in our office during regular hours)   - Radiology: [email or web address]   - [place] Managed Care Office: [phone]  - [place] Billing Office: [phone] or [phone]     --------    Good news! I just verified and CAH is one of the genetic disorders tested in the newborn metabolic screen!    [email or web address]    Here  is a great resource on childhood vaccines:    Concerns related to the safety of vaccines vary. [facility]'s website is a great resource for you that addresses many of these concerns so you can make an informed decision for the health and safety of your child because that's what we want too!    https://www.ToysManual.co.za    https://www.healthychildren.org/English/safety-prevention/immunizations/Pages/how-vaccines-are-developed-tested-for-safety-and-approved-step-by-step.aspx

## 2024-01-10 NOTE — Progress Notes
 Dermatology Attending Note    PATIENT: [name] [name]  MRN: [id number]  DOB: [birth year]  DATE OF SERVICE: 01/10/2024    CHIEF COMPLAINT:   No chief complaint on file.      Subjective:     [name] [name] is a 37 y.o. female presenting on a video visit for skin concerns.     Patient is here for evaluation of concerning areas on skin and a total body skin exam.     Notes freckles on skin developed during pregnancy.    Hx of positive SSA, B2-glycoprotein and anti-cardiolipin   Patient is [redacted] weeks pregnant today  PMH: No personal hx of skin cancer    No outpatient medications have been marked as taking for the 01/10/24 encounter (Appointment) with Dorthey Bakes, MD, PhD.     No Known Allergies    Objective:     (Offered chaperone but patient declined.)   left breast, left abdomen, left thigh with waxy brown plaques  Face, chest and back with scattered 2-60mm brown macules and papules with uniform color, symmetry, regular borders and regular pigment network under dermoscopy    Assessment/Plan     #Seborrheic keratosis  - left breast, left abdomen, left thigh  - benign, reassured  - consider treating with LN2    - recommend to monitor the lesion on left breast for any changes.       # Benign nevus  - face, chest and back    -------------------------------------------------------------------------------------------------------------------------  The above diagnosis and treatment options were reviewed with the patient.  We recommended sunscreen and sun-protective clothing on a daily basis.  The patient was informed of the major side effects associated with the above medication(s) and recommended to read the package inserts. Education was provided on the correct use of the medications and over the counter products, and the need to use these on a regular basis in order to achieve the best results.         RTC 3 months , or as needed sooner for any new, changing, evolving, or concerning lesions or symptoms.        Bakes Dorthey, MD, PhD 01/10/2024 12:51 PM

## 2024-01-12 ENCOUNTER — Inpatient Hospital Stay: Payer: BLUE CROSS/BLUE SHIELD

## 2024-01-12 ENCOUNTER — Telehealth: Payer: BLUE CROSS/BLUE SHIELD

## 2024-01-12 ENCOUNTER — Ambulatory Visit: Payer: Self-pay

## 2024-01-12 ENCOUNTER — Other Ambulatory Visit: Payer: Self-pay

## 2024-01-12 DIAGNOSIS — O0993 Supervision of high risk pregnancy, unspecified, third trimester: Principal | ICD-10-CM

## 2024-01-12 NOTE — Telephone Encounter
-----   Message from Ruel Heck, [place], MPH sent at 01/12/2024 10:14 AM PDT -----  Regarding: induction  MRN/ Patient Name: [id number] [facility]  Delivery Location: [facility]  Requested Date: 01/21/2024 Requested Time: 8p  GA at the time of the appointment: 39 2/7 weeks  EDD: 01/27/2024  Type of Procedure: Medical IOL  Reason for the Procedure: LGA  Neonatal complications if applicable: n/a  Gravida/ Para: 1/0    Provider Performing the Procedure: AOD    Would you like an Outlook invite sent for your C/S: No    If applicable please provide an acceptable date range for the procedure, and we can coordinate with the patient:    Date Range:   If the 7/27 opens, please reschedule to 7/27 anytime in PM.        Thanks,    Electronically Signed by,    Ruel Heck, DO, JD, MPH, FACOG  Attending Physician, Obstetrics & Gynecology  [facility], Clinic [address]  01/12/2024 10:14 AM

## 2024-01-12 NOTE — Progress Notes
 [facility] NOTE    CC:  Return OB visit.     HPI: 38 y.o. G1P0000 at [redacted]w[redacted]d here for routine OB visit.  Patient reports no unusual complaints.     Reports active fetal movement.      ROS: denies bleeding, leaking of fluid, regular uterine contractions, or preeclampsia symptoms.      BP 101/58  ~ Pulse 88  ~ Wt 146 lb (66.2 kg)  ~ LMP 04/22/2023 (Exact Date)  ~ BMI 25.86 kg/m?  ~   ~      TWG = 24 lb (10.9 kg)     PHYSICAL EXAM:    Vitals:    01/12/24 0937   BP: 101/58   Pulse: 88   Weight: 146 lb (66.2 kg)       Body mass index is 25.86 kg/m?SABRA    GENERAL: NAD, A&O x 4.    HEENT: No apparent trauma, no thyromegaly.    CV: Regular rate and rhythm.    RESP: Normal breathing rate. Normal respiratory effort.    ABDOMEN: Soft.  Gravid.  Non-tender fundus.  No other palpable masses. No hepatosplenomegaly.    EXT: No clubbing, cyanosis, edema bilaterally. Negative Homan's sign bilaterally    Fundal Height:  38  FHT: 150s    Discussed labor process and when to call the hospital.     A/P       1)  Normally progressing pregnancy at [redacted]w[redacted]d.       - Fetal well being: overall reassuring, continue fetal kick counts    - Discussed OB triage and ER precautions.  Patient has after-hours contact information.  - Prenatal labs: reviewed  - Up-to-date with vaccines  - labor precautions and kick counts discussed.  - GBS neg    2) Growth sono  - consider induction at 39 weeks    Return to clinic in 1 weeks       Electronically Signed by,    Ruel Heck, DO, JD, MPH, FACOG  Attending Physician, Obstetrics & Gynecology  [facility], Clinic [address]  01/12/2024 10:02 AM

## 2024-01-12 NOTE — Procedures
 Fetal Diagnostics Unit  After Visit Summary Note    Patient: [name] [name]  MRN: [id number]  Date: 01/12/2024    Overview:     Date of Birth: [birth year]  Age:  38 y.o.  Estimated Delivery Date: 01/27/2024, by Last Menstrual Period  GA: [redacted]w[redacted]d    OB History       Gravida   1    Para   0    Term   0    Preterm   0    AB   0    Living   0         SAB   0    IAB   0    Ectopic   0    Multiple   0    Live Births   0               Allergies: No Known Allergies  Prior to Admission Medications: (Not in a hospital admission)       General Visit Information:     Vitals:    01/12/24 0836   BP: 101/58   Pulse: 88            Education  Teaching - Select all that apply: Kick counts, Fluids/hydration, Pre-eclampsia precautions, Labor precautions       Assessment  Fetal movement: Yes  Comment: Patient reports normal fetal movement daily  Pre-eclampsia symptoms : No  Labor symptoms: No       Fetal Diagnostics Testing Results:     NST Objective Findings    Nonstress Test  Variability: Moderate  Decelerations: Absent  Accelerations: Present  Acoustic Stimulator: No  Baseline: 145 BPM  Uterine Irritability: Yes  Contractions: Irregular  Frequency of Contractions: x1                     (AFI) Amniotic Fluid Index     AFI  Multi Gestation?: No  RLQ: 1.25  RUQ: 6.41  LUQ: 12.27  LLQ: 7.58   Total Score (Calculated - cm): 27.51 cm  Fetal Presentation: Vertex/Cephalic  Placenta Location: Posterior Wall   (BPP) Biophysical Profile                                Interpretation:     Interpretation  NST Interpretation: Reactive                       Comments: Patient verbalized understanding of education topics listed above. Endorses normal fetal movement daily; denies regular painful contractions, vaginal bleeding, LOF, signs/symptoms of preeclampsia, and s/s of COVID-19. Patient understands follow up appointment date and time. All questions and concerns addressed.      CHARLENA Fireman, RN    Reviewed the NST results performed by RN.  Agree with above.    Electronically Signed by,    Ruel Heck, DO, JD, MPH, [facility]  Attending Physician, Obstetrics & Gynecology  [facility], Clinic [address]  01/12/2024 5:54 PM

## 2024-01-12 NOTE — Telephone Encounter
 Scheduled induction. Confirmation sent via portal

## 2024-01-16 ENCOUNTER — Ambulatory Visit: Payer: BLUE CROSS/BLUE SHIELD | Attending: Student in an Organized Health Care Education/Training Program

## 2024-01-18 ENCOUNTER — Ambulatory Visit: Payer: Self-pay

## 2024-01-18 ENCOUNTER — Other Ambulatory Visit: Payer: Self-pay

## 2024-01-18 ENCOUNTER — Telehealth: Payer: Self-pay

## 2024-01-18 ENCOUNTER — Inpatient Hospital Stay: Payer: BLUE CROSS/BLUE SHIELD

## 2024-01-18 ENCOUNTER — Other Ambulatory Visit: Payer: BLUE CROSS/BLUE SHIELD

## 2024-01-18 DIAGNOSIS — O0993 Supervision of high risk pregnancy, unspecified, third trimester: Secondary | ICD-10-CM

## 2024-01-18 NOTE — Procedures
 Fetal Diagnostics Unit  After Visit Summary Note    Patient: [name] [name]  MRN: [id number]  Date: 01/18/2024    Overview:     Date of Birth: 1986/01/10  Age:  38 y.o.  Estimated Delivery Date: 01/27/2024, by Last Menstrual Period  GA: [redacted]w[redacted]d    OB History       Gravida   1    Para   0    Term   0    Preterm   0    AB   0    Living   0         SAB   0    IAB   0    Ectopic   0    Multiple   0    Live Births   0               Allergies: No Known Allergies  Prior to Admission Medications: (Not in a hospital admission)       General Visit Information:     Vitals:    01/18/24 0817   BP: 103/66   Pulse: 86            Education  Teaching - Select all that apply: Kick counts, Fluids/hydration, Pre-eclampsia precautions, Labor precautions       Assessment  Fetal movement: Yes  Comment: Patient reports normal fetal movement daily  Pre-eclampsia symptoms : No  Labor symptoms: No       Fetal Diagnostics Testing Results:     NST Objective Findings    Nonstress Test  Variability: Moderate  Decelerations: Absent  Accelerations: Present  Acoustic Stimulator: No  Baseline: 140 BPM  Uterine Irritability: No  Contractions: Not present                     (AFI) Amniotic Fluid Index     AFI  Multi Gestation?: No  RLQ: 5.16  RUQ: 8.31  LUQ: 3.31  LLQ: 5.99   Total Score (Calculated - cm): 22.77 cm  Fetal Presentation: Vertex/Cephalic  Placenta Location: Posterior Wall   (BPP) Biophysical Profile                                Interpretation:     Interpretation  NST Interpretation: Reactive  Comments: NST located in Media Tab due to Obix malfunction                       Comments: Patient verbalized understanding of education topics listed above. Endorses normal fetal movement daily; denies regular painful contractions, vaginal bleeding, LOF, signs/symptoms of preeclampsia, and s/s of COVID-19. Patient understands follow up appointment date and time. All questions and concerns addressed.    CHARLENA Fireman, RN    Reviewed the NST results performed by RN.  Agree with above.    Electronically Signed by,    Ruel Heck, DO, JD, MPH, [facility]  Attending Physician, Obstetrics & Gynecology  [facility], Clinic [address]  01/19/2024 4:44 PM

## 2024-01-18 NOTE — Telephone Encounter
 Scheduled NST. Confirmation sent via portal

## 2024-01-18 NOTE — Telephone Encounter
-----   Message from Cheryle Celine Nap sent at 01/18/2024 10:51 AM PDT -----  Regarding: FW: scheduling    ----- Message -----  From: Texin, Erika Marie  Sent: 01/18/2024  10:32 AM PDT  To: Cheryle Celine Nap  Subject: scheduling                                       Hi [name] [name]! The dr's need this patient to get an AFI/NST over the weekend due to them pushing back her induction date. We are closed on weekends, patient states she will arrive to L&D to get AFI/NST done on Saturday 01/21/24 around 11-12noon.   Can you please add her to snapboard? Thank you!!

## 2024-01-18 NOTE — Progress Notes
 [facility] NOTE    CC:  Return OB visit.     HPI: 38 y.o. G1P0000 at [redacted]w[redacted]d here for routine OB visit.  Patient reports no unusual complaints.     Reports active fetal movement.      ROS: denies bleeding, leaking of fluid, regular uterine contractions, or preeclampsia symptoms.      BP 103/66  ~ Pulse 86  ~ Wt 146 lb 9.6 oz (66.5 kg)  ~ LMP 04/22/2023 (Exact Date)  ~ BMI 25.97 kg/m?  ~   ~      TWG = 24 lb 9.6 oz (11.2 kg)     PHYSICAL EXAM:    Vitals:    01/18/24 0927   BP: 103/66   Pulse: 86   Weight: 146 lb 9.6 oz (66.5 kg)       Body mass index is 25.97 kg/m?SABRA    GENERAL: NAD, A&O x 4.    HEENT: No apparent trauma, no thyromegaly.    CV: Regular rate and rhythm.    RESP: Normal breathing rate. Normal respiratory effort.    ABDOMEN: Soft.  Gravid.  Non-tender fundus.  No other palpable masses. No hepatosplenomegaly.    EXT: No clubbing, cyanosis, edema bilaterally. Negative Homan's sign bilaterally    Fundal Height:  38  FHT: 150s    Discussed labor process and when to call the hospital.     A/P       1)  Normally progressing pregnancy at [redacted]w[redacted]d.       - Fetal well being: overall reassuring, continue fetal kick counts    - Discussed OB triage and ER precautions.  Patient has after-hours contact information.  - Prenatal labs: reviewed  - Up-to-date with vaccines  - labor precautions and kick counts discussed.     2) APLS  - on low dose ASA until delivery  - if C/S, will have Lovenox 6 wk PP  - delivery by EDD  - NSTs starting 36 weeks- orders placed     3) SSA positive     4)  Mild scoliosis  - s/p PEP-C consult on 6/20     5)  HSV positive  - started Valtrex  at 36 weeks.  Rx in place and patient picked up medications       Scheduled induction on 01/21/2024    Return to clinic in 1 weeks       Electronically Signed by,    Ruel Heck, DO, JD, MPH, [facility]  Attending Physician, Obstetrics & Gynecology  [facility], Clinic [address]  01/18/2024 9:37 AM

## 2024-01-21 ENCOUNTER — Ambulatory Visit: Payer: BLUE CROSS/BLUE SHIELD

## 2024-01-21 ENCOUNTER — Inpatient Hospital Stay
Admit: 2024-01-21 | Discharge: 2024-01-21 | Disposition: A | Discharge status: Home / Self Care | Payer: BLUE CROSS/BLUE SHIELD

## 2024-01-21 NOTE — Progress Notes
 [place] Department of Obstetrics and Gynecology  OB Triage Note    [name] [name] MRN: [id number]   Age: 38 y.o.   DOB: 05-01-1986 Admit Date:      Gestational Age: [redacted]w[redacted]d      Subjective:   [name] [name] is a 38 y.o. G1P0000 at [redacted]w[redacted]d (EDD: 01/27/2024, by Last Menstrual Period) who presents to Labor and Delivery for NST.    Patient of Dr. Kavi.     Pregnancy complicated by:  1. Antiphospholipid antibody positive  2. SSA positive  3. Mild scoliosis  4. HSV positive    Pt reports feeling overall well .     Fetal movement: yes  Contractions: no  Vaginal bleeding:no  Leakage of fluid: no    Preeclampsia Symptoms: denies headache, vision changes, or RUQ pain.  Denies dysuria, unusual discharge.    OB History   Gravida Para Term Preterm AB Living   1 0 0 0 0 0   SAB IAB Ectopic Multiple Live Births   0 0 0 0 0      # Outcome Date GA Lbr Len/2nd Weight Sex Type Anes PTL Lv   1 Current                Objective:   Vital signs:   Vitals:    01/21/24 1115   BP: 100/58   Pulse: 80   Resp: 18   Temp: 36.8 ?C (98.2 ?F)   TempSrc: Axillary   SpO2: 98%       PE:   General: NAD  Pulmonary: normal work of breathing on room air  Abdomen: soft, gravid, nontender  Extremities: symmetric, nontender    Cervical Exam:   deferred    Fetal Heart Tracing(s):  Baseline: 135 BPM  Variability: moderate  Accelerations: present  Decels: none  Toco: quiet    Labs:  No results for input(s): ''WBC'', ''HGB'', ''HCT'', ''MCV'', ''PLT'', ''CREAT'', ''AST'', ''ALT'' in the last 72 hours.  UPC:   Lab Results   Component Value Date    TPCREAT  08/18/2023      Comment:      Unable to calculate. Test result is below detection limit.       Bedside Ultrasound:   Presentation: Cephalic  Placenta: Posterior  AFI: 17.09 cm    Assessment:   [name] [name] is a 38 y.o. G1P0000 at [redacted]w[redacted]d (EDD: 01/27/2024, by Last Menstrual Period) who presents to Labor and Delivery for NST.    Plan/ Recommendation:     # APLS  - on low dose ASA until delivery  - if C/S, will have Lovenox 6 wk PP  - delivery by EDD # SSA positive     # Mild scoliosis  - s/p PEP-C consult on 6/20     # HSV positive  - started Valtrex  at 36 weeks    #FWB: reassuring modified BPP    #Follow up: Scheduled induction on 01/25/2024    Leandro Collet, MD, PGY 1 01/21/2024  OB pagers: Intern [phone], PGY2 [phone], Chief [phone]  DW Verdia HAAS Jay Mays, MD    Attending note:  I have reviewed the notes, assessments, and procedures performed. I concur with the documentation of [facility]    Mays Jay, MD

## 2024-01-22 DIAGNOSIS — Z3A39 39 weeks gestation of pregnancy: Secondary | ICD-10-CM

## 2024-01-23 ENCOUNTER — Ambulatory Visit: Payer: BLUE CROSS/BLUE SHIELD | Attending: Student in an Organized Health Care Education/Training Program

## 2024-01-23 ENCOUNTER — Ambulatory Visit: Payer: Self-pay

## 2024-01-24 NOTE — Progress Notes
 OB OUTPATIENT PROGRESS NOTE    Age: 38 y.o.  G1P0000  LMP: Patient's last menstrual period was 04/22/2023 (exact date).  EDC: Estimated Date of Delivery: 01/27/24  EGA: [redacted]w[redacted]d    Preg c/b  Patient Active Problem List   Diagnosis    Raynaud phenomenon    Globus sensation    Heat intolerance    Lumbar disc disease with radiculopathy    Hyperlipidemia    Herpes genitalis    Paresthesia of arm    Anti-cardiolipin antibody positive    Chronic constipation    External hemorrhoid    Supervision of high-risk pregnancy of elderly primigravida    Antiphospholipid antibody positive    Raised antibody titer    Testing for genetic disease carrier status    Iron deficiency anemia     CC: routine prenatal care    Subjective   ***  Denies abdominal/pelvic pain, bleeding per vagina, dysuria.  Denies contractions, blood/fluid per vagina, preeclampsia symptoms, dysuria. Reports normal fetal movement.    Objective   VS:   LMP 04/22/2023 (Exact Date)   24 lb 9.6 oz (11.2 kg)    General: NAD  FH: appropriate  FHR: normal bpm     Assessment & Plan     [name] [name] is a 38 y.o. G1P0000 at [redacted]w[redacted]d.      No orders of the defined types were placed in this encounter.    Future Appointments   Date Time Provider Department Center   01/25/2024 10:00 PM RR SCHEDULED APPOINTMENTS 5DR None   01/26/2024  9:15 AM Jama Toribio RAMAN., MD OBG [address] [place]/Cen   01/26/2024 11:15 AM RR 5FDU (FDU) 5FDU [place]/Cen   03/08/2024  3:15 PM Veda Bi, DO, MPH OBG [address] [place]/Cen   04/11/2024  1:45 PM Dorthey Bakes, MD, PhD DERM [facility]     Toribio CANDIE Jama, MD

## 2024-01-25 ENCOUNTER — Ambulatory Visit: Payer: Self-pay

## 2024-01-25 ENCOUNTER — Inpatient Hospital Stay
Admit: 2024-01-25 | Discharge: 2024-01-28 | Disposition: A | Discharge status: Home / Self Care | Payer: BLUE CROSS/BLUE SHIELD | Source: Home / Self Care | Attending: Student in an Organized Health Care Education/Training Program

## 2024-01-25 ENCOUNTER — Other Ambulatory Visit: Payer: Self-pay

## 2024-01-25 NOTE — H&P
 [name] [name] Department of Obstetrics and Gynecology  Obstetrical History and Physical  [address]  [place], [place] [address]  [phone]    [name] [name] MRN: [id number]   Age: 38 y.o.   DOB: 04-19-1986 Admit Date: 01/25/2024     Current Gestational Age: [redacted]w[redacted]d Estimated Date of Delivery: 01/27/24     CHIEF COMPLAINT: r/o SROM  REFERRING MD: Patient of Dr. Kavi.  History of Present Illness:   [name] [name] is a 38 y.o. G1P0000 at [redacted]w[redacted]d (EDD: 01/27/2024, by Last Menstrual Period), presenting for r/o SROM.    Pregnancy complicated by:  1. AMA  2. IDA  3. H/o genital herpes  4. Anti-cardiolipin antibody positive  5. Lumbar disc herniation    Pt reports having some clear fluid that soaked through her liner around noon today. Had some mucus discharge that came but denies constant leaking.      Fetal movement: yes  Contractions: no  Vaginal bleeding:no  Leakage of fluid: yes    Preeclampsia symptoms: denies headache, vision changes, or RUQ pain.  Denies dysuria, unusual discharge.    Review of Systems:  Negative for 14 point ROS except as per HPI.    OB HISTORY:  OB History   Gravida Para Term Preterm AB Living   1 0 0 0 0 0   SAB IAB Ectopic Multiple Live Births   0 0 0 0 0      # Outcome Date GA Lbr Len/2nd Weight Sex Type Anes PTL Lv   1 Current                GYN HISTORY:  Patient's last menstrual period was 04/22/2023 (exact date).    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 No longer present    Seasonal allergies     Sexually transmitted infection     Herpes       PAST SURGICAL HISTORY:  Past Surgical History:   Procedure Laterality Date    WISDOM TOOTH EXTRACTION         MEDICATIONS:  Prior to Admission medications    Medication MD Sig Start Date End Date Taking? Authorizing Provider   ferrous sulfate  325 (65 FE) mg EC tablet 325 mg, Oral, Every other day 10/27/23   Berneice Lamarr BRAVO., MD   Prenatal Vit-Fe Fumarate-FA (PRENATAL VITAMIN) tablet 1 tablet, Daily    PROVIDER, HISTORICAL   valACYclovir  500 mg tablet 500 mg, Oral, 2 times daily 12/07/23   Missy Tawni RAMAN., MD       ALLERGIES:  No Known Allergies     FAMILY HISTORY:  Family History   Problem Relation Age of Onset    Skin cancer Paternal Grandfather     Stroke Paternal Grandfather     Stroke Paternal Grandmother     Arthritis Paternal Grandmother     Breast cancer Maternal Grandmother 42    Gout Maternal Grandfather     Scleroderma Father     Arthritis Father     Lupus Mother     Arthritis Mother     Breast cancer Maternal Great-Grandmother     Colon cancer Neg Hx     Endometrial cancer Neg Hx     Ovarian cancer Neg Hx  SOCIAL HISTORY:   reports that she has never smoked. She has never used smokeless tobacco. She reports that she does not currently use alcohol after a past usage of about 0.6 oz of alcohol per week. She reports that she does not use drugs.    Objective:     Vitals:    01/25/24 1404   BP: 116/71   Temp: 36.8 ?C (98.2 ?F)   TempSrc: Oral   Weight: 66.7 kg (147 lb)   Height: 1.6 m (5' 3'')       Physical Exam:  Body mass index is 26.04 kg/m?.  Gen: NAD   Abd: gravid, non-tender  Ext: no calf tenderness, trace symmetric edema    SSE:  Normal external genitalia. Normal vaginal walls with grossly closed cervix visualized with discharge. No significant pooling. Ferning/nitrazine negative. No active herpes lesions.     Fetal Heart Tracing(s):  Baseline 130 BPM  Variability moderate  Accelerations present  Decels none  Toco: q70min that patient doesn't feel    [name] [name]: pending    Exam Type: Transabdominal OB >14 weeks      Findings:    Number of fetuses: 1  Placental location: post  Presentation: Cephalic  Cardiac activity: Present    DVP: 3.8cm    Prenatal Care  Ultrasounds   Dating: LMP  Anatomy (18-22 wk): normal  Growth:       Genetic screening: low risk    GTT: normal    GBS:   Results for orders placed or performed in visit on 01/06/24   Genital Beta Strep PCR, Vaginal/Rectum    Specimen: Vaginal/Rectal; Genital Swab   Result Value Ref Range    Beta Strep B Genital PCR Not Detected Not Detected       Assessment:   [name] [name] is a 38 y.o. G1P0000 at 107w5d (EDD: 01/27/2024, by Last Menstrual Period) who presented for r/o SROM but stayed d/t eIOL scheduled later today.    Plan:   # R/o SROM  # Labor:   - SSE w/o significant pooling, nitrazine/ferning neg, but given pt's scheduled for IOL later tonight, pt decided to stay for IOL  - Admit to L&D, consents signed, routine labs drawn  - Plan for IOL with buccal misoprostol  and SVE in 4 hours  - Pt would like to eat before starting IVs or the induction process, so will start after pt eats  - SSE w/o active lesions   - pt would prefer to have an experienced phlebotomist or anesthesiologist to get IV access and epidural placed  - counseled patient about shoulder precautions and patient understands and would like to proceed with vaginal delivery. Shoulder dystocia is an obstetric emergency due to risk of fetal hypoxia. There will be extra nurses and MD during delivery to help with assistance. Counseled patient on the maneuvers used during shoulder dystocia. There is a risk of  permenant fetal brachial plexus injury including erbs palsy with shoulder dystocia. Patient understands risk and desires to proceed with trial of labor with goal of SVD    # H/o genital herpes  - on valtrex  for suppression  - no active lesions on SSE (01/25/24)    # Anti-cardiolipin IgA antibody, beta-2 glycoprotein IgM pos  # SSA antibody   - on bASA throughout pregnancy  - consider lovenox x6wks if CS    # Iron deficiency anemia  - on Fe EOD    # Lumbar disc herniation  # Mild scoliosis  - s/p PEP-C consult (6/20)  -  has chronic back pain and has difficulty laying flat for prolonged periods of time  - patient would like to try multiple positions during labor but does not think she'll have trouble in dorsal lithotomy     # Baby: EFW 3500g ([place]), cephalic  - FWB: Category 1, intermittent  # GBS: negative (01/06/24)  # Pain: desires epidural   # Heme: pending    # PNL: B Positive / RMVI / QFG neg    # BF: yes  # ppBCM: f/u PP  # Vaccines: s/p tdap     # VTE ppx: low risk - ambulation  # COVID: asx    # Delivery: anticipate NSVD  # Hemorrhage risk factors:   - Low risk: no previous uterine incision, singleton pregnancy, <4 vaginal births, no known bleeding disorder, and no history of PPH  - Medium risk: none  - High risk: none  Patient is overall: Low risk.    Jordan G. Lupe, MD, PGY 1 01/25/2024  OB pagers: Intern [phone], PGY2 [phone], Chief [phone]  Discussed with Izella MD  Discussed with Rumalda Larraine LABOR., MD    I have seen and examined [name] [name]. I discussed the patient with the resident team and agree with the findings and plan as documented.    Shannel Zahm A. [facility]  Obstetrics & Gynecology

## 2024-01-26 ENCOUNTER — Ambulatory Visit: Payer: BLUE CROSS/BLUE SHIELD | Attending: Student in an Organized Health Care Education/Training Program

## 2024-01-26 ENCOUNTER — Ambulatory Visit: Payer: BLUE CROSS/BLUE SHIELD

## 2024-01-26 LAB — CBC: MCH CONCENTRATION: 32.9 g/dL (ref 31.5–35.5)

## 2024-01-26 LAB — RPR, Prenatal: RPR, PRENATAL: NONREACTIVE

## 2024-01-26 MED ADMIN — DEXTROSE 5 %/LACTATED RINGERS: 125 mL/h | INTRAVENOUS | @ 19:00:00 | Stop: 2024-01-26 | NDC 00338012504

## 2024-01-26 MED ADMIN — LIDOCAINE HCL (PF) 1 % IJ SOLN: 30 mL | INTRADERMAL | @ 22:00:00 | Stop: 2024-01-26 | NDC 00409427902

## 2024-01-26 MED ADMIN — BUPIVACAINE HCL (PF) 0.25 % IJ SOLN: INTRATHECAL | @ 11:00:00 | Stop: 2024-01-26 | NDC 63323046417

## 2024-01-26 MED ADMIN — PRENATAL VITAMIN TABS (MULTI-GPI): 1 | ORAL | @ 18:00:00 | Stop: 2024-01-26 | NDC 77333071525

## 2024-01-26 MED ADMIN — OXYTOCIN 30 UNITS/500 ML NS RTU: .001.02 [IU]/min | INTRAVENOUS | @ 14:00:00 | Stop: 2024-01-26 | NDC 71266510301

## 2024-01-26 MED ADMIN — MISOPROSTOL 25 MCG PO TABS: 50 ug | BUCCAL | @ 02:00:00 | Stop: 2024-01-26

## 2024-01-26 MED ADMIN — WITCH HAZEL-GLYCERIN EX PADS: TOPICAL | Stop: 2024-01-29 | NDC 50289325001

## 2024-01-26 MED ADMIN — OXYTOCIN 30 UNITS/500 ML NS RTU: 3 [IU]/h | INTRAVENOUS | @ 23:00:00 | Stop: 2024-01-27 | NDC 71266510301

## 2024-01-26 MED ADMIN — VALACYCLOVIR HCL 500 MG PO TABS: 500 mg | ORAL | @ 06:00:00 | Stop: 2024-01-26 | NDC 68084021511

## 2024-01-26 MED ADMIN — MINERAL OIL PO OIL: 30 mL | TOPICAL | @ 22:00:00 | Stop: 2024-01-26 | NDC 48433020230

## 2024-01-26 MED ADMIN — VALACYCLOVIR HCL 500 MG PO TABS: 500 mg | ORAL | @ 18:00:00 | Stop: 2024-01-26 | NDC 68084021511

## 2024-01-26 MED ADMIN — ACETAMINOPHEN 325 MG PO TABS: 650 mg | ORAL | Stop: 2024-01-29 | NDC 00904677361

## 2024-01-26 MED ADMIN — LIDOCAINE-EPINEPHRINE (PF) 1.5 %-1:200000 IJ SOLN: EPIDURAL | @ 11:00:00 | Stop: 2024-01-26 | NDC 63323048837

## 2024-01-26 MED ADMIN — OXYTOCIN BOLUS FROM DRIP: 9.96 [IU] | INTRAVENOUS | @ 23:00:00 | Stop: 2024-01-26 | NDC 99888000037

## 2024-01-26 MED ADMIN — FENTANYL-BUPIVACAINE-NACL 0.5-0.0625-0.9 MG/250ML-% EP SOLN: 250 mL | EPIDURAL | @ 11:00:00 | Stop: 2024-01-26 | NDC 70092126937

## 2024-01-26 MED ADMIN — DEXTROSE 5 %/LACTATED RINGERS: 125 mL/h | INTRAVENOUS | @ 11:00:00 | Stop: 2024-01-26 | NDC 00338012504

## 2024-01-26 MED ADMIN — BENZOCAINE-MENTHOL 20-0.5 % EX AERO: TOPICAL | Stop: 2024-01-29

## 2024-01-26 MED ADMIN — IBUPROFEN 600 MG PO TABS: 600 mg | ORAL | Stop: 2024-01-29 | NDC 67877032005

## 2024-01-26 MED ADMIN — BUPIVACAINE 0.125% INJECTION: EPIDURAL | @ 23:00:00 | Stop: 2024-01-26

## 2024-01-26 MED ADMIN — LACTATED RINGERS IV BOLUS: 1000 mL | INTRAVENOUS | @ 11:00:00 | Stop: 2024-01-26 | NDC 00338011704

## 2024-01-26 NOTE — Progress Notes
 Labor Progress Note    MD to bedside for SVE  Patient feeling strong contractions and more pressure    BP 117/82  ~ Pulse 99  ~ Temp 37.6 ?C (99.7 ?F) (Axillary)  ~ Resp 16  ~ Ht 1.6 m (5' 3'')  ~ Wt 66.7 kg (147 lb)  ~ LMP 04/22/2023 (Exact Date)  ~ SpO2 100%  ~ BMI 26.04 kg/m?     Dilation: 5  Effacement (%): 80  Station: -2  Method: Manual Vag Exam  OB Examiner: Harvel, MD    FHT: baseline 160, mod var, + accels, rare variables decels  Toco: q3-26min    A/P  # Labor  - b-miso @ 1845   - 5/80/-2 @ 0615   - started pit @ 0700   - SROM-c @ 0730   - 10/100/1 @ 1246     # FWB: Category 2 for rare variable decels; reassured by mod variability and accels. Continue noninvasive resuscitative measures PRN.    Plan to start pushing.     GBS neg  Low PPH risk  Anticipate SVD    [place] G. Lupe, MD  01/26/2024 12:50 PM

## 2024-01-26 NOTE — Progress Notes
 Labor Progress Note    MD to bedside for SVE  Patient feeling rectal pressure    BP 114/63  ~ Pulse 89  ~ Temp 36.9 ?C (98.4 ?F) (Oral)  ~ Resp 18  ~ Ht 1.6 m (5' 3'')  ~ Wt 66.7 kg (147 lb)  ~ LMP 04/22/2023 (Exact Date)  ~ SpO2 97%  ~ BMI 26.04 kg/m?     Dilation: 5  Effacement (%): 80  Station: -2  Method: Manual Vag Exam  OB Examiner: Harvel, MD    FHT: baseline 140, mod var, + accels, intermittent late decels  Toco: q3-25min    A/P  # IOL  - now 5/80/-2  - bag palpated and intact  - will plan to move to labor room, start pitocin  and plan for AROM in pattern    # FWB: Category 2 for intermittent late decels; reassured by mod variability and accels. Continue noninvasive resuscitative measures PRN.    GBS neg  Low PPH risk  Anticipate SVD    [name] Siegel C. Harvel, MD  01/26/2024 6:37 AM

## 2024-01-26 NOTE — Progress Notes
 [facility] Department of Obstetrics and Gynecology  Postpartum Progress Note    Kambrey Hagger MRN: [id number]   Age: 38 y.o.  DOB: December 11, 1985 Admit Date: 01/25/2024     Gestational Age at Delivery: [redacted]w[redacted]d Gravida Para: G1P1001      ID: [id number] s/p SVD @ [redacted]w[redacted]d admitted for eIOL. Pregnancy c/b hx beta-2 glycoprotein I +, HSV, IDA, lumbar disc herniation/mild scoliosis.     Subjective:     Patient feels well this AM, no unusual complaints.     Overnight events: none***     Pain: well controlled  Diet: tolerating regular diet with no nausea/vomiting  Voiding: spontaneously  Ambulation: Yes  Bleeding: like a period  Breastfeeding: {Blank single:19197::''initiated, going well'',''initiated, having some trouble''}  Mood: stable    ROS: Denies CP/SOB, dizziness/lightheadedness, fevers/chills, HA, RUQ pain, or changes in vision, N/V    Objective:   Temp:  [36.5 ?C (97.7 ?F)-37.6 ?C (99.7 ?F)] 36.5 ?C (97.7 ?F)  Heart Rate:  [73-127] 98  BP: (98-135)/(58-100) 112/88  Resp:  [16-18] 16  SpO2:  [96 %-100 %] 99 %      Intake/Output Summary (Last 24 hours) at 01/26/2024 1615  Last data filed at 01/26/2024 1508  Gross per 24 hour   Intake 2007 ml   Output 575 ml   Net 1432 ml     GENERAL: NAD  LUNGS: no increased work of breathing, symmetric air entry  ABDOMEN: soft, non-distended, mild tenderness   FUNDUS: firm, at the umbilicus.  EXTREMITIES: symmetric, trace edema bilaterally, no calf tenderness to palpation     Recent Labs     01/25/24  1748   WBC 8.64   HGB 10.8*   HCT 32.8*   MCV 84.1   PLT 243       UPC:   Lab Results   Component Value Date    TPCREAT  08/18/2023      Comment:      Unable to calculate. Test result is below detection limit.       Assessment/Plan:   38 y.o. G1P1001 s/p normal spontaneous vaginal delivery at [redacted]w[redacted]d.   Doing well and meeting all milestones appropriately.    # PPD1: doing well, continue routine PP care  - pain controlled on ibuprofen  and acetaminophen   - encourage ambulation  - meeting milestones except *** # Acute blood loss: 10.3<243 --> EBL 150cc  - no signs/symptoms of anemia    # Beta-2 Glycoprotein I IgM positive   - checked by rheumatologist   - positive x2 across 12 weeks, no clinical criteria for APLS   - s/p MFM consult 02/2023, on bASA throughout pregnancy     # H/o genital herpes   - on valtrex  for suppression   - no active lesions on SSE (01/25/24)     # SSA antibody   - s/p normal PR interval monitoring     # Lumbar disc herniation   # Mild scoliosis   - s/p PEP-C consult (6/20)   - has chronic back pain and has difficulty laying flat for prolonged periods of time     # PNL: Rh pos, RMVI, QFG neg    # Baby: F, *g, 7/9    # Breastfeeding: yes    # Vaccines: s/p Tdap    # ppBCM: f/u PP     # PPx: ambulation    [place] G. Lupe, MD 01/26/2024 4:15 PM  DW Izella MD  DWA Valera Lister, MD, PhD

## 2024-01-27 ENCOUNTER — Ambulatory Visit: Payer: BLUE CROSS/BLUE SHIELD | Attending: Student in an Organized Health Care Education/Training Program

## 2024-01-27 ENCOUNTER — Other Ambulatory Visit: Payer: BLUE CROSS/BLUE SHIELD

## 2024-01-27 DIAGNOSIS — O0993 Supervision of high risk pregnancy, unspecified, third trimester: Principal | ICD-10-CM

## 2024-01-27 MED ORDER — ACETAMINOPHEN 325 MG PO TABS
650 mg | ORAL_TABLET | Freq: Four times a day (QID) | ORAL | 0 refills | 8.00000 days | Status: AC | PRN
Start: 2024-01-27 — End: ?
  Filled 2024-01-28: qty 60, 8d supply, fill #0

## 2024-01-27 MED ORDER — IBUPROFEN 600 MG PO TABS
600 mg | ORAL_TABLET | Freq: Four times a day (QID) | ORAL | 0 refills | 15.00000 days | Status: AC | PRN
Start: 2024-01-27 — End: ?
  Filled 2024-01-28: qty 60, 15d supply, fill #0

## 2024-01-27 MED ORDER — VALACYCLOVIR HCL 500 MG PO TABS
500 mg | ORAL_TABLET | Freq: Two times a day (BID) | ORAL | 0 refills | 30.00000 days | Status: AC
Start: 2024-01-27 — End: 2024-01-27
  Filled 2024-01-29 (×2): qty 30, 15d supply, fill #0

## 2024-01-27 MED ORDER — DSS 100 MG PO CAPS
100 mg | ORAL_CAPSULE | Freq: Two times a day (BID) | ORAL | 0 refills | 30.00000 days | Status: AC
Start: 2024-01-27 — End: ?

## 2024-01-27 MED ADMIN — OXYTOCIN 30 UNITS/500 ML NS RTU: .001.02 [IU]/min | INTRAVENOUS | @ 01:00:00 | Stop: 2024-01-26

## 2024-01-27 MED ADMIN — IBUPROFEN 600 MG PO TABS: 600 mg | ORAL | @ 12:00:00 | Stop: 2024-01-29 | NDC 60687045711

## 2024-01-27 MED ADMIN — ACETAMINOPHEN 325 MG PO TABS: 650 mg | ORAL | @ 17:00:00 | Stop: 2024-01-29 | NDC 00904677361

## 2024-01-27 MED ADMIN — DOCUSATE SODIUM 100 MG PO CAPS: 100 mg | ORAL | @ 04:00:00 | Stop: 2024-01-29 | NDC 00904728080

## 2024-01-27 MED ADMIN — VALACYCLOVIR HCL 500 MG PO TABS: 500 mg | ORAL | @ 17:00:00 | Stop: 2024-01-27 | NDC 68084021511

## 2024-01-27 MED ADMIN — DOCUSATE SODIUM 100 MG PO CAPS: 100 mg | ORAL | @ 17:00:00 | Stop: 2024-01-29 | NDC 00904728080

## 2024-01-27 MED ADMIN — ACETAMINOPHEN 325 MG PO TABS: 650 mg | ORAL | @ 06:00:00 | Stop: 2024-01-29 | NDC 00904677361

## 2024-01-27 MED ADMIN — IBUPROFEN 600 MG PO TABS: 600 mg | ORAL | @ 06:00:00 | Stop: 2024-01-29 | NDC 60687045711

## 2024-01-27 MED ADMIN — IBUPROFEN 600 MG PO TABS: 600 mg | ORAL | @ 17:00:00 | Stop: 2024-01-29 | NDC 60687045711

## 2024-01-27 MED ADMIN — ACETAMINOPHEN 325 MG PO TABS: 650 mg | ORAL | @ 12:00:00 | Stop: 2024-01-29 | NDC 00904677361

## 2024-01-27 MED FILL — VALACYCLOVIR HCL 500 MG PO TABS: 500 500 mg | ORAL | 30 days supply | Qty: 60 | Fill #0

## 2024-01-27 NOTE — Progress Notes
 Pharm[facility] Services - Meds to [facility] Discharge Medication Reconciliation and Counseling Note    Patient Name: [name] [name]  Medical Record Number: [id number]  Admit date: 01/25/2024 3:40 PM    Age: 38 y.o.  Sex: female  Allergies: Patient has no known allergies.    Preferred Pharmacy:   [facility] [phone] - [place]  [place], [place] - [phone] at [facility]  [address]  [place] [address]         I reconciled the discharge medications and counseled the patient on all new prescriptions. Discharge prescriptions were delivered to bedside. The purpose, potential side effects, storage, missed doses and any special instructions related to each medication was discussed. Medications continued from home were also reviewed with the patient.    Discharge Medication List from AVS:     Changes To My Medications        START taking these medications        Dose Instructions Notes   acetaminophen  325 mg tablet  Commonly known as: Tylenol    650 mg   Take 2 tablets (650 mg total) by mouth every six (6) hours as needed for Pain.      docusate 100 mg capsule  Commonly known as: Colace   100 mg   Take 1 capsule (100 mg total) by mouth two (2) times daily.      ibuprofen  600 mg tablet  Commonly known as: Advil , Motrin    600 mg   Take 1 tablet (600 mg total) by mouth every six (6) hours as needed for Mild Pain (Pain Scale 1-3).             CONTINUE taking these medications        Dose Instructions Notes   ferrous sulfate  325 (65 FE) mg EC tablet   325 mg   Take 1 tablet (325 mg total) by mouth every other day.      prenatal vitamin tablet   1 tablet   Take 1 tablet by mouth daily.             STOP taking these medications        STOP taking these medications   valACYclovir  500 mg tablet  Commonly known as: Valtrex                 Prescriptions        These medications were sent to [facility] [phone])  [address], [place] [address]      Hours: Mon-Fri 8AM-9PM, Sat 8AM-7PM, Sun/Holidays 8AM-5PM (Closed 1PM-2PM for lunch) Phone: [phone]   acetaminophen  325 mg tablet  docusate 100 mg capsule  ibuprofen  600 mg tablet     APAP and IBU prn pain with food, no otc   Docusate prn constipation, with fluids   D/c valttrex, pt will finish home supply for prevention   Cont iron and prental     Donnice Canales, PharmD, 01/27/2024, 11:39 AM

## 2024-01-27 NOTE — Progress Notes
 MD to bedside for leg pain.    Patient breastfeeding in bed, comfortable. Reporting tightness in her b/l legs, but able to ambulate and move her legs w/o trouble. On exam, mild swelling in both lower legs, no asymmetry that's c/f DVT, and no tenderness or warmth on palpation. Of note both shins are hairless and shiny, but good cap refill in both big toes.    Plan to monitor symptoms, and reassess overnight for changes.    [place] [name] Shiffman MD  5:53 PM

## 2024-01-27 NOTE — Discharge Instructions
 DISCHARGE INSTRUCTIONS:     Medications for Pain:  - Please take ibuprofen (motrin) 600 mg every 6 hours for pain  - You can take acetaminophen (tylenol) 650 mg every 6 hours for pain as needed    We recommend staggering your ibuprofen and acetaminophen doses, so you are taking one medication every 3 hours. For example, ibuprofen at 9:00am, acetaminophen at 12:00pm, ibuprofen at 3:00pm, acetaminophen at 6:00pm.     - Take miralax 17g mixed with a cup of liquid every day to prevent constipation  - Take iron every other day to help your blood counts come back up after delivery. Take with vitamin C or [place] juice to increase absorption; do not take with a meal.    Activity:   Nothing in the vagina for 6 weeks; no sex, no douching, no tampons.  No baths/hot tubs for 6 weeks. You may shower as usual.   If you have stitches, they will dissolve on their own.    Please call MD or go to the ER for the following:   Fever greater than 100.4 F or 38 C  Headache that does not resolve with Tylenol   Bright white spots in vision unrelated to change in position   Abdominal pain that is not improving with medication   Uncontrolled nausea or vomiting  Vaginal bleeding greater than 1 pads/hr x 2 hours   Foul smelling vaginal discharge   Shortness of breath or chest pain.     Caring for a vaginal tear:  Lower the risk of infection by keeping your stitches clean:  Gently wipe from front to back after you have a bowel movement.  After wiping, spray warm water on the stitches. Pat dry.  After urination, it's OK not to wipe. Just spray with warm water and then pat dry.  Don?t use soap or any solution except water.  Change sanitary pads at least every 2 to 4 hours.    For vaginal pain, try  Sitting in a warm bath (sitz bath).  Placing cold packs or heat packs on your stitches. Keep a thin towel between the pack and your skin.  Sitting on a firm seat so that the stitches pull less.  Using medicated spray like benzocaine      Postpartum depression is a common problem, particularly if depression has been a problem for you in the past. Come to the clinic or emergency room if depression symptoms become severe.   Vaginal bleeding will continue for up to six weeks and at times may be heavy. If the bleeding is enough to completely soak a heavy sanitary pad (overnight type of pad) in one hour and that bleeding continues for more than two hours, you need to return to the hospital emergency room. You may pass blood clots, even golf ball sized clots. This typically occurs on standing, showering or going to the bathroom after you have been laying or sitting down for a while. As long as the bleeding is slow after passing the clot, don't be concerned. Bleeding may decrease over the first couple of weeks and then it can be heavy again; follow the same guidelines to return to the hospital.   Pain at the nipples is common with breast feeding. If you have increasing pain, bleeding or cracked nipples call or come to the clinic. If you have pain deep in the breast, redness on the breast or fever, come to the clinic or emergency room.  Constipation: you can try these medications to  help with bowel movements  Colace 100mg  one tablet by mouth twice daily   Senna 8.6mg  two tablets by mouth at night  Miralax 17g one packet dissolved in water daily    Post-Discharge Appointments: To be scheduled:   6 week postpartum visit     Please call [phone] to make your clinic appointments with your attending physician Dr. Blaine Hamper can My Chart message your primary provider for questions.  If you have an emergency or you are concerned, please call L&D at [phone] for any issues

## 2024-01-27 NOTE — Progress Notes
 [name] [name]  Postpartum Progress Note    [name] [name] MRN: [id number]   Age: 38 y.o.  DOB: Nov 05, 1985 Admit Date: 01/25/2024     Gestational Age at Delivery: 102w6d Gravida Para: G1P1001      ID: [id number] s/p SVD @[redacted]w[redacted]d     Subjective:     Patient feels well this AM, no unusual complaints. Pregnancy c/b AMA, IDA,  hx beta-2 glycoprotein I +, HSV, lumbar disc herniation/mild scoliosis.     Overnight events: none     Pain: well controlled  Diet: tolerating regular diet with no nausea/vomiting  Voiding: spontaneously  Ambulation: Yes  Bleeding: like a period  Breastfeeding: initiated, having some trouble  Mood: stable    ROS: Denies CP/SOB, dizziness/lightheadedness, fevers/chills, HA, RUQ pain, or changes in vision, N/V    Objective:   Temp:  [36.5 ?C (97.7 ?F)-37.6 ?C (99.7 ?F)] 36.5 ?C (97.7 ?F)  Heart Rate:  [67-127] 67  BP: (98-130)/(59-97) 110/80  Resp:  [16-18] 16  SpO2:  [97 %-100 %] 97 %      Intake/Output Summary (Last 24 hours) at 01/27/2024 0824  Last data filed at 01/26/2024 1813  Gross per 24 hour   Intake 437 ml   Output 495 ml   Net -58 ml       GENERAL: NAD  LUNGS: no increased work of breathing, symmetric air entry  ABDOMEN: soft, non-distended, mild tenderness   FUNDUS: firm, at the umbilicus.  EXTREMITIES: symmetric, trace edema bilaterally, no calf tenderness to palpation     Recent Labs     01/25/24  1748   WBC 8.64   HGB 10.8*   HCT 32.8*   MCV 84.1   PLT 243       UPC:   Lab Results   Component Value Date    TPCREAT  08/18/2023      Comment:      Unable to calculate. Test result is below detection limit.       Assessment/Plan:   38 y.o. G1P1001 s/p normal spontaneous vaginal delivery at [redacted]w[redacted]d.   Doing well and progressing toward all milestones appropriately.    # PPD1: doing well, continue routine PP care  - pain controlled on ibuprofen  and acetaminophen   - encourage ambulation  - meeting milestones except lactation help, will work on lactation today and then safe to go home     # Acute blood loss: 10.8<243 --> EBL 150   - no signs/symptoms of anemia    # Heme: 10.8<243 --> EBL 150cc     # Beta-2 Glycoprotein I IgM positive   - checked by rheumatologist   - positive x2 across 12 weeks, no clinical criteria for APLS   - s/p MFM consult 02/2023, on bASA throughout pregnancy   - consider lovenox x6wks if CS     # H/o genital herpes   - on valtrex  for suppression   - no active lesions on SSE (01/25/24)     # SSA antibody   - s/p normal PR interval monitoring     # Lumbar disc herniation   # Mild scoliosis   - s/p PEP-C consult (6/20)   - has chronic back pain and has difficulty laying flat for prolonged periods of time   - patient would like to try multiple positions during labor but does not think she'll have trouble in dorsal lithotomy     # PNL: B Positive / RMVI / QFG neg     #  Baby: female, 49g, APGARS 7 and 9    # Breastfeeding: yes    # Vaccines: s/p Tdap    # ppBCM: f/u PP     # PPx: ambulation    [address], [place]  [name] G. Lupe, [name] 01/27/2024 8:24 AM  [name] [name]  DWA [name] [name]., [name]         This note was authored by medical student, [name] [name], at [place]. This note should not be consulted for clinical, billing, or medico-legal purposes unless a physician has reviewed this note and added an attestation below regarding the accuracy of above documentation.      The resident above was physically present with the medical student during key elements of history, exam, and medical decision making as above. I personally performed a face-to-face encounter with [name] [name] including exam and medical decision making and I agree with plan above.  I have verified the combined documentation above as accurate with my edits and additions   [name] Whetsel D. Sapphire Tygart, [name]

## 2024-01-27 NOTE — Discharge Summary
 PATIENTMakailyn [name]  MRN:  [id number]  DOB:  04-26-1986    ATTENDING AT DISCHARGE: Laurence Jon GRADE., MD  RESIDENT PHYSICIAN: Lang MD  INTERN PHYSICIANBETHA Seip MD    Admission Date: 01/25/2024    Delivery Date: 01/26/2024  Discharge Date: 01/28/2024    Service: Obstetrics    Admission Diagnosis:  1. Intrauterine pregnancy at [redacted]w[redacted]d  2. H/o beta-2 glycoprotein1 Ab  3. HSV  4. IDA  5. Lumbar disc herniation  6. Mild scoliosis      Discharge Diagnosis:  1. 38 y.o. G1P1001 @ [redacted]w[redacted]d s/p SVD   2. Same    Outpatient Work-up/Labs Needed:   none     Unresulted Labs to follow up:  Unresulted Labs (From admission, onward)      None            HPI: [name] [name] is a 38 y.o. now G1P1001 who presented at [redacted]w[redacted]d for IOL.  Please see admission H&P for complete details.    Delivery Course:    The patient was admitted to the hospital, consents were signed, and routine labs drawn.     The delivery course was uncomplicated     Delivering clinician:   Delivery Type: Vaginal, Spontaneous    Baby: Liveborn female  Birth Date: 01/26/2024  Birth Time: 3:08 PM  Gestational Age: [redacted]w[redacted]d    APGAR's  Apgar 1 Minute  Skin color: 1    Heart rate: 2   Reflex irritability: 1    Muscle tone: 2    Respiratory effort: 1    Total: 7  Apgar 5 Minute  Skin color: 1    Heart rate: 2    Reflex irritability: 2    Muscle tone: 2    Respiratory effort: 2    Total: 9  Apgar 10 Minute  Skin color:     Heart rate:     Reflex irritability:     Muscle tone:    Respiratory effort:     Total:    Apgar 15 Minute  Skin color:     Heart rate:     Reflex irritability:     Muscle tone:     Respiratory effort:     Total:   Apgar 20 Minute  Skin color:     Heart rate:     Reflex irritability:     Muscle tone:     Respiratory effort:    Total:      Newborn Measurements  Weight: 7 lb 4.4 oz (3300 g)  Length: 20.87''  Head Circumference: 35 cm    Anesthesia  Method: Epidural   Additional Analgesics:    Lacerations: 1st  Episiotomy: None  Placenta Date & Time: 01/26/2024  3:12 PM    For more information, please see delivery note.    Postpartum course  Total EBL 150. For more information, please see delivery note.    Her pregnancy/postpartum course was uncomplicated.    Her blood pressures were in normal range and she denied any signs or symptoms of preeclampsia .    She was hemodynamically stable with no signs or symptoms of anemia.    By PPD#1, she was meeting all milestones: she was tolerating regular diet, pain was controlled with PO pain medications, she was voiding without issues, bleeding was minimal and she was ambulating well.     She was discharged on PPD#2. For [facility], she opted to follow up postpartum with Dr. Kavi.    Feeding method: breast  Day of Discharge  Last Recorded Vital Signs:    01/28/24 0905   BP: 114/77   Pulse: 68   Resp: 16   Temp: 36.7 ?C (98 ?F)   SpO2: 97%       GENERAL: Alert, oriented, no acute distress  PULM: Normal work of breathing on RA  ABDOMEN: soft, appropriately tender, nondistended, no rebound/guarding  FUNDUS: firm at umbilicus  EXTREMITIES: minimal bilateral edema, symmetrical, no calf tenderness to palpation    Discharge Medications:      Medication List        START taking these medications      acetaminophen  325 mg tablet  Commonly known as: Tylenol   Take 2 tablets (650 mg total) by mouth every six (6) hours as needed for Pain.     docusate 100 mg capsule  Commonly known as: Colace  Take 1 capsule (100 mg total) by mouth two (2) times daily.     ibuprofen  600 mg tablet  Commonly known as: Advil , Motrin   Take 1 tablet (600 mg total) by mouth every six (6) hours as needed for Mild Pain (Pain Scale 1-3).            CONTINUE taking these medications      ferrous sulfate  325 (65 FE) mg EC tablet  Take 1 tablet (325 mg total) by mouth every other day.     prenatal vitamin tablet     valACYclovir  500 mg tablet  Commonly known as: Valtrex   Take 1 tablet (500 mg total) by mouth two (2) times daily.               Where to Get Your Medications        These medications were sent to [facility] [phone])  [address], [place] [address]      Hours: Mon-Fri 8AM-9PM, Sat 8AM-7PM, Sun/Holidays 8AM-5PM (Closed 1PM-2PM for lunch) Phone: [phone]   acetaminophen  325 mg tablet  docusate 100 mg capsule  ibuprofen  600 mg tablet  valACYclovir  500 mg tablet         Disposition: home    Discharge Condition: stable    Discharge Instructions:   DISCHARGE INSTRUCTIONS:     Medications for Pain:  - Please take ibuprofen  (motrin ) 600 mg every 6 hours for pain  - You can take acetaminophen  (tylenol ) 650 mg every 6 hours for pain as needed    We recommend staggering your ibuprofen  and acetaminophen  doses, so you are taking one medication every 3 hours. For example, ibuprofen  at 9:00am, acetaminophen  at 12:00pm, ibuprofen  at 3:00pm, acetaminophen  at 6:00pm.     - Take miralax 17g mixed with a cup of liquid every day to prevent constipation  - Take iron every other day to help your blood counts come back up after delivery. Take with vitamin C or [place] juice to increase absorption; do not take with a meal.    Activity:   Nothing in the vagina for 6 weeks; no sex, no douching, no tampons.  No baths/hot tubs for 6 weeks. You may shower as usual.   If you have stitches, they will dissolve on their own.    Please call MD or go to the ER for the following:   Fever greater than 100.4 F or 38 C  Headache that does not resolve with Tylenol    Bright white spots in vision unrelated to change in position   Abdominal pain that is not improving with medication  Uncontrolled nausea or vomiting  Vaginal bleeding greater than 1 pads/hr x 2 hours   Foul smelling vaginal discharge   Shortness of breath or chest pain.     Caring for a vaginal tear:  Lower the risk of infection by keeping your stitches clean:  Gently wipe from front to back after you have a bowel movement.  After wiping, spray warm water on the stitches. Pat dry.  After urination, it's OK not to wipe. Just spray with warm water and then pat dry.  Don?t use soap or any solution except water.  Change sanitary pads at least every 2 to 4 hours.    For vaginal pain, try  Sitting in a warm bath (sitz bath).  Placing cold packs or heat packs on your stitches. Keep a thin towel between the pack and your skin.  Sitting on a firm seat so that the stitches pull less.  Using medicated spray like benzocaine       Postpartum depression is a common problem, particularly if depression has been a problem for you in the past. Come to the clinic or emergency room if depression symptoms become severe.   Vaginal bleeding will continue for up to six weeks and at times may be heavy. If the bleeding is enough to completely soak a heavy sanitary pad (overnight type of pad) in one hour and that bleeding continues for more than two hours, you need to return to the hospital emergency room. You may pass blood clots, even golf ball sized clots. This typically occurs on standing, showering or going to the bathroom after you have been laying or sitting down for a while. As long as the bleeding is slow after passing the clot, don't be concerned. Bleeding may decrease over the first couple of weeks and then it can be heavy again; follow the same guidelines to return to the hospital.   Pain at the nipples is common with breast feeding. If you have increasing pain, bleeding or cracked nipples call or come to the clinic. If you have pain deep in the breast, redness on the breast or fever, come to the clinic or emergency room.  Constipation: you can try these medications to help with bowel movements  Colace 100mg  one tablet by mouth twice daily   Senna 8.6mg  two tablets by mouth at night  Miralax 17g one packet dissolved in water daily    Post-Discharge Appointments: To be scheduled:   6 week postpartum visit     Please call [phone] to make your clinic appointments with your attending physician Dr Vista Lanius can My Chart message your primary provider for questions.  If you have an emergency or you are concerned, please call L&D at [phone] for any issues         Future Appointments         Provider Department Visit Type    03/08/2024 3:15 PM Veda Bi, DO, MPH OBGYN [facility] POST PARTUM    04/11/2024 1:45 PM Dorthey Bakes, MD, PhD [facility]atology DERM RETURN            The attending, Laurence Jon GRADE., MD, is aware of and agrees with all discharge planning.    Isaiah Seip, MD 01/28/2024       This note was authored by medical student, Nila Feeling, at [place]. This note should not be consulted for clinical, billing, or medico-legal purposes unless a physician has reviewed this note and added an attestation below regarding  the accuracy of above documentation.

## 2024-01-27 NOTE — Lactation Note
 Lactation Note:     Pt: 38 yo, P1, vaginal delivery (150cc EBL)  IDA     Baby: 18 HOL, [redacted]w[redacted]d GA  Birth weight: 7 lb 4.4 oz, AGA  Weight change since birth: 0.61%     Breast assessment:  Mother's nipples: short, flat/retracts when compressed  Breast: large  Colostrum: copious  Pump at home: Spectra       Pt reports baby had latched with nipple shield with RN last night. LC assisted with latching without nipple shield and baby was unable to maintain latch. Discussed pros and cons of nipple shield in the early days. LC taught nipple shield application and baby latched but no active suckling observed. FOB was able to help pt hand express copious colostrum to baby's mouth.   Counseled on pumping if unable to latch baby and if using nipple shield at every feeding.     Intervention:  Education/Counseled:   -Feeding cues/frequency/duration  -Positioning and alignment  -Reviewed pump set up and plan  -Importance of skin to skin   -Reviewed signs of engorgement and care  -Normal newborn behavior   -Alternative feeding - pace bottle feeding  -Pros/cons of shield use  -Infant soothing techniques   -2nd day of life   -Referred to [facility] outpatient lactation program     Plan of Care:  - Continued skin to skin between parent and baby  - Offer breast q2-3h  - Use shield as needed  - Try for 5 min - follow baby's lead  - If no successful latch, pump and feed back EBM + formula to babies in paced bottle  - Monitor diaper output    All questions answered. MOB verbalized understanding.   Encouraged pt to call if any additional questions.      Informed RN to follow up care

## 2024-01-28 ENCOUNTER — Other Ambulatory Visit: Payer: BLUE CROSS/BLUE SHIELD

## 2024-01-28 MED ORDER — VALACYCLOVIR HCL 500 MG PO TABS
500 mg | ORAL_TABLET | Freq: Two times a day (BID) | ORAL | 0 refills | 15.00000 days | Status: AC
Start: 2024-01-28 — End: ?

## 2024-01-28 MED ADMIN — VALACYCLOVIR HCL 500 MG PO TABS: 500 mg | ORAL | @ 07:00:00 | Stop: 2024-01-29 | NDC 68084021511

## 2024-01-28 MED ADMIN — IBUPROFEN 600 MG PO TABS: 600 mg | ORAL | @ 19:00:00 | Stop: 2024-01-29 | NDC 60687045711

## 2024-01-28 MED ADMIN — DOCUSATE SODIUM 100 MG PO CAPS: 100 mg | ORAL | @ 16:00:00 | Stop: 2024-01-29 | NDC 00904728080

## 2024-01-28 MED ADMIN — VALACYCLOVIR HCL 500 MG PO TABS: 500 mg | ORAL | @ 16:00:00 | Stop: 2024-01-29

## 2024-01-28 MED ADMIN — ACETAMINOPHEN 325 MG PO TABS: 650 mg | ORAL | @ 13:00:00 | Stop: 2024-01-29 | NDC 00904677361

## 2024-01-28 MED ADMIN — IBUPROFEN 600 MG PO TABS: 600 mg | ORAL | @ 07:00:00 | Stop: 2024-01-29 | NDC 60687045711

## 2024-01-28 MED ADMIN — ACETAMINOPHEN 325 MG PO TABS: 650 mg | ORAL | Stop: 2024-01-29 | NDC 00904677361

## 2024-01-28 MED ADMIN — ACETAMINOPHEN 325 MG PO TABS: 650 mg | ORAL | @ 06:00:00 | Stop: 2024-01-29 | NDC 00904677361

## 2024-01-28 MED ADMIN — DOCUSATE SODIUM 100 MG PO CAPS: 100 mg | ORAL | @ 06:00:00 | Stop: 2024-01-29 | NDC 00904728080

## 2024-01-28 MED ADMIN — ACETAMINOPHEN 325 MG PO TABS: 650 mg | ORAL | @ 19:00:00 | Stop: 2024-01-29 | NDC 00904677361

## 2024-01-28 MED ADMIN — IBUPROFEN 600 MG PO TABS: 600 mg | ORAL | Stop: 2024-01-29 | NDC 60687045711

## 2024-01-28 MED ADMIN — IBUPROFEN 600 MG PO TABS: 600 mg | ORAL | @ 13:00:00 | Stop: 2024-01-29 | NDC 60687045711

## 2024-01-28 MED FILL — DOCUSATE SODIUM 100 MG PO CAPS: 100 100 mg | ORAL | 30 days supply | Qty: 60 | Fill #0

## 2024-01-28 NOTE — Lactation Note
 Pt: 38 yo, P1, vaginal delivery (150cc EBL)  IDA     Baby: 40 HOL, [redacted]w[redacted]d GA, ''Layla''   Birth weight: 7 lb 4.4 oz, AGA  Weight change since birth: -5.46%      Breast assessment:  Mother's nipples: short, flat/retracts when compressed  Breast: large  Colostrum: copious  Pump at home: Spectra     Pt reports baby has not been latching, pt and FOB have mostly been HE and feeding back colostrum. Colostrum volumes have been between 59mL-9mL. Encouraged parents. Assisted pt with latching baby, baby unable to latch with pinch technique, with the nipple shield baby able to latch and sustain. Baby fed at both breasts, activity off and on. Pt then pumped for 15 min, collected ~.68mL. FOB then HE pt and collected almost 5mL, syringe fed to baby.    Reviewed:  Milk transition timeline  Milk make system  When formula indicated   Appropriate volumes for DOL  Nipple shield application and use  Triple feeding plan- modify  Ways to know baby getting enough  Diaper output expectations    POC:  Continued skin to skin between parent and baby  - Offer breast q2-3h  - Use shield as needed, observe for activity, identify swallows  - Pump and feed back EBM, use HE as needed. If volumes insufficient use formula in paced bottle  - Monitor diaper output    Follow up with OP Lactation support  Sending Spectra  settings via MyChart

## 2024-01-28 NOTE — Other
 Patient's Clinical Goal:   Clinical Goal(s) for the Shift: VSS, minimal bleeding, pain control, feeding on demand  Identify possible barriers to advancing the care plan: none  Stability of the patient: Moderately Stable - low risk of patient condition declining or worsening   Progression of Patient's Clinical Goal: VSS, minimal bleeding, pain controlled by Tylenol  and Motrin , saw lactation consultant, received d'c medication, reviewed appointments, stable d'c home with baby.

## 2024-01-28 NOTE — Progress Notes
 [place] Department of Obstetrics and Gynecology  Postpartum Progress Note    [name] [name] MRN: [id number]   Age: 38 y.o.  DOB: 1986/04/21 Admit Date: 01/25/2024     Gestational Age at Delivery: [redacted]w[redacted]d Gravida Para: G1P1001      ID: 62bn [id number] s/p SVD @[redacted]w[redacted]d  admitted for eIOL. Pregnancy c/b hx beta-2 glycoprotein I +, HSV, IDA, lumbar disc herniation/mild scoliosis. PPD2    Subjective:     Patient feels well this AM, no unusual complaints. Had leg tightness resolved with massage    Overnight events: none     Pain: well controlled  Diet: tolerating regular diet with no nausea/vomiting  Voiding: spontaneously  Ambulation: Yes  Bleeding: like a period  Breastfeeding: initiated, having some trouble  Mood: stable    ROS: Denies CP/SOB, dizziness/lightheadedness, fevers/chills, HA, RUQ pain, or changes in vision, N/V    Objective:   Temp:  [36.6 ?C (97.9 ?F)-36.8 ?C (98.2 ?F)] 36.7 ?C (98 ?F)  Heart Rate:  [68-93] 68  BP: (111-118)/(72-80) 114/77  Resp:  [16-18] 16  SpO2:  [96 %-100 %] 97 %    No intake or output data in the 24 hours ending 01/28/24 1143      GENERAL: NAD  LUNGS: no increased work of breathing, symmetric air entry  ABDOMEN: soft, non-distended, mild tenderness  FUNDUS: firm, at the umbilicus.  EXTREMITIES: symmetric, trace edema bilaterally, no calf tenderness to palpation. No erythema    Recent Labs     01/25/24  1748   WBC 8.64   HGB 10.8*   HCT 32.8*   MCV 84.1   PLT 243       UPC:   Lab Results   Component Value Date    TPCREAT  08/18/2023      Comment:      Unable to calculate. Test result is below detection limit.       Assessment/Plan:   38 y.o. G1P1001 s/p normal spontaneous vaginal delivery at [redacted]w[redacted]d.   Doing well and meeting all milestones appropriately.    # PPD2: doing well, continue routine PP care  - pain controlled on ibuprofen  and acetaminophen   - meeting milestones, will see lactation again today before dc home    # Acute blood loss: 10.8<243 --> EBL 150   - no signs/symptoms of anemia    # Heme: 10.8<243 --> EBL 150cc     # Beta-2 Glycoprotein I IgM positive   - checked by rheumatologist   - positive x2 across 12 weeks, no clinical criteria for APLS   - s/p MFM consult 02/2023, on bASA throughout pregnancy     # H/o genital herpes   - on valtrex  for suppression   - no active lesions admit SSE    # SSA antibody   - s/p normal PR interval monitoring     # PNL: B Positive / RMVI / QFG neg     # Baby: female, 3300g, APGARS 7 and 9    # Breastfeeding: yes    # Vaccines: s/p Tdap    # ppBCM: f/u PP     # PPx: ambulation    Rolin Essex MD 01/28/2024 11:43 AM  DWA Laurence Jon GRADE., MD    I have reviewed the notes, assessments, and/or procedures performed together with  Doctor Essex , I concur with the documentation of [facility].  I was present and participated in all procedures.    Jon GRADE. Laurence, MD

## 2024-01-30 ENCOUNTER — Ambulatory Visit: Payer: BLUE CROSS/BLUE SHIELD

## 2024-02-03 ENCOUNTER — Other Ambulatory Visit: Payer: Self-pay

## 2024-02-08 ENCOUNTER — Ambulatory Visit: Payer: Self-pay

## 2024-02-08 DIAGNOSIS — O99345 Other mental disorders complicating the puerperium: Secondary | ICD-10-CM

## 2024-02-08 DIAGNOSIS — F418 Other specified anxiety disorders: Principal | ICD-10-CM

## 2024-02-10 ENCOUNTER — Other Ambulatory Visit: Payer: Self-pay

## 2024-02-11 ENCOUNTER — Other Ambulatory Visit: Payer: Self-pay

## 2024-02-12 MED ORDER — DICLOXACILLIN SODIUM 500 MG PO CAPS
500 mg | ORAL_CAPSULE | Freq: Four times a day (QID) | ORAL | 0 refills | Status: AC
Start: 2024-02-12 — End: ?

## 2024-02-13 ENCOUNTER — Telehealth: Payer: Self-pay

## 2024-02-13 ENCOUNTER — Other Ambulatory Visit: Payer: PRIVATE HEALTH INSURANCE

## 2024-02-13 NOTE — Telephone Encounter
 C/f transient egorgement, pain in breast  Wants call before visit  Lm for patient - sent mychart

## 2024-02-21 ENCOUNTER — Other Ambulatory Visit: Payer: BLUE CROSS/BLUE SHIELD

## 2024-02-28 ENCOUNTER — Ambulatory Visit: Payer: PRIVATE HEALTH INSURANCE

## 2024-03-05 ENCOUNTER — Other Ambulatory Visit: Payer: PRIVATE HEALTH INSURANCE

## 2024-03-08 ENCOUNTER — Telehealth: Payer: PRIVATE HEALTH INSURANCE

## 2024-03-08 ENCOUNTER — Ambulatory Visit: Payer: Self-pay

## 2024-03-08 DIAGNOSIS — D6861 Antiphospholipid syndrome: Secondary | ICD-10-CM

## 2024-03-08 DIAGNOSIS — A609 Anogenital herpesviral infection, unspecified: Secondary | ICD-10-CM

## 2024-03-08 DIAGNOSIS — F320 Major depressive disorder, single episode, mild: Secondary | ICD-10-CM

## 2024-03-08 DIAGNOSIS — R102 Pelvic and perineal pain: Secondary | ICD-10-CM

## 2024-03-08 MED ORDER — VALACYCLOVIR HCL 1 G PO TABS
1000 mg | ORAL_TABLET | Freq: Every day | ORAL | 3 refills | 30.00000 days | Status: AC
Start: 2024-03-08 — End: ?

## 2024-03-08 NOTE — Progress Notes
 Department of Obstetrics and Gynecology   Outpatient Obstetrics Postpartum Note     DATE OF VISIT: 03/08/2024    PATIENT:  [name] [name]  MRN:  [id number]  DOB:  1985/07/04  PRIMARY CARE PROVIDER: Orma Toribio DEL., MD  CC: 6 week postpartum follow-up  S: [name] is a 38 y.o. female G1P1001 s/p NSVD at [redacted]w[redacted]d and was uncomplicated who presents for her 6 week postpartum visit.  Pain: Pain only moderately well controlled with pain meds  Lochia: Resolved, without discharge.   Intercourse: Not yet resumed.  Breastfeeding: Breastfeeding exclusively without issues.  Bonding with baby: Bonding well with baby.  Mood: .  Edinburgh Postnatal Depression Scale taken today with score = 14  She has no other complaints at this time.  O:   PHYSICAL EXAM:    Vitals:    03/08/24 1546   BP: 101/67   Pulse: 86   Temp: 37 ?C (98.6 ?F)   Weight: 123 lb (55.8 kg)       Body mass index is 21.79 kg/m?SABRA    GENERAL: NAD, A&O x 4.    HEENT: No apparent trauma, no thyromegaly.    CV: Regular rate and rhythm.    RESP: Normal breathing rate. Normal respiratory effort.    ABDOMEN: Soft, non-tender. No palpable masses. No hepatosplenomegaly.    EXT: No clubbing, cyanosis, edema bilaterally. Negative Homan's sign bilaterally    Extremities: no lower extremity edema, no calf tenderness.  Negative Homan's sign bilaterally.    New/ Relevant Labs:   B Positive  6 wk GTT? N/A    Assessment/Plan:   [name] is a 39 y.o. female G1P1001 s/p NSVD at [redacted]w[redacted]d and was uncomplicated who presents for her 6 week postpartum visit.     Postpartum visit and Well woman check  - Emotionally/physically not doing well.  Had a long discussion today about normal and abnormal postpartum findings.  She and her husband seem overwhelmed by being new parents.  See below.  - no vaginal/pelvic exam done today per patient request.  Feels she is still needing time to heal.  - Pap smear up to date. Next due 2026  - appropriate weight loss   - Breastfeeding: going well  - Vaccines   Immunization History   Administered Date(s) Administered    COVID-19, mRNA, (Pfizer - Purple Cap) 30 mcg/0.3 mL 06/24/2019, 07/18/2019, 04/27/2020    PPD Test 12/10/2016    Tdap 12/27/2016, 11/14/2023    influenza vaccine IM quadrivalent (Fluarix Quad) (PF) SYR (42 months of age and older) 04/04/2018, 04/27/2019, 04/23/2020, 04/07/2022    influenza, unspecified formulation 04/04/2018, 04/06/2018, 04/23/2021       2. Contraception counseling  - Contraception Counseling    We discussed all contraception options (including transdermal patch, OCPs, Vaginal ring, injectables, implantables, barrier) in great detail.  We discussed long-term reversible options such as Nexplanon and intrauterine devices as well.   Lastly, we also discussed permanent irreversible sterilization options including salpingectomies and partner vasectomies.   We discussed typical side effects/serious adverse effects for each of the above contraception options.  Appropriate education materials regarding the contraception options were also offered to the patient.   All of the patients questions were answered.     ppBCM: condoms/barrier    3. Postpartum depression  - EPDS 57  - has been going through couple's therapy with husband [name] [name].  Discussed referral to [facility] clinic.  She is amenable.  Referral placed  - extend off work note until  04/13/2024, would like patient to return to see me in 3-4 weeks for follow up  4.  History of HSV  - Rx Valtrex  given   5.  Pelvic pain/back pain  - Referral to Physical therapy    6.  History of possible Antiphospholipid antibody syndrome  - Labs were elevated  No clinical diagnosis as patient has not had personal/family history of VTE, and no recurrent pregnancy losses  - patient and partner very concerned about next pregnancy  - discussed that we can repeat APLS labs, and without true clinical markers, likely not a true diagnosis.  They would like to avoid Lovenox and early induction in next pregnancy, as they found this to be traumatic.    RTC 4 weeks; sooner if any questions or concerns.    Orders Placed This Encounter    Cardiolipin Ab Quant    Beta-2-Glycoprotein Antibody Panel    Dilute Russell Viper Venom Time    Referral to [facility], Physical Therapy    Referral to Obsetrics & Gynecology, Lactation Services    Referral to Obstetrics & Gynecology, [facility])    valACYclovir  1000 mg tablet     .    More than 50% of the visit was spent in counseling and coordination of care. Total encounter time for face-to-face and non-face-to-face time:  45 minutes.    Electronically Signed by,    Ruel Heck, DO, JD, MPH, [facility]  Attending Physician, Obstetrics & Gynecology  [facility], Clinic [address]  03/08/2024 6:11 PM

## 2024-03-12 ENCOUNTER — Other Ambulatory Visit: Payer: Self-pay

## 2024-03-12 ENCOUNTER — Other Ambulatory Visit: Payer: BLUE CROSS/BLUE SHIELD

## 2024-03-12 DIAGNOSIS — N61 Mastitis without abscess: Principal | ICD-10-CM

## 2024-03-13 MED ORDER — DICLOXACILLIN SODIUM 500 MG PO CAPS
500 mg | ORAL_CAPSULE | Freq: Four times a day (QID) | ORAL | 1 refills | Status: AC
Start: 2024-03-13 — End: ?

## 2024-03-14 ENCOUNTER — Other Ambulatory Visit: Payer: Self-pay

## 2024-03-15 ENCOUNTER — Telehealth: Payer: PRIVATE HEALTH INSURANCE

## 2024-03-15 NOTE — Telephone Encounter
 Call Back Request      Reason for call back: Patient spoke with [facility] and regarding mastitis symptoms but was advised her MD is out of the office. Pt was previously seeing Dr Missy and asked if she could be seen in [facility]. She was told to call [facility] location and see if they are able to get her in. Pt said she previously saw Dr Missy but open to anyone    Any Symptoms:  []  Yes  [x]  No      If yes, what symptoms are you experiencing:    Duration of symptoms (how long):    Have you taken medication for symptoms (OTC or Rx):      If call was taken outside of clinic hours:    [] Patient or caller has been notified that this message was sent outside of normal clinic hours.     [] Patient or caller has been warm transferred to the physician's answering service. If applicable, patient or caller informed to please call us  back if symptoms progress.  Patient or caller has been notified of the turnaround time of 1-2 business day(s).

## 2024-03-16 ENCOUNTER — Telehealth: Payer: Self-pay

## 2024-03-16 ENCOUNTER — Other Ambulatory Visit: Payer: BLUE CROSS/BLUE SHIELD

## 2024-03-16 ENCOUNTER — Ambulatory Visit: Payer: BLUE CROSS/BLUE SHIELD

## 2024-03-16 ENCOUNTER — Ambulatory Visit: Payer: PRIVATE HEALTH INSURANCE

## 2024-03-16 NOTE — Telephone Encounter
 Spoke to pt to assist in scheduling, offered pt appt for 9/22 with Dr.Tsai pt accepted and has been scheduled

## 2024-03-16 NOTE — Telephone Encounter
 Appointment Accommodation Request      Appointment Type: Return Gyn    Reason for sooner request: Pt would like to know if Dr. Missy is able to see her today for mastitis. Pt cancelled her appt that was scheduled in [facility] because she stated that she would not be able to make it there.    Date/Time Requested (If any): Today    Last seen by MD: 01/10/2024    Any Symptoms:  []  Yes  []  No      If yes, what symptoms are you experiencing:   Duration of symptoms (how long):     Patient or caller was offered an appointment but declined.    Patient or caller was advised to seek emergency services if conditions are urgent or emergent.    Patient or caller has been notified of the turnaround time of 1-2 business (days).

## 2024-03-16 NOTE — Telephone Encounter
 Patient is scheduled in [facility] today Fri 03/16/24 with a generalists to see Dr. Azalia for mastitis.

## 2024-03-19 ENCOUNTER — Other Ambulatory Visit: Payer: Self-pay

## 2024-03-19 ENCOUNTER — Ambulatory Visit: Payer: PRIVATE HEALTH INSURANCE

## 2024-03-19 DIAGNOSIS — N61 Mastitis without abscess: Principal | ICD-10-CM

## 2024-03-21 NOTE — Progress Notes
 Department of Obstetrics and Gynecology  Outpatient Progress Note    PATIENT:  [name] [name]  MRN:  [id number]  DOB:  1986/02/28  DATE OF SERVICE:  03/19/24  PRIMARY CARE PROVIDER: Orma Toribio DEL., MD     Chief Complaint:   Chief Complaint   Patient presents with    mastitis       History of Present Illness: [name] [name] is a 38 y.o. F who presents for second episode of mastitis. NSVD 01/26/24.  History of Present Illness    - [name] [name], 38 year old female  - Started antibiotics for breast symptoms 9/15  - Intermittent sharp pain from breast to nipple, less frequent now  - Past Sunday night experienced chills while at brother's house  - Following day on Monday pain increased, occurring every 5-10 minutes, then became constant at night; nearly crying from severity  - Used ibuprofen , Tylenol , and heating pad for pain relief  - By Wednesday had taken two doses of antibiotics; noted large red rash and lump on right breast  - Pain previously present in both breasts, but lump and rash localized to right breast  - Sore nipples, tenderness persists, pain now occasional and brief  - Completed 7-day course of antibiotics previously; started new 10-day course per Dr. Vista after messaging about severe pain  - Denies fever during both episodes, though experienced chills and sweating during first episode  - Missed one or two doses of antibiotics recently due to pharmacy refill delay  - Postpartum recovery improving  - Breastfeeding and pumping, combination feeding; baby sometimes frustrated at breast, often receives bottle of breast milk  - Feels breast warmth, especially on right side during previous episode  - Reports producing variable amounts of milk from each breast, sometimes more from right, sometimes less  - Pumped 20 minutes before visit, obtained nearly 9 ounces         She has no other gynecologic complaints.    ROS: She denies nausea, vomiting, vaginal discharge, and dysuria.    Current Outpatient Medications   Medication Sig    acetaminophen  325 mg tablet Take 2 tablets (650 mg total) by mouth every six (6) hours as needed for Pain.    dicloxacillin  500 mg capsule Take 1 capsule (500 mg total) by mouth four (4) times daily for 10 days.    docusate 100 mg capsule Take 1 capsule (100 mg total) by mouth two (2) times daily.    ferrous sulfate  325 (65 FE) mg EC tablet Take 1 tablet (325 mg total) by mouth every other day.    ibuprofen  600 mg tablet Take 1 tablet (600 mg total) by mouth every six (6) hours as needed for Mild Pain (Pain Scale 1-3).    Prenatal Vit-Fe Fumarate-FA (PRENATAL VITAMIN) tablet Take 1 tablet by mouth daily.    valACYclovir  1000 mg tablet Take 1 tablet (1,000 mg total) by mouth daily.     No current facility-administered medications for this visit.      No Known Allergies      Objective:     Vitals:   BP 104/69  ~ Pulse 91  ~ Wt 123 lb 3.2 oz (55.9 kg)  ~ LMP 04/22/2023 (Exact Date)  ~ Breastfeeding Yes  ~ BMI 21.82 kg/m?     Physical Exam:  General: no apparent distress, A&Ox3  HEENT: NC/AT, no thryomegaly, EOMI  Breasts: symmetric, no masses, skin changes, erythema or nipple discharge bilaterally. Lactating   Cardiovascular: regular rate and rhythm  Pulmonary:  clear to auscultation bilaterally  Abdomen: soft, non-tender, non-distended, no rebound/guarding, no masses palpated  Back: no CVA tenderness  Pelvic: def  Extremities: no lower extremity edema, no calf tenderness     Labs:  Results            Admission on 01/25/2024, Discharged on 01/28/2024   Component Date Value Ref Range Status    State Law 01/25/2024 Notify patient   Final    State law requires that the woman tested be informed of the Rhesus (Rh or D) typing test results.    Antibody Screen 01/25/2024 Negative   Final    ABORh 01/25/2024 B Positive   Final    White Blood Cell Count 01/25/2024 8.64  4.16 - 9.95 x10E3/uL Final    Red Blood Cell Count 01/25/2024 3.90 (L)  3.96 - 5.09 x10E6/uL Final    Hemoglobin 01/25/2024 10.8 (L)  11.6 - 15.2 g/dL Final    Hematocrit 92/69/7974 32.8 (L)  34.9 - 45.2 % Final    Mean Corpuscular Volume 01/25/2024 84.1  79.3 - 98.6 fL Final    Mean Corpuscular Hemoglobin 01/25/2024 27.7  26.4 - 33.4 pg Final    MCH Concentration 01/25/2024 32.9  31.5 - 35.5 g/dL Final    Red Cell Distribution Width-SD 01/25/2024 42.6  36.9 - 48.3 fL Final    Red Cell Distribution Width-CV 01/25/2024 13.9  11.1 - 15.5 % Final    Platelet Count, Auto 01/25/2024 243  143 - 398 x10E3/uL Final    Mean Platelet Volume 01/25/2024 11.3  9.3 - 13.0 fL Final    Nucleated RBC%, automated 01/25/2024 0.0  No Ref. Range % Final    Percent Reference Range Not Reported per accrediting agency    Absolute Nucleated RBC Count 01/25/2024 0.00  0.00 - 0.00 x10E3/uL Final    RPR, Prenatal 01/25/2024 Nonreactive  Nonreactive Final       Imaging:   Chart review    Assessment/Plan:   [name] is a 38 y.o. F who presents for mastitis, second episode within short period of time.  Assessment & Plan     Mastitis:  - Mastitis due to infection of a breast duct, likely secondary to a clogged duct. No current evidence of abscess or ongoing infection on examination. Symptoms have improved with antibiotics and supportive measures.  - Complete the 10-day course of antibiotics as prescribed. Use ibuprofen  and acetaminophen  as needed for pain. Recommended lecithin supplement to help prevent future clogged ducts. Advised to pump or feed every 3 to 4 hours during the day, and allow longer intervals at night for comfort and recovery. Do not exceed 20 minutes per pumping session to avoid overproduction, especially as her supply is ample. Monitor for signs of recurrent infection, including persistent fever, erythema, or warmth. Encouraged to let infant self-regulate feeding and not to overproduce breast milk. Provided guidance on direct breastfeeding and pumping strategies. Offered ongoing support via [employer].         RTC PRN; sooner if any questions or concerns.    No orders of the defined types were placed in this encounter.      She has our contact information for any questions or concerns.    She asked appropriate questions, these were answered to her satisfaction.    No testing performed today.    26 minutes were spent personally by me today on this encounter which include today's pre-visit review of the chart, obtaining appropriate history, performing an evaluation, documentation and  discussion of management with details supported within the note for today's visit. The time documented was exclusive of any time spent on the separately billed procedure.       Author: Rosaline BROCKS. Micheal, MD 03/21/2024 9:49 PM

## 2024-04-09 ENCOUNTER — Ambulatory Visit: Payer: PRIVATE HEALTH INSURANCE

## 2024-04-09 ENCOUNTER — Ambulatory Visit: Payer: Self-pay | Attending: Student in an Organized Health Care Education/Training Program

## 2024-04-11 ENCOUNTER — Ambulatory Visit: Payer: PRIVATE HEALTH INSURANCE | Attending: Student in an Organized Health Care Education/Training Program

## 2024-04-11 ENCOUNTER — Ambulatory Visit: Payer: PRIVATE HEALTH INSURANCE

## 2024-04-11 DIAGNOSIS — R102 Pelvic pain: Secondary | ICD-10-CM

## 2024-04-11 NOTE — Progress Notes
 [facility]  GYNECOLOGY CLINIC VISIT    PATIENT: [name] [name]  MRN: [id number]  DOB: 21-Jul-1985  DATE OF SERVICE: 04/11/2024    PRIMARY CARE PROVIDER: Orma Toribio DEL., MD    REASON FOR VISIT: postpartum follow up    No chief complaint on file.      Subjective:       History of Present Illness:  [name] is a 38 y.o. G65P1001  female here today in GYN clinic for postpartum follow up.  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 No longer present    Seasonal allergies     Sexually transmitted infection     Herpes     Past Surgical History:   Procedure Laterality Date    WISDOM TOOTH EXTRACTION       Outpatient Medications Prior to Visit   Medication Sig    acetaminophen  325 mg tablet Take 2 tablets (650 mg total) by mouth every six (6) hours as needed for Pain.    docusate 100 mg capsule Take 1 capsule (100 mg total) by mouth two (2) times daily.    ferrous sulfate  325 (65 FE) mg EC tablet Take 1 tablet (325 mg total) by mouth every other day.    ibuprofen  600 mg tablet Take 1 tablet (600 mg total) by mouth every six (6) hours as needed for Mild Pain (Pain Scale 1-3).    Prenatal Vit-Fe Fumarate-FA (PRENATAL VITAMIN) tablet Take 1 tablet by mouth daily.    valACYclovir  1000 mg tablet Take 1 tablet (1,000 mg total) by mouth daily.     No facility-administered medications prior to visit.     No Known Allergies  Family History   Problem Relation Age of Onset    Skin cancer Paternal Grandfather     Stroke Paternal Grandfather     Stroke Paternal Grandmother     Arthritis Paternal Grandmother     Breast cancer Maternal Grandmother 24    Gout Maternal Grandfather     Scleroderma Father     Arthritis Father     Lupus Mother     Arthritis Mother     Breast cancer Maternal Great-Grandmother     Colon cancer Neg Hx     Endometrial cancer Neg Hx     Ovarian cancer Neg Hx      Social History     Socioeconomic History    Marital status: Single   Occupational History    Occupation: psychology fellow     Employer: [employer]   Tobacco Use    Smoking status: Never    Smokeless tobacco: Never   Substance and Sexual Activity    Alcohol use: Not Currently     Alcohol/week: 0.6 oz     Types: 1 Glasses of Wine (5 oz) per week     Comment: social     Drug use: No    Sexual activity: Yes     Partners: Male     Birth control/protection: Pregnant   Other Topics Concern    Do you exercise at least a day, 3 or more days a week? Yes    Do you follow a special diet? No    Vegan? No  Vegetarian? No    Pescatarian? No    Lactose Free? No    Gluten Free? No    Omnivore? No    Types of Exercise? (List in Comments) Yes     Comment: Walking/hiking, home workouts   Social History Narrative    Diet: omnivore. meat rarely. Fast food, none. Soda, none. +veggies +fruits.    Exercise: electric biking. Walking. VR workouts. home workouts. 3+ days per week.    Alcohol: 1-2 weekly on weekends, 2 drinks on average.    Occupation: psychologist at [employer]    Sleep: improved, but still some difficulties, mostly regarding temperature       Review of Systems:  Skin: No new lesions or rash, Thyroid: No hot or cold intolerance, Lungs: No shortness of breath, cough or palpitations, Heart: No significant chest pain or palpitations, Gastrointestinal: No abdominal pain, diarrhea or constipation, Genitourinary: No current hematuria, dysuria, or urinary infection, Musculoskeletal: No joint, muscle, or bone pains, Neurologic: No numbness, tingling, or weakness, Constitutional: No fever or night sweats, , and Psychiatric: No complaints       Objective:      Physical Exam:  BP 103/71  ~ Pulse 82  ~ Temp 36.9 ?C (98.5 ?F) (Forehead)  ~ LMP 04/22/2023 (Exact Date)     PHYSICAL EXAM:    Vitals:    04/11/24 1512   BP: 103/71   Pulse: 82   Temp: 36.9 ?C (98.5 ?F)   TempSrc: Forehead       There is no height or weight on file to calculate BMI.    GENERAL: NAD, A&O x 4.    HEENT: No apparent trauma, no thyromegaly.    CV: Regular rate and rhythm.    RESP: Normal breathing rate. Normal respiratory effort.    ABDOMEN: Soft, non-tender. No palpable masses. No hepatosplenomegaly.    EXT: No clubbing, cyanosis, edema bilaterally. Negative Homan's sign bilaterally    PELVIC EXAM: Chaperoned by female assistant.    External genitalia: Normal, no lesions, no masses.  Urethra: normal appearing  Vagina: Pink, moist, no lesions or masses. No abnormal discharge or bleeding      Assessment/Plan:  Postpartum follow up  - healing well  - OK to resume normal activity  - pelvic PT referral placed last visit, but patient has not had appt yet.  Will provide number to schedule.      RTC PRN or for annual exam    Electronically Signed by,    Ruel Heck, DO, JD, MPH, [facility]  Attending Physician, Obstetrics & Gynecology  [facility], Clinic [address]  04/11/2024 3:39 PM

## 2024-04-22 ENCOUNTER — Other Ambulatory Visit: Payer: BLUE CROSS/BLUE SHIELD

## 2024-04-22 ENCOUNTER — Telehealth: Payer: PRIVATE HEALTH INSURANCE

## 2024-04-22 MED ORDER — DICLOXACILLIN SODIUM 500 MG PO CAPS
500 mg | ORAL_CAPSULE | Freq: Four times a day (QID) | ORAL | 0 refills | 10.00000 days | Status: AC
Start: 2024-04-22 — End: 2024-04-23

## 2024-04-22 MED ORDER — DICLOXACILLIN SODIUM 500 MG PO CAPS
500 mg | ORAL_CAPSULE | Freq: Four times a day (QID) | ORAL | 0 refills | 10.00000 days | Status: AC
Start: 2024-04-22 — End: ?

## 2024-04-22 MED ORDER — DICLOXACILLIN SODIUM 500 MG PO CAPS
500 mg | ORAL_CAPSULE | Freq: Four times a day (QID) | ORAL | 0 refills
Start: 2024-04-22 — End: ?

## 2024-04-23 MED ORDER — DICLOXACILLIN SODIUM 500 MG PO CAPS
500 mg | ORAL_CAPSULE | Freq: Four times a day (QID) | ORAL | 0 refills | 10.00000 days | Status: AC
Start: 2024-04-23 — End: ?

## 2024-05-31 ENCOUNTER — Ambulatory Visit: Payer: BLUE CROSS/BLUE SHIELD

## 2024-05-31 NOTE — Patient Instructions
 Hyperlactation   - Often developed in exclusive pumping   - Breasts are trying to regulate - to you make what baby is taking. - 24-32oz per day   - Reduce excessive pumping time and volumes produced - SLOWLY - by a few minutes in each session   - Keep a consistent schedule   - Try spectra , momcozy x3, Spectra  - so that you can pump on time   - Watch your times pumping   - See plugged ducts and blebs below      Routines for babies  http://www.secretsofbabybehavior.com/2009/09/repetition-in-babys-daily-life-power-of.html    Babies need consistent routines around 4 points per day:    Wake up - morning routine  Mid morning nap  Early afternoon nap  Bedtime.    After 8 weeks postpartum:     Develop a routine to get baby down to sleep that does not involved falling asleep at the breast for these three sleep episodes (2 day time naps and bedtime.)    If baby wakes before midnight - soothe back to sleep, but don't feed until after midnight.  This will help baby get onto a better, more restful routine for sleep.    At 12 weeks, some babies will drop to 3 naps per day   - short early morning nap, two longer daytime naps (mid morning and early afternoon - with 1-2 middle of the night feeds (after midnight.)  At 6 months they will drop the early morning short nap  At 1 year they will consolidate the two longer daytime naps into 1 nap    Activities - baby may need more activities when awake.  Grow Your Baby's Brain - 0--3 months:  [email or web address]  Tummy time - 5 essential moves:  [email or web address]  Rough routine for 7 feeds and 3-4 naps by 12 weeks and 12 pounds  5am    Morning routine - FEED-Early morning feed  6am   NAP - Short nap - 60-90 min  8am  FEED - Breakfast & Activity  9:30am NAP - Long Nap - 90-161min  11:00pm FEED - Lunch & Activity  12:30pm  NAP - Long Nap - 90-12min  2:30pm FEED - Snack & Activity   3:30pm NAP (lots of holding, some cat naps)  5:00pm  FEED - Dinner  7:00pm FEED before Bedtime Routine  7:30pm  SLEEP Bedtime    There may be one more middle of the night feeding.       https://www.healthychildren.org/English/family-life/family-dynamics/Pages/The-Importance-of-Family-Routines.aspx    http://www.[email or web address]    Website: www.[email or web address]  Top 5 Sleep Blog Posts: http://www.secretsofbabybehavior.c  om/2013/09/our-top-5-sleep-blogs.html   #5: The Science of Infant Sleep Part II: Big Changes in Sleep Patterns (6 to 16 weeks)  #4: Three Newborn Behaviors That Can Mislead Parents  #3: Why We Don't Like ?Sleep Training? for Babies: Part 1  #2: Science of Infant Sleep Part I: The First 6 Weeks  #1: Why Do Some Babies Hate Being Drowsy?        Plugged ducts - are:   - Ductal inflammation and congestion in the breast tissue    Conservative treatment:    - Continue to breastfeed to feeding cue at least 8x in 24 hours during the newborn period.   - If you exclusively pump - pump your normal schedule   - There is no need to pump or nurse more on the affected side    - over pumping can cause plugged ducts - call for support on how to down-regulate your supply if you are  pumping too much milk   - What is too much milk?  A normal milk supply is 24-32oz - if you are fully breastfeeding a baby, you just need to pump to replace a missed feeding (the pump ''stands in'' for the baby)   - Wear a well fitted bra with some compression and elevation - not too tight, not too loose - day and night until symptoms resolve basicbling   - Rest - Rest- Rest - with feet up -as much as possible   - Alternate heat / ice for 15 min for comfort   - Advil  for inflammation / Tylenol  for pain - Make sure your doctor is ok with you using OTC medications     -Sunflower seed lecithin - take 1200mg  capsules,  4x per day - (meals, before bed)   - Feeling extra ''cloggy'' you can double this dose - 2 capsules, 4x per day (meals, before bed)   - when symptoms resolve, you can wean down to 2x per day (morning/night)    -Probiotics   - Some mothers find this to be helpful     Don't massage the breast ever: [email or web address]    YES - Lymphatic Drainage:  [email or web address]  Video [email or web address]    If not resolving with conservative measures...     Contact your obstetric provider if you develop signs of infection:   Fever (temperature over 100.4?F or 38?C) for 24h+  Body aches   Beefy red areas on the breasts with [place] peel appearance  Engorgement / fullness is not improving within 48-72 hours    Not all redness, fever and body aches are infectious mastitis which requires antibiotics:  [email or web address]      Nipple bleb:   - Ductal inflammation   - HATI - heat, Advil , Tylenol  Ice    - Make sure your doctor is ok with you using OTC anti-inflammatory meds (Advil /Tylenol )    -Sunflower seed lecithin - take 1200mg  capsules,  4x per day - (meals, before bed)   - Feeling extra ''cloggy'' you can double this dose - 2 capsules, 4x per day (meals, before bed)   - when Bleb symptoms resolve, you can wean down to 2x per day (morning/night)    -Probiotics   - Blebs are a disruption of the microbiome    If not resolving with conservative measures    -Triamcinolone  0.1% topically 3-5x per day - just to the bleb using a toothpick and cover with wax or parchment paper.     It doesn't immediately improve the pain, but it does help resolve the bleb by breaking up the biofilm causing the blockage to the nipple pore.  It can be quite painful.      Dr. Woodie Glatter:  [email or web address]

## 2024-05-31 NOTE — Consults
 Patient  1. 38 y.o. G1P1001 @ [redacted]w[redacted]d s/p SVD   2. H/o beta-2 glycoprotein1 Ab  3. HSV  4. IDA  5. Lumbar disc herniation  6. Mild scoliosis      Babyis 4 mo female ''Layla''   - AGA  Birth Weight: 7 lb 4.4 oz (3300 g)   ~ 13 lbs 8oz at last visit this week     Problem:  Mastitis   - 3x over the first 3 months   - Feels like it may be coming on again   - OB said to talk to us  again   - breasts are comfortable today     Feeding Info:     Breast:            0x  Bottles:           6x                   5oz  Pump:             5x                 33-40oz - 6.5-13oz per sessions  Sleep:  8-9 hours    Stash:  Bought  a separate freezer for milk  - 500oz     Impression: Inflammatory mastitis in the context of Hyperlactation  d/t excessive pumping and inconsistent pump schedule.        #mastitis   - Tx with Abx   - Never had a high fever   - Chills and felt hot    #exclusive pumping   - 5x a day for the last 4-8weeks    - 20 min   - Large breasts     #back to work   - Works from home    #blebs   - Changed flange size on the    - Right breast / also feels more lumpy - that is the side   -  Sunflower seed lecithin.    - Hydrogels      Patient Consent to Telehealth   The patient agreed to participate in the video visit prior to joining the visit.

## 2024-06-14 ENCOUNTER — Other Ambulatory Visit: Payer: BLUE CROSS/BLUE SHIELD

## 2024-07-24 ENCOUNTER — Other Ambulatory Visit: Payer: PRIVATE HEALTH INSURANCE
